# Patient Record
Sex: Female | Born: 1960 | Race: White | Hispanic: No | Smoking: Former smoker
Health system: Southern US, Community
[De-identification: ages and names within clinical notes are randomized; demographics above are authoritative.]

## PROBLEM LIST (undated history)

## (undated) DIAGNOSIS — R0600 Dyspnea, unspecified: Secondary | ICD-10-CM

## (undated) DIAGNOSIS — E785 Hyperlipidemia, unspecified: Secondary | ICD-10-CM

## (undated) DIAGNOSIS — F419 Anxiety disorder, unspecified: Secondary | ICD-10-CM

## (undated) DIAGNOSIS — I10 Essential (primary) hypertension: Secondary | ICD-10-CM

## (undated) DIAGNOSIS — M1990 Unspecified osteoarthritis, unspecified site: Secondary | ICD-10-CM

## (undated) DIAGNOSIS — T7840XA Allergy, unspecified, initial encounter: Secondary | ICD-10-CM

## (undated) DIAGNOSIS — E119 Type 2 diabetes mellitus without complications: Secondary | ICD-10-CM

## (undated) DIAGNOSIS — N83209 Unspecified ovarian cyst, unspecified side: Secondary | ICD-10-CM

## (undated) DIAGNOSIS — F32A Depression, unspecified: Secondary | ICD-10-CM

## (undated) DIAGNOSIS — K219 Gastro-esophageal reflux disease without esophagitis: Secondary | ICD-10-CM

## (undated) DIAGNOSIS — I2510 Atherosclerotic heart disease of native coronary artery without angina pectoris: Secondary | ICD-10-CM

## (undated) DIAGNOSIS — F329 Major depressive disorder, single episode, unspecified: Secondary | ICD-10-CM

## (undated) HISTORY — DX: Hyperlipidemia, unspecified: E78.5

## (undated) HISTORY — DX: Depression, unspecified: F32.A

## (undated) HISTORY — DX: Unspecified ovarian cyst, unspecified side: N83.209

## (undated) HISTORY — PX: COLONOSCOPY: SHX174

## (undated) HISTORY — DX: Allergy, unspecified, initial encounter: T78.40XA

## (undated) HISTORY — DX: Unspecified osteoarthritis, unspecified site: M19.90

## (undated) HISTORY — DX: Major depressive disorder, single episode, unspecified: F32.9

## (undated) HISTORY — DX: Essential (primary) hypertension: I10

## (undated) HISTORY — PX: WISDOM TOOTH EXTRACTION: SHX21

## (undated) HISTORY — DX: Anxiety disorder, unspecified: F41.9

## (undated) HISTORY — PX: JOINT REPLACEMENT: SHX530

---

## 2001-02-23 ENCOUNTER — Encounter: Payer: Self-pay | Admitting: Family Medicine

## 2001-02-23 ENCOUNTER — Encounter: Admission: RE | Admit: 2001-02-23 | Discharge: 2001-02-23 | Payer: Self-pay | Admitting: Family Medicine

## 2005-03-20 ENCOUNTER — Ambulatory Visit: Payer: Self-pay | Admitting: Internal Medicine

## 2005-08-20 ENCOUNTER — Encounter: Admission: RE | Admit: 2005-08-20 | Discharge: 2005-08-20 | Payer: Self-pay | Admitting: Family Medicine

## 2007-01-24 ENCOUNTER — Ambulatory Visit (HOSPITAL_COMMUNITY): Admission: RE | Admit: 2007-01-24 | Discharge: 2007-01-24 | Payer: Self-pay | Admitting: Obstetrics and Gynecology

## 2007-01-24 ENCOUNTER — Encounter (HOSPITAL_COMMUNITY): Payer: Self-pay | Admitting: Obstetrics and Gynecology

## 2008-03-22 ENCOUNTER — Encounter: Admission: RE | Admit: 2008-03-22 | Discharge: 2008-03-22 | Payer: Self-pay | Admitting: Family Medicine

## 2010-01-05 HISTORY — PX: ENDOMETRIAL ABLATION: SHX621

## 2010-01-05 HISTORY — PX: OVARIAN CYST REMOVAL: SHX89

## 2010-05-20 NOTE — Op Note (Signed)
NAMEMARLAYNA, [name]               ACCOUNT NO.:  [id number]   MEDICAL RECORD NO.:  [id number]          PATIENT TYPE:  AMB   LOCATION:  SDC                           FACILITY:  [facility]   PHYSICIAN:  Zelphia Cairo, MD    DATE OF BIRTH:  03-Jan-1961   DATE OF PROCEDURE:  01/24/2007  DATE OF DISCHARGE:                               OPERATIVE REPORT   PREOPERATIVE DIAGNOSES:  1. Irregular menses and menorrhagia.  2. Persistent right ovarian cyst.   POSTOPERATIVE DIAGNOSES:  1. Irregular menses and menorrhagia.  2. Persistent right ovarian cyst.  Pathology pending.   SURGERY:  Laparoscopic right salpingo-oophorectomy, hysteroscopy D&C  with NovaSure ablation, paracervical block.   SURGEON:  Zelphia Cairo, M.D.   ANESTHESIA:  General, local.   COMPLICATIONS:  None.   CONDITION:  Stable and extubated to recovery room.   PROCEDURE:  Ariana [name] was taken to the operating room where general  anesthesia was found to be adequate.  She was placed in the dorsal  lithotomy position using Allen stirrups.  She was prepped and draped in  sterile fashion, and a catheter was used turned to drain her bladder for  approximately 50 mL of clear urine.  Bivalve speculum was placed in the  vagina, and a single-tooth tenaculum was used to grasp the anterior lip  of the cervix.  A Hulka uterine manipulator was then placed.  A single-  toothed tenaculum and speculum were then removed, and our attention was  turned to the abdomen.   An infraumbilical skin incision was made with a scalpel, and this was  extended bluntly to the fascia using a Kelly clamp.  An optical trocar  was then used to enter the peritoneum under direct visualization.  Once  intraperitoneal placement was confirmed, CO2 was turned on, and the  patient was placed in Trendelenburg position.  The bowel was swept out  of the cul-de-sac, and a survey of the pelvis was obtained.  Appendix  and left ovary appeared normal.  The right ovary had a  large simple-  appearing cyst consistent with ultrasound.  A suprapubic incision was  then made with the scalpel ,and a 5-mm trocar was inserted under direct  visualization.  A blunt grasper was then inserted through this trocar,  and the right adnexa was tented upwards.  The right fallopian tube and  ovary were then visualized to be distant from the ureter.  The ureter  was noted to be free of our operative field.  The gyrus was then  inserted through the operative port.  The utero-ovarian ligament was  grasped, cauterized and cut using the gyrus.  This incision was then  extended along the level of the mesosalpinx, staying just adjacent to  the fallopian tube and ovary.  Once the fallopian tube and ovary were  free, they were placed in the anterior cul-de-sac.  The blunt grasper  was then used to grasp the right adnexa, and long laparoscopic scissors  were placed through the operative port.  Small incisions were made  through the ovary and ovarian cyst.  The blunt grasper was then  removed,  and a 10-mm port was then used to replace our 5-mm suprapubic port.  An  EndoCatch bag was placed in the pelvis.  The right adnexa was placed  into the EndoCatch bag, and this was removed through our suprapubic  incision.  This was passed off and sent to pathology.  The pelvis was  then irrigated using the Nezhat, and our pedicle was visualized to be  hemostatic.  All trocars and instruments were then removed from the  abdomen.  Fascial incisions were closed using a figure-of-eight Vicryl  stitch, and the skin was closed with 3-0 Vicryl in a running  subcutaneous stitch.  The abdomen was then covered, and our attention  was then turned to the vagina.   A Hulka manipulator was removed from the cervix.  A speculum was placed  in the vagina, and a single-tooth tenaculum placed on the anterior lip  of the cervix.  The uterus was sounded to 8 cm.  The diagnostic  hysteroscope was inserted into the  uterine cavity, and the uterine  cavity was visualized.  She was found to have small polypoid mass just  inside the cervical internal os.  The uterine cavity otherwise appeared  normal.  The hysteroscope was then removed, and a D&C was performed.  Specimen was placed on Telfa and passed off to be sent to pathology.  The NovaSure device was then inserted into the uterine cavity, and an  ablation was performed using standard manufacturer guidelines.  NovaSure  device was then removed from the uterus.  Cervical block was then  performed.  A single-tooth tenaculum was removed from the cervix.  The  cervix was found to be hemostatic.  The speculum was removed.  The  patient was extubated and taken to the recovery room in stable  condition.      Zelphia Cairo, MD  Electronically Signed     GA/MEDQ  D:  01/24/2007  T:  01/24/2007  Job:  [id number]

## 2010-09-25 LAB — CBC
HCT: 36.7
Hemoglobin: 12.4
MCHC: 33.6
MCV: 79.8
Platelets: 332
RBC: 4.61
RDW: 15.7 — ABNORMAL HIGH
WBC: 7.9

## 2010-09-25 LAB — BASIC METABOLIC PANEL
BUN: 10
CO2: 30
Chloride: 101
Glucose, Bld: 73
Potassium: 3.8

## 2010-09-25 LAB — PREGNANCY, URINE: Preg Test, Ur: NEGATIVE

## 2012-08-28 LAB — HM MAMMOGRAPHY: HM Mammogram: NORMAL (ref 0–4)

## 2012-08-28 LAB — HM PAP SMEAR

## 2015-08-28 ENCOUNTER — Telehealth: Payer: Self-pay | Admitting: Emergency Medicine

## 2015-08-28 ENCOUNTER — Encounter: Payer: Self-pay | Admitting: Emergency Medicine

## 2015-08-28 NOTE — Telephone Encounter (Signed)
Pre-Visit Call completed with patient and chart updated.   Pre-Visit Info documented in Specialty Comments under [employer].    

## 2015-08-29 ENCOUNTER — Ambulatory Visit (INDEPENDENT_AMBULATORY_CARE_PROVIDER_SITE_OTHER): Payer: BC Managed Care – PPO | Admitting: Family Medicine

## 2015-08-29 ENCOUNTER — Encounter: Payer: Self-pay | Admitting: Family Medicine

## 2015-08-29 VITALS — BP 130/82 | HR 83 | Temp 98.1°F | Ht 63.75 in | Wt 254.4 lb

## 2015-08-29 DIAGNOSIS — F418 Other specified anxiety disorders: Secondary | ICD-10-CM | POA: Diagnosis not present

## 2015-08-29 DIAGNOSIS — I10 Essential (primary) hypertension: Secondary | ICD-10-CM | POA: Diagnosis not present

## 2015-08-29 DIAGNOSIS — F32A Depression, unspecified: Secondary | ICD-10-CM | POA: Insufficient documentation

## 2015-08-29 DIAGNOSIS — Z23 Encounter for immunization: Secondary | ICD-10-CM

## 2015-08-29 DIAGNOSIS — F329 Major depressive disorder, single episode, unspecified: Secondary | ICD-10-CM | POA: Insufficient documentation

## 2015-08-29 DIAGNOSIS — E785 Hyperlipidemia, unspecified: Secondary | ICD-10-CM | POA: Diagnosis not present

## 2015-08-29 DIAGNOSIS — F419 Anxiety disorder, unspecified: Secondary | ICD-10-CM

## 2015-08-29 DIAGNOSIS — I519 Heart disease, unspecified: Secondary | ICD-10-CM | POA: Insufficient documentation

## 2015-08-29 DIAGNOSIS — E7800 Pure hypercholesterolemia, unspecified: Secondary | ICD-10-CM | POA: Insufficient documentation

## 2015-08-29 LAB — HEPATIC FUNCTION PANEL
ALK PHOS: 86 U/L (ref 39–117)
ALT: 25 U/L (ref 0–35)
AST: 20 U/L (ref 0–37)
Albumin: 4.4 g/dL (ref 3.5–5.2)
BILIRUBIN DIRECT: 0.1 mg/dL (ref 0.0–0.3)
BILIRUBIN TOTAL: 0.4 mg/dL (ref 0.2–1.2)
TOTAL PROTEIN: 7.4 g/dL (ref 6.0–8.3)

## 2015-08-29 LAB — CBC WITH DIFFERENTIAL/PLATELET
BASOS PCT: 0.3 % (ref 0.0–3.0)
Basophils Absolute: 0 10*3/uL (ref 0.0–0.1)
EOS PCT: 0 % (ref 0.0–5.0)
Eosinophils Absolute: 0 10*3/uL (ref 0.0–0.7)
HCT: 41 % (ref 36.0–46.0)
Hemoglobin: 13.9 g/dL (ref 12.0–15.0)
LYMPHS ABS: 2.2 10*3/uL (ref 0.7–4.0)
Lymphocytes Relative: 26.3 % (ref 12.0–46.0)
MCHC: 33.8 g/dL (ref 30.0–36.0)
MCV: 80.8 fl (ref 78.0–100.0)
MONOS PCT: 7.3 % (ref 3.0–12.0)
Monocytes Absolute: 0.6 10*3/uL (ref 0.1–1.0)
NEUTROS ABS: 5.6 10*3/uL (ref 1.4–7.7)
Neutrophils Relative %: 66.1 % (ref 43.0–77.0)
Platelets: 304 10*3/uL (ref 150.0–400.0)
RBC: 5.07 Mil/uL (ref 3.87–5.11)
RDW: 15.4 % (ref 11.5–15.5)
WBC: 8.5 10*3/uL (ref 4.0–10.5)

## 2015-08-29 LAB — BASIC METABOLIC PANEL
BUN: 15 mg/dL (ref 6–23)
CHLORIDE: 101 meq/L (ref 96–112)
CO2: 30 mEq/L (ref 19–32)
Calcium: 9.6 mg/dL (ref 8.4–10.5)
Creatinine, Ser: 0.75 mg/dL (ref 0.40–1.20)
GFR: 84.68 mL/min (ref >60.00)
Glucose, Bld: 63 mg/dL — ABNORMAL LOW (ref 70–99)
POTASSIUM: 3.6 meq/L (ref 3.5–5.1)
Sodium: 141 mEq/L (ref 135–145)

## 2015-08-29 LAB — LIPID PANEL
CHOL/HDL RATIO: 4
CHOLESTEROL: 176 mg/dL (ref 0–200)
HDL: 47.8 mg/dL (ref >39.00)
NonHDL: 128.61
Triglycerides: 223 mg/dL — ABNORMAL HIGH (ref 0.0–149.0)
VLDL: 44.6 mg/dL — ABNORMAL HIGH (ref 0.0–40.0)

## 2015-08-29 LAB — HEMOGLOBIN A1C: HEMOGLOBIN A1C: 5.3 % (ref 4.6–6.5)

## 2015-08-29 LAB — LDL CHOLESTEROL, DIRECT: Direct LDL: 99 mg/dL

## 2015-08-29 LAB — TSH: TSH: 1.32 u[IU]/mL (ref 0.35–4.50)

## 2015-08-29 MED ORDER — SIMVASTATIN 40 MG PO TABS: 40.0000 mg | ORAL_TABLET | Freq: Every day | ORAL | 1 refills | Status: DC

## 2015-08-29 MED ORDER — ALPRAZOLAM 0.5 MG PO TABS: 0.5000 mg | ORAL_TABLET | Freq: Two times a day (BID) | ORAL | 1 refills | Status: DC | PRN

## 2015-08-29 MED ORDER — BUPROPION HCL ER (XL) 300 MG PO TB24: 300.0000 mg | ORAL_TABLET | Freq: Every day | ORAL | 1 refills | Status: DC

## 2015-08-29 MED ORDER — CITALOPRAM HYDROBROMIDE 20 MG PO TABS: 20.0000 mg | ORAL_TABLET | Freq: Every day | ORAL | 3 refills | Status: DC

## 2015-08-29 MED ORDER — AMLODIPINE BESYLATE 5 MG PO TABS: 5.0000 mg | ORAL_TABLET | Freq: Every day | ORAL | 1 refills | Status: DC

## 2015-08-29 MED ORDER — HYDROCHLOROTHIAZIDE 25 MG PO TABS: 25.0000 mg | ORAL_TABLET | Freq: Every day | ORAL | 1 refills | Status: DC

## 2015-08-29 NOTE — Progress Notes (Signed)
   Subjective:    Patient ID: Ariana [name], female    DOB: 06/16/1960, 55 y.o.   MRN: [id number]  HPI New to establish.  Previous MD- [place], due for pap/mammo  HTN- chronic problem, on Amlodipine and HCTZ daily w/ adequate control of BP.  No CP, SOB, HAs, visual changes, edema.  Hyperlipidemia- chronic problem, on Simvastatin.  No abd pain, N/V.  Anxiety/depression- chronic problem, sxs started after her daughter died at age 98 in [place].  Currently on Wellbutrin and Alprazolam prn.  Struggling w/ mom's ongoing dementia.  Pt finds herself getting angry w/ mother.  Increased irritability.  Was on Cymbalta previously w/ no results.    Obesity- chronic problem, pt's BMI is now 41.  Pt has lost 40 lbs in the last year.  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  Pt is attempting Flexogenix prior to surgery.   Review of Systems For ROS see HPI     Objective:   Physical Exam  Constitutional: She is oriented to person, place, and time. She appears well-developed and well-nourished. No distress.  obese  HENT:  Head: Normocephalic and atraumatic.  Eyes: Conjunctivae and EOM are normal. Pupils are equal, round, and reactive to light.  Neck: Normal range of motion. Neck supple. No thyromegaly present.  Cardiovascular: Normal rate, regular rhythm, normal heart sounds and intact distal pulses.   No murmur heard. Pulmonary/Chest: Effort normal and breath sounds normal. No respiratory distress.  Abdominal: Soft. She exhibits no distension. There is no tenderness.  Musculoskeletal: She exhibits no edema.  Lymphadenopathy:    She has no cervical adenopathy.  Neurological: She is alert and oriented to person, place, and time.  Skin: Skin is warm and dry.  Psychiatric: She has a normal mood and affect. Her behavior is normal.  Vitals reviewed.         Assessment & Plan:

## 2015-08-29 NOTE — Patient Instructions (Signed)
Follow up in 4-6 weeks to recheck anxiety We'll notify you of your lab results and make any changes if needed Continue to work on healthy diet and regular exercise- you can do it! Call and schedule an appt w/ Dr Julien Girt Start the Celexa once daily in addition to the Wellbutrin Call with any questions or concerns Welcome!  We're glad to have you!!!

## 2015-08-29 NOTE — Progress Notes (Signed)
Pre visit review using our clinic review tool, if applicable. No additional management support is needed unless otherwise documented below in the visit note. 

## 2015-08-30 ENCOUNTER — Encounter: Payer: Self-pay | Admitting: General Practice

## 2015-09-01 NOTE — Assessment & Plan Note (Signed)
New to provider, ongoing for pt since death of her daughter in 04-25-1999.  sxs have worsened w/ mom's dementia (pt is primary caregiver).  Add Celexa to pt's current Wellbutrin and monitor for improvement.  Pt expressed understanding and is in agreement w/ plan.

## 2015-09-01 NOTE — Assessment & Plan Note (Signed)
New to provider, ongoing for pt.  BP is well controlled today.  Asymptomatic.  Check labs.  No anticipated med changes.  Will follow.

## 2015-09-01 NOTE — Assessment & Plan Note (Signed)
New to provider, ongoing for pt.  She has recently lost 40 lbs and is attempting to lose more.  Applauded her efforts.  Check labs to risk stratify.  Will continue to follow.

## 2015-09-01 NOTE — Assessment & Plan Note (Signed)
New to provider, ongoing for pt.  Tolerating statin w/o difficulty.  Stressed need for healthy diet and regular exercise.  Check labs.  Adjust meds prn  

## 2015-09-30 ENCOUNTER — Ambulatory Visit: Payer: BC Managed Care – PPO | Admitting: Family Medicine

## 2015-10-03 ENCOUNTER — Ambulatory Visit (INDEPENDENT_AMBULATORY_CARE_PROVIDER_SITE_OTHER): Payer: BC Managed Care – PPO | Admitting: Family Medicine

## 2015-10-03 ENCOUNTER — Encounter: Payer: Self-pay | Admitting: Family Medicine

## 2015-10-03 VITALS — BP 122/82 | HR 63 | Temp 98.1°F | Resp 16 | Ht 64.0 in | Wt 258.4 lb

## 2015-10-03 DIAGNOSIS — F329 Major depressive disorder, single episode, unspecified: Secondary | ICD-10-CM

## 2015-10-03 DIAGNOSIS — F418 Other specified anxiety disorders: Secondary | ICD-10-CM

## 2015-10-03 DIAGNOSIS — F419 Anxiety disorder, unspecified: Principal | ICD-10-CM

## 2015-10-03 MED ORDER — FLUOXETINE HCL 20 MG PO TABS: 20.0000 mg | ORAL_TABLET | Freq: Every day | ORAL | 3 refills | Status: DC

## 2015-10-03 NOTE — Progress Notes (Signed)
Pre visit review using our clinic review tool, if applicable. No additional management support is needed unless otherwise documented below in the visit note. 

## 2015-10-03 NOTE — Patient Instructions (Signed)
Follow up in 4-6 weeks to recheck mood and weight STOP the Celexa START the Prozac once daily Keep up the good work!  You look great!!! Call with any questions or concerns Happy Bel[employer] Birthday!!!

## 2015-10-03 NOTE — Assessment & Plan Note (Signed)
Improved since starting Celexa but pt is concerned about increased appetite and weight gain.  Based on this, will switch to Prozac and monitor for improvement.  Pt expressed understanding and is in agreement w/ plan.

## 2015-10-03 NOTE — Progress Notes (Signed)
   Subjective:    Patient ID: Ariana [name], female    DOB: Jan 15, 1960, 55 y.o.   MRN: [id number]  HPI Anxiety- ongoing issue.  Celexa was added to pt's Wellbutrin at last visit.  Pt reports she is feeling 'better'.  Pt reports increased tolerance, longer fuse when dealing w/ mother's dementia.  The only downside is increased appetite.     Review of Systems For ROS see HPI     Objective:   Physical Exam  Constitutional: She is oriented to person, place, and time. She appears well-developed and well-nourished. No distress.  HENT:  Head: Normocephalic and atraumatic.  Neurological: She is alert and oriented to person, place, and time.  Skin: Skin is warm and dry.  Psychiatric: She has a normal mood and affect. Her behavior is normal. Thought content normal.  Vitals reviewed.         Assessment & Plan:

## 2015-10-31 ENCOUNTER — Encounter: Payer: Self-pay | Admitting: Family Medicine

## 2015-10-31 ENCOUNTER — Ambulatory Visit (INDEPENDENT_AMBULATORY_CARE_PROVIDER_SITE_OTHER): Payer: BC Managed Care – PPO | Admitting: Family Medicine

## 2015-10-31 VITALS — BP 122/80 | HR 72 | Temp 98.3°F | Resp 16 | Ht 64.0 in | Wt 258.0 lb

## 2015-10-31 DIAGNOSIS — J302 Other seasonal allergic rhinitis: Secondary | ICD-10-CM | POA: Diagnosis not present

## 2015-10-31 DIAGNOSIS — F418 Other specified anxiety disorders: Secondary | ICD-10-CM

## 2015-10-31 DIAGNOSIS — F329 Major depressive disorder, single episode, unspecified: Secondary | ICD-10-CM

## 2015-10-31 DIAGNOSIS — J309 Allergic rhinitis, unspecified: Secondary | ICD-10-CM | POA: Insufficient documentation

## 2015-10-31 DIAGNOSIS — F419 Anxiety disorder, unspecified: Principal | ICD-10-CM

## 2015-10-31 MED ORDER — FLUOXETINE HCL 40 MG PO CAPS: 40.0000 mg | ORAL_CAPSULE | Freq: Every day | ORAL | 3 refills | Status: DC

## 2015-10-31 NOTE — Assessment & Plan Note (Signed)
Pt has not had any additional weight gain since switching to Prozac.  Will increase to 40mg  and monitor for continued improvement

## 2015-10-31 NOTE — Progress Notes (Signed)
   Subjective:    Patient ID: Ariana [name], female    DOB: 07-24-1960, 55 y.o.   MRN: [id number]  HPI Anxiety/depression- ongoing issue for pt.  Pt has not had any weight gain since stopping the Celexa and switching to the Prozac.  Pt feels mood is 'about the same' as it was on the Celexa.  'i'm handling situations better'.  Pt is open to idea of increasing dose for additional improvement  URI- sxs started Tuesday w/ sore throat.  Yesterday developed laryngitis and didn't feel well.  Today is feeling better but voice has yet to recover.  No fevers.  No sinus pain/pressure.     Review of Systems For ROS see HPI     Objective:   Physical Exam  Constitutional: She is oriented to person, place, and time. She appears well-developed and well-nourished. No distress.  HENT:  Head: Normocephalic and atraumatic.  Right Ear: Tympanic membrane normal.  Left Ear: Tympanic membrane normal.  Nose: Mucosal edema and rhinorrhea present. Right sinus exhibits no maxillary sinus tenderness and no frontal sinus tenderness. Left sinus exhibits no maxillary sinus tenderness and no frontal sinus tenderness.  Mouth/Throat: Mucous membranes are normal. Posterior oropharyngeal erythema (w/ PND) present.  Eyes: Conjunctivae and EOM are normal. Pupils are equal, round, and reactive to light.  Neck: Normal range of motion. Neck supple.  Cardiovascular: Normal rate, regular rhythm and normal heart sounds.   Pulmonary/Chest: Effort normal and breath sounds normal. No respiratory distress. She has no wheezes. She has no rales.  Lymphadenopathy:    She has no cervical adenopathy.  Neurological: She is alert and oriented to person, place, and time.  Psychiatric: She has a normal mood and affect. Her behavior is normal. Thought content normal.  Vitals reviewed.         Assessment & Plan:

## 2015-10-31 NOTE — Assessment & Plan Note (Signed)
New.  Pt's sxs and PE consistent w/ seasonal allergies.  Start OTC antihistamine daily.  Reviewed supportive care and red flags that should prompt return.  Pt expressed understanding and is in agreement w/ plan.

## 2015-10-31 NOTE — Progress Notes (Signed)
Pre visit review using our clinic review tool, if applicable. No additional management support is needed unless otherwise documented below in the visit note. 

## 2015-10-31 NOTE — Patient Instructions (Addendum)
Follow up by phone or MyChart in 4-6 weeks and let me know how you're doing on the new dose Schedule your complete physical for late Feb/Early March Increase the Prozac to 40mg  daily- 2 of what you have at home and 1 of the new prescription Add daily Claritin or Zyrtec for the seasonal allergy component Drink plenty of fluids Call with any questions or concerns Happy Fall!!!

## 2016-03-05 ENCOUNTER — Ambulatory Visit (INDEPENDENT_AMBULATORY_CARE_PROVIDER_SITE_OTHER): Payer: BC Managed Care – PPO | Admitting: Family Medicine

## 2016-03-05 ENCOUNTER — Encounter: Payer: Self-pay | Admitting: Family Medicine

## 2016-03-05 VITALS — BP 122/80 | HR 62 | Temp 98.2°F | Resp 16 | Ht 64.0 in | Wt 263.2 lb

## 2016-03-05 DIAGNOSIS — Z01419 Encounter for gynecological examination (general) (routine) without abnormal findings: Secondary | ICD-10-CM

## 2016-03-05 DIAGNOSIS — Z1211 Encounter for screening for malignant neoplasm of colon: Secondary | ICD-10-CM | POA: Diagnosis not present

## 2016-03-05 DIAGNOSIS — Z1159 Encounter for screening for other viral diseases: Secondary | ICD-10-CM | POA: Diagnosis not present

## 2016-03-05 DIAGNOSIS — Z0000 Encounter for general adult medical examination without abnormal findings: Secondary | ICD-10-CM | POA: Diagnosis not present

## 2016-03-05 LAB — LIPID PANEL
Cholesterol: 168 mg/dL (ref <200)
HDL: 48 mg/dL — AB (ref >50)
LDL CALC: 83 mg/dL (ref <100)
Total CHOL/HDL Ratio: 3.5 Ratio (ref <5.0)
Triglycerides: 184 mg/dL — ABNORMAL HIGH (ref <150)
VLDL: 37 mg/dL — ABNORMAL HIGH (ref <30)

## 2016-03-05 LAB — BASIC METABOLIC PANEL
BUN: 17 mg/dL (ref 7–25)
CALCIUM: 9.4 mg/dL (ref 8.6–10.4)
CHLORIDE: 104 mmol/L (ref 98–110)
CO2: 25 mmol/L (ref 20–31)
Creat: 0.96 mg/dL (ref 0.50–1.05)
GLUCOSE: 65 mg/dL (ref 65–99)
POTASSIUM: 3.7 mmol/L (ref 3.5–5.3)
SODIUM: 140 mmol/L (ref 135–146)

## 2016-03-05 LAB — CBC WITH DIFFERENTIAL/PLATELET
BASOS ABS: 0 {cells}/uL (ref 0–200)
BASOS PCT: 0 %
EOS ABS: 0 {cells}/uL — AB (ref 15–500)
Eosinophils Relative: 0 %
HEMATOCRIT: 40.5 % (ref 35.0–45.0)
Hemoglobin: 13.3 g/dL (ref 11.7–15.5)
LYMPHS PCT: 24 %
Lymphs Abs: 1896 cells/uL (ref 850–3900)
MCH: 26.6 pg — ABNORMAL LOW (ref 27.0–33.0)
MCHC: 32.8 g/dL (ref 32.0–36.0)
MCV: 81 fL (ref 80.0–100.0)
MONOS PCT: 8 %
MPV: 10.1 fL (ref 7.5–12.5)
Monocytes Absolute: 632 cells/uL (ref 200–950)
Neutro Abs: 5372 cells/uL (ref 1500–7800)
Neutrophils Relative %: 68 %
PLATELETS: 269 10*3/uL (ref 140–400)
RBC: 5 MIL/uL (ref 3.80–5.10)
RDW: 15 % (ref 11.0–15.0)
WBC: 7.9 10*3/uL (ref 3.8–10.8)

## 2016-03-05 LAB — HEPATIC FUNCTION PANEL
ALK PHOS: 75 U/L (ref 33–130)
ALT: 25 U/L (ref 6–29)
AST: 30 U/L (ref 10–35)
Albumin: 4 g/dL (ref 3.6–5.1)
BILIRUBIN INDIRECT: 0.3 mg/dL (ref 0.2–1.2)
Bilirubin, Direct: 0.1 mg/dL (ref <=0.2)
TOTAL PROTEIN: 7 g/dL (ref 6.1–8.1)
Total Bilirubin: 0.4 mg/dL (ref 0.2–1.2)

## 2016-03-05 LAB — TSH: TSH: 2.63 mIU/L

## 2016-03-05 NOTE — Patient Instructions (Signed)
Follow up in 6 months to recheck BP and cholesterol We'll notify you of your lab results and make any changes if needed We'll call you with your GI and GYN appts for the colonoscopy and the pap/mammo Continue to work on healthy diet and regular exercise- you can do it!!! Call with any questions or concerns Happy Spring!!!

## 2016-03-05 NOTE — Progress Notes (Signed)
   Subjective:    Patient ID: Ariana [name], female    DOB: 1960-06-06, 56 y.o.   MRN: [id number]  HPI CPE- due for pap and mammo (sees Dr Julien Girt).  Overdue for colonoscopy   Review of Systems Patient reports no vision/ hearing changes, adenopathy,fever, weight change,  persistant/recurrent hoarseness , swallowing issues, chest pain, palpitations, edema, persistant/recurrent cough, hemoptysis, dyspnea (rest/exertional/paroxysmal nocturnal), gastrointestinal bleeding (melena, rectal bleeding), abdominal pain, significant heartburn, bowel changes, GU symptoms (dysuria, hematuria, incontinence), Gyn symptoms (abnormal  bleeding, pain),  syncope, focal weakness, memory loss, numbness & tingling, skin/hair/nail changes, abnormal bruising or bleeding, anxiety, or depression.     Objective:   Physical Exam General Appearance:    Alert, cooperative, no distress, appears stated age, morbidly obese  Head:    Normocephalic, without obvious abnormality, atraumatic  Eyes:    PERRL, conjunctiva/corneas clear, EOM's intact, fundi    benign, both eyes  Ears:    Normal TM's and external ear canals, both ears  Nose:   Nares normal, septum midline, mucosa normal, no drainage    or sinus tenderness  Throat:   Lips, mucosa, and tongue normal; teeth and gums normal  Neck:   Supple, symmetrical, trachea midline, no adenopathy;    Thyroid: no enlargement/tenderness/nodules  Back:     Symmetric, no curvature, ROM normal, no CVA tenderness  Lungs:     Clear to auscultation bilaterally, respirations unlabored  Chest Wall:    No tenderness or deformity   Heart:    Regular rate and rhythm, S1 and S2 normal, no murmur, rub   or gallop  Breast Exam:    Deferred to GYN  Abdomen:     Soft, non-tender, bowel sounds active all four quadrants,    no masses, no organomegaly  Genitalia:    Deferred to GYN  Rectal:    Extremities:   Extremities normal, atraumatic, no cyanosis or edema  Pulses:   2+ and symmetric all  extremities  Skin:   Skin color, texture, turgor normal, no rashes or lesions  Lymph nodes:   Cervical, supraclavicular, and axillary nodes normal  Neurologic:   CNII-XII intact, normal strength, sensation and reflexes    throughout          Assessment & Plan:

## 2016-03-05 NOTE — Progress Notes (Signed)
Pre visit review using our clinic review tool, if applicable. No additional management support is needed unless otherwise documented below in the visit note. 

## 2016-03-05 NOTE — Assessment & Plan Note (Signed)
Pt's PE WNL w/ exception of morbid obesity.  Stressed need for healthy diet and regular exercise.  Overdue for colonoscopy- GI referral placed.  Due for pap and mammo- GYN referral placed as pt sees Dr Julien Girt.  Check labs.  Anticipatory guidance provided.

## 2016-03-06 ENCOUNTER — Other Ambulatory Visit: Payer: Self-pay | Admitting: General Practice

## 2016-03-06 LAB — VITAMIN D 25 HYDROXY (VIT D DEFICIENCY, FRACTURES): Vit D, 25-Hydroxy: 12 ng/mL — ABNORMAL LOW (ref 30–100)

## 2016-03-06 LAB — HEPATITIS C ANTIBODY: HCV Ab: NEGATIVE

## 2016-03-06 MED ORDER — VITAMIN D (ERGOCALCIFEROL) 1.25 MG (50000 UNIT) PO CAPS: 50000.0000 [IU] | ORAL_CAPSULE | ORAL | 0 refills | Status: DC

## 2016-03-09 ENCOUNTER — Encounter: Payer: Self-pay | Admitting: Gastroenterology

## 2016-03-10 ENCOUNTER — Other Ambulatory Visit: Payer: Self-pay | Admitting: Family Medicine

## 2016-03-10 NOTE — Telephone Encounter (Signed)
Last OV 03/05/16 Alprazolam last filled 08/29/15 #60 with 1

## 2016-03-10 NOTE — Telephone Encounter (Signed)
Medication filled to pharmacy as requested.   

## 2016-03-16 ENCOUNTER — Encounter: Payer: BC Managed Care – PPO | Admitting: Family Medicine

## 2016-04-02 ENCOUNTER — Other Ambulatory Visit: Payer: Self-pay | Admitting: Obstetrics and Gynecology

## 2016-04-02 DIAGNOSIS — R928 Other abnormal and inconclusive findings on diagnostic imaging of breast: Secondary | ICD-10-CM

## 2016-04-08 ENCOUNTER — Ambulatory Visit
Admission: RE | Admit: 2016-04-08 | Discharge: 2016-04-08 | Disposition: A | Discharge status: Home / Self Care | Payer: BC Managed Care – PPO | Source: Ambulatory Visit | Attending: Obstetrics and Gynecology | Admitting: Obstetrics and Gynecology

## 2016-04-08 DIAGNOSIS — R928 Other abnormal and inconclusive findings on diagnostic imaging of breast: Secondary | ICD-10-CM

## 2016-05-04 ENCOUNTER — Encounter: Payer: BC Managed Care – PPO | Admitting: Gastroenterology

## 2016-05-26 ENCOUNTER — Encounter: Payer: Self-pay | Admitting: Gastroenterology

## 2016-05-26 ENCOUNTER — Ambulatory Visit (AMBULATORY_SURGERY_CENTER): Payer: Self-pay

## 2016-05-26 VITALS — Ht 63.0 in | Wt 264.4 lb

## 2016-05-26 DIAGNOSIS — Z1211 Encounter for screening for malignant neoplasm of colon: Secondary | ICD-10-CM

## 2016-05-26 MED ORDER — NA SULFATE-K SULFATE-MG SULF 17.5-3.13-1.6 GM/177ML PO SOLN: ORAL | 0 refills | Status: DC

## 2016-05-26 NOTE — Progress Notes (Signed)
Per pt, no allergies to soy or egg products.Pt not taking any weight loss meds or using  O2 at home.  Pr refused Emmi video.

## 2016-06-09 ENCOUNTER — Ambulatory Visit (AMBULATORY_SURGERY_CENTER): Payer: BC Managed Care – PPO | Admitting: Gastroenterology

## 2016-06-09 ENCOUNTER — Encounter: Payer: Self-pay | Admitting: Gastroenterology

## 2016-06-09 VITALS — BP 122/65 | HR 67 | Temp 97.7°F | Resp 19 | Ht 63.0 in | Wt 264.0 lb

## 2016-06-09 DIAGNOSIS — Z1212 Encounter for screening for malignant neoplasm of rectum: Secondary | ICD-10-CM

## 2016-06-09 DIAGNOSIS — K649 Unspecified hemorrhoids: Secondary | ICD-10-CM | POA: Diagnosis not present

## 2016-06-09 DIAGNOSIS — Z1211 Encounter for screening for malignant neoplasm of colon: Secondary | ICD-10-CM | POA: Diagnosis present

## 2016-06-09 DIAGNOSIS — D124 Benign neoplasm of descending colon: Secondary | ICD-10-CM | POA: Diagnosis not present

## 2016-06-09 MED ORDER — SODIUM CHLORIDE 0.9 % IV SOLN: 500.0000 mL | INTRAVENOUS | Status: DC

## 2016-06-09 NOTE — Progress Notes (Signed)
A and O x3. Report to RN. Tolerated MAC anesthesia well.

## 2016-06-09 NOTE — Patient Instructions (Signed)
HANDOUTS GIVEN FOR POLYPS AND HEMORRHOIDS  YOU HAD AN ENDOSCOPIC PROCEDURE TODAY AT THE [facility]:   Refer to the procedure report that was given to you for any specific questions about what was found during the examination.  If the procedure report does not answer your questions, please call your gastroenterologist to clarify.  If you requested that your care partner not be given the details of your procedure findings, then the procedure report has been included in a sealed envelope for you to review at your convenience later.  YOU SHOULD EXPECT: Some feelings of bloating in the abdomen. Passage of more gas than usual.  Walking can help get rid of the air that was put into your GI tract during the procedure and reduce the bloating. If you had a lower endoscopy (such as a colonoscopy or flexible sigmoidoscopy) you may notice spotting of blood in your stool or on the toilet paper. If you underwent a bowel prep for your procedure, you may not have a normal bowel movement for a few days.  Please Note:  You might notice some irritation and congestion in your nose or some drainage.  This is from the oxygen used during your procedure.  There is no need for concern and it should clear up in a day or so.  SYMPTOMS TO REPORT IMMEDIATELY:   Following lower endoscopy (colonoscopy or flexible sigmoidoscopy):  Excessive amounts of blood in the stool  Significant tenderness or worsening of abdominal pains  Swelling of the abdomen that is new, acute  Fever of 100F or higher   For urgent or emergent issues, a gastroenterologist can be reached at any hour by calling [phone].   DIET:  We do recommend a small meal at first, but then you may proceed to your regular diet.  Drink plenty of fluids but you should avoid alcoholic beverages for 24 hours.  ACTIVITY:  You should plan to take it easy for the rest of today and you should NOT DRIVE or use heavy machinery until tomorrow (because of the  sedation medicines used during the test).    FOLLOW UP: Our staff will call the number listed on your records the next business day following your procedure to check on you and address any questions or concerns that you may have regarding the information given to you following your procedure. If we do not reach you, we will leave a message.  However, if you are feeling well and you are not experiencing any problems, there is no need to return our call.  We will assume that you have returned to your regular daily activities without incident.  If any biopsies were taken you will be contacted by phone or by letter within the next 1-3 weeks.  Please call us at [phone] if you have not heard about the biopsies in 3 weeks.    SIGNATURES/CONFIDENTIALITY: You and/or your care partner have signed paperwork which will be entered into your electronic medical record.  These signatures attest to the fact that that the information above on your After Visit Summary has been reviewed and is understood.  Full responsibility of the confidentiality of this discharge information lies with you and/or your care-partner. 

## 2016-06-09 NOTE — Progress Notes (Signed)
Called to room to assist during endoscopic procedure.  Patient ID and intended procedure confirmed with present staff. Received instructions for my participation in the procedure from the performing physician.  

## 2016-06-09 NOTE — Op Note (Signed)
[facility] Patient Name: Ariana [name] Procedure Date: 06/09/2016 8:43 AM MRN: [id number] Endoscopist: Milus Banister , MD Age: 56 Referring MD:  Date of Birth: [birth year] Gender: Female Account #: [id number] Procedure:                Colonoscopy Indications:              Screening for colorectal malignant neoplasm Medicines:                Monitored Anesthesia Care Procedure:                Pre-Anesthesia Assessment:                           - Prior to the procedure, a History and Physical                            was performed, and patient medications and                            allergies were reviewed. The patient's tolerance of                            previous anesthesia was also reviewed. The risks                            and benefits of the procedure and the sedation                            options and risks were discussed with the patient.                            All questions were answered, and informed consent                            was obtained. Prior Anticoagulants: The patient has                            taken no previous anticoagulant or antiplatelet                            agents. ASA Grade Assessment: III - A patient with                            severe systemic disease. After reviewing the risks                            and benefits, the patient was deemed in                            satisfactory condition to undergo the procedure.                           After obtaining informed consent, the colonoscope  was passed under direct vision. Throughout the                            procedure, the patient's blood pressure, pulse, and                            oxygen saturations were monitored continuously. The                            Colonoscope was introduced through the anus and                            advanced to the the cecum, identified by                            appendiceal orifice and  ileocecal valve. The                            colonoscopy was performed without difficulty. The                            patient tolerated the procedure well. The quality                            of the bowel preparation was excellent. The                            ileocecal valve, appendiceal orifice, and rectum                            were photographed. Scope In: 8:50:39 AM Scope Out: 9:03:29 AM Scope Withdrawal Time: 0 hours 10 minutes 43 seconds  Total Procedure Duration: 0 hours 12 minutes 50 seconds  Findings:                 A 5 mm polyp was found in the descending colon. The                            polyp was sessile. The polyp was removed with a                            cold snare. Resection and retrieval were complete.                           Internal hemorrhoids were found. The hemorrhoids                            were small.                           The exam was otherwise without abnormality on                            direct and retroflexion views. Complications:  No immediate complications. Estimated blood loss:                            None. Estimated Blood Loss:     Estimated blood loss: none. Impression:               - One 5 mm polyp in the descending colon, removed                            with a cold snare. Resected and retrieved.                           - Internal hemorrhoids.                           - The examination was otherwise normal on direct                            and retroflexion views. Recommendation:           - Patient has a contact number available for                            emergencies. The signs and symptoms of potential                            delayed complications were discussed with the                            patient. Return to normal activities tomorrow.                            Written discharge instructions were provided to the                            patient.                           -  Resume previous diet.                           - Continue present medications.                           You will receive a letter within 2-3 weeks with the                            pathology results and my final recommendations.                           If the polyp(s) is proven to be 'pre-cancerous' on                            pathology, you will need repeat colonoscopy in 5  years. If the polyp(s) is NOT 'precancerous' on                            pathology then you should repeat colon cancer                            screening in 10 years with colonoscopy without need                            for colon cancer screening by any method prior to                            then (including stool testing). Milus Banister, MD 06/09/2016 9:08:12 AM This report has been signed electronically.

## 2016-06-10 ENCOUNTER — Telehealth: Payer: Self-pay | Admitting: *Deleted

## 2016-06-10 NOTE — Telephone Encounter (Signed)
  Follow up Call-  Call back number 06/09/2016  Post procedure Call Back phone  # [phone]  Permission to leave phone message Yes  Some recent data might be hidden     Patient questions:  Do you have a fever, pain , or abdominal swelling? No. Pain Score  0 *  Have you tolerated food without any problems? Yes.    Have you been able to return to your normal activities? Yes.    Do you have any questions about your discharge instructions: Diet   No. Medications  No. Follow up visit  No.  Do you have questions or concerns about your Care? No.  Actions: * If pain score is 4 or above: No action needed, pain <4.

## 2016-06-16 ENCOUNTER — Encounter: Payer: Self-pay | Admitting: Gastroenterology

## 2016-07-22 LAB — HM MAMMOGRAPHY: HM MAMMO: NORMAL (ref 0–4)

## 2016-07-22 LAB — HM PAP SMEAR

## 2016-07-28 ENCOUNTER — Encounter: Payer: Self-pay | Admitting: Physician Assistant

## 2016-07-28 ENCOUNTER — Ambulatory Visit (INDEPENDENT_AMBULATORY_CARE_PROVIDER_SITE_OTHER): Payer: BC Managed Care – PPO | Admitting: Physician Assistant

## 2016-07-28 VITALS — BP 130/72 | HR 77 | Temp 98.9°F | Resp 14 | Ht 63.0 in | Wt 271.0 lb

## 2016-07-28 DIAGNOSIS — L509 Urticaria, unspecified: Secondary | ICD-10-CM | POA: Diagnosis not present

## 2016-07-28 DIAGNOSIS — R609 Edema, unspecified: Secondary | ICD-10-CM | POA: Diagnosis not present

## 2016-07-28 LAB — T4, FREE: FREE T4: 0.94 ng/dL (ref 0.60–1.60)

## 2016-07-28 LAB — TSH: TSH: 2.98 u[IU]/mL (ref 0.35–4.50)

## 2016-07-28 MED ORDER — FUROSEMIDE 20 MG PO TABS: 20.0000 mg | ORAL_TABLET | Freq: Every day | ORAL | 0 refills | Status: DC

## 2016-07-28 NOTE — Progress Notes (Signed)
Patient presents to clinic today c/o hives/welts of upper chest, upper back and neck that have been intermittent over the past couple of weeks. Notes can happen at any time of the day, regardless of where she is or what she is doing. Hives are very prutiric and resolve within 30 minutes to an hour. Denies change to foods, soaps/lotions/detergents, environment. Denies pets. Denies recent travel or sick contacts. Has also noted some swelling in her legs mainly towards the ned of her day. Has had a couple of days of weeping of skin noted. Denies pain or fever in the area. Denies having fever, chills, malaise or fatigue. Notes eating fast food 2-3 x day as she has been very busy helping to take care of her mother which leaves little time for herself. Denies chest pain, SOB, PND or orthopnea. Is taking chronic medications as directed.  Past Medical History:  Diagnosis Date  . Anxiety   . Depression   . Hyperlipidemia   . Hypertension   . OA (osteoarthritis)    in Bil knees/ gets steroid injections for pain  . Ovarian cyst    right ovary/had ablation    Current Outpatient Prescriptions on File Prior to Visit  Medication Sig Dispense Refill  . ALPRAZolam (XANAX) 0.5 MG tablet TAKE 1 TABLET TWICE DAILY AS NEEDED FOR ANXIETY 60 tablet 1  . amLODipine (NORVASC) 5 MG tablet TAKE 1 TABLET (5 MG TOTAL) BY MOUTH DAILY. 90 tablet 1  . buPROPion (WELLBUTRIN XL) 300 MG 24 hr tablet TAKE 1 TABLET (300 MG TOTAL) BY MOUTH DAILY. 90 tablet 1  . FLUoxetine (PROZAC) 40 MG capsule TAKE 1 CAPSULE (40 MG TOTAL) BY MOUTH DAILY. 30 capsule 3  . hydrochlorothiazide (HYDRODIURIL) 25 MG tablet TAKE 1 TABLET (25 MG TOTAL) BY MOUTH DAILY. 90 tablet 1  . ibuprofen (ADVIL,MOTRIN) 200 MG tablet Take 200 mg by mouth as needed.    . Naproxen Sodium (ALEVE PO) Take by mouth as needed.    . simvastatin (ZOCOR) 40 MG tablet TAKE 1 TABLET (40 MG TOTAL) BY MOUTH DAILY. 90 tablet 1   No current facility-administered medications on  file prior to visit.     Allergies  Allergen Reactions  . Penicillins Hives    Family History  Problem Relation Age of Onset  . Dementia Mother   . Heart attack Father   . Heart disease Father   . Arthritis Father   . Hyperlipidemia Father   . Hypertension Father   . Diabetes Brother   . Arthritis Brother   . Heart disease Brother   . Liver disease Brother     Social History   Social History  . Marital status: Legally Separated    Spouse name: N/A  . Number of children: N/A  . Years of education: N/A   Social History Main Topics  . Smoking status: Never Smoker  . Smokeless tobacco: Never Used  . Alcohol use No  . Drug use: No  . Sexual activity: No   Other Topics Concern  . None   Social History Narrative  . None   Review of Systems - See HPI.  All other ROS are negative.  BP 130/72   Pulse 77   Temp 98.9 F (37.2 C) (Oral)   Resp 14   Ht 5\' 3"  (1.6 m)   Wt 271 lb (122.9 kg)   SpO2 97%   BMI 48.01 kg/m   Physical Exam  Constitutional: She is oriented to person, place, and time  and well-developed, well-nourished, and in no distress.  HENT:  Head: Normocephalic and atraumatic.  Eyes: Conjunctivae are normal.  Neck: Neck supple.  Cardiovascular: Normal rate, regular rhythm, normal heart sounds and intact distal pulses.   Pulses:      Dorsalis pedis pulses are 2+ on the right side, and 2+ on the left side.       Posterior tibial pulses are 2+ on the right side, and 2+ on the left side.  1+ pitting edema of extremities bilaterally from shins down to feet.   Pulmonary/Chest: Effort normal and breath sounds normal. No respiratory distress. She has no wheezes. She has no rales. She exhibits no tenderness.  Neurological: She is alert and oriented to person, place, and time.  Skin: Skin is warm and dry. No rash noted.  Vitals reviewed.  Assessment/Plan: 1. Peripheral edema Will check TSH and Free T4 to verify stable thyroid function. Most likely related  to very poor diet (fast food 2-3 x day). Start low salt diet. Elevation of legs. Continue chronic medication regimen. Add on Lasix 20 mg QD x 7 days. Increase potassium intake. Follow-up 1 week.  - TSH - T4, free - furosemide (LASIX) 20 MG tablet; Take 1 tablet (20 mg total) by mouth daily.  Dispense: 7 tablet; Refill: 0  2. Urticaria None presently. Concern for allergic versus idiopathic urticaria. Start claritin AM and Benadryl PM. Patient to keep log of foods, lotions and symptoms. Follow-up 1 week for reassessment.    Leeanne Rio, PA-C

## 2016-07-28 NOTE — Progress Notes (Signed)
Pre visit review using our clinic review tool, if applicable. No additional management support is needed unless otherwise documented below in the visit note. 

## 2016-07-28 NOTE — Patient Instructions (Addendum)
Please go to the lab for blood work. I will call you with your results.  Stay hydrated but limit salt intake!  Stop the fast food as it is very bad for you! Follow the diet below.   Please start a Claritin each morning and Benadryl at night over the next week. See if this helps with the hives. Keep a food/activity/symptoms journal to help [place] see if we can pick out any pattern to symptoms.   Continue chronicmedications as directed but start the Lasix once daily over the next week. Increase potassium intake.   Follow-up 1 week for reassessment. If there is any worsening of symptoms, please return sooner or go to the ER.   I believe we are dealing with two different issues here.

## 2016-08-03 ENCOUNTER — Encounter: Payer: Self-pay | Admitting: Family Medicine

## 2016-08-03 ENCOUNTER — Ambulatory Visit (INDEPENDENT_AMBULATORY_CARE_PROVIDER_SITE_OTHER): Payer: BC Managed Care – PPO | Admitting: Family Medicine

## 2016-08-03 VITALS — BP 122/82 | HR 79 | Temp 98.1°F | Resp 16 | Ht 63.0 in | Wt 267.4 lb

## 2016-08-03 DIAGNOSIS — L509 Urticaria, unspecified: Secondary | ICD-10-CM | POA: Diagnosis not present

## 2016-08-03 DIAGNOSIS — R609 Edema, unspecified: Secondary | ICD-10-CM

## 2016-08-03 DIAGNOSIS — R600 Localized edema: Secondary | ICD-10-CM | POA: Diagnosis not present

## 2016-08-03 LAB — BASIC METABOLIC PANEL
BUN: 16 mg/dL (ref 6–23)
CHLORIDE: 101 meq/L (ref 96–112)
CO2: 31 meq/L (ref 19–32)
Calcium: 9.7 mg/dL (ref 8.4–10.5)
Creatinine, Ser: 0.82 mg/dL (ref 0.40–1.20)
GFR: 76.14 mL/min (ref >60.00)
GLUCOSE: 79 mg/dL (ref 70–99)
Potassium: 3.3 mEq/L — ABNORMAL LOW (ref 3.5–5.1)
SODIUM: 139 meq/L (ref 135–145)

## 2016-08-03 MED ORDER — FUROSEMIDE 20 MG PO TABS: 20.0000 mg | ORAL_TABLET | Freq: Every day | ORAL | 3 refills | Status: DC

## 2016-08-03 NOTE — Assessment & Plan Note (Signed)
Resolved.  Pt has not had any hives since starting Claritin.  Continue daily antihistamine.

## 2016-08-03 NOTE — Assessment & Plan Note (Signed)
Improved since starting Lasix last week.  No longer having bullae or weeping.  Check BMP due to start of Lasix and refill provided.  Reviewed supportive care and red flags that should prompt return.  Pt expressed understanding and is in agreement w/ plan.

## 2016-08-03 NOTE — Patient Instructions (Signed)
Follow up as needed/scheduled We'll notify you of your lab results and make any changes if needed Continue your Lasix (water pill) once daily as long as legs are swollen Continue to elevate your legs, drink plenty of fluids, and limit your salt/sodium intake Continue the Claritin once daily to prevent hives Call with any questions or concerns Enjoy the rest of your summer!!!

## 2016-08-03 NOTE — Progress Notes (Signed)
Pre visit review using our clinic review tool, if applicable. No additional management support is needed unless otherwise documented below in the visit note. 

## 2016-08-03 NOTE — Progress Notes (Signed)
   Subjective:    Patient ID: Ariana [name], female    DOB: 11-27-1960, 56 y.o.   MRN: [id number]  HPI Edema- pt was started on Lasix 20mg  at last visit.  Pt is down 4 lbs sine 7/24.  Pt reports legs are '100% better' than last week.    Urticaria- hives 'are better'.  Pt reports she has not had any recently.  Continues to take claritin.     Review of Systems For ROS see HPI     Objective:   Physical Exam  Constitutional: She is oriented to person, place, and time. She appears well-developed and well-nourished. No distress.  obese  HENT:  Head: Normocephalic and atraumatic.  Eyes: Pupils are equal, round, and reactive to light. Conjunctivae and EOM are normal.  Neck: Normal range of motion. Neck supple. No thyromegaly present.  Cardiovascular: Normal rate, regular rhythm, normal heart sounds and intact distal pulses.   No murmur heard. Pulmonary/Chest: Effort normal and breath sounds normal. No respiratory distress.  Abdominal: Soft. She exhibits no distension. There is no tenderness.  Musculoskeletal: She exhibits edema (trace to 1+ edema bilaterally). She exhibits no tenderness.  Lymphadenopathy:    She has no cervical adenopathy.  Neurological: She is alert and oriented to person, place, and time.  Skin: Skin is warm and dry. No rash (no urticaria present today) noted.  Scattered excoriations on R leg  Psychiatric: She has a normal mood and affect. Her behavior is normal.  Vitals reviewed.         Assessment & Plan:

## 2016-08-04 ENCOUNTER — Other Ambulatory Visit: Payer: Self-pay | Admitting: General Practice

## 2016-08-04 MED ORDER — POTASSIUM CHLORIDE CRYS ER 20 MEQ PO TBCR: 20.0000 meq | EXTENDED_RELEASE_TABLET | Freq: Every day | ORAL | 3 refills | Status: DC

## 2016-10-05 ENCOUNTER — Ambulatory Visit (INDEPENDENT_AMBULATORY_CARE_PROVIDER_SITE_OTHER): Payer: BC Managed Care – PPO | Admitting: Family Medicine

## 2016-10-05 ENCOUNTER — Encounter: Payer: Self-pay | Admitting: General Practice

## 2016-10-05 ENCOUNTER — Encounter: Payer: Self-pay | Admitting: Family Medicine

## 2016-10-05 VITALS — BP 124/82 | HR 80 | Temp 99.2°F | Resp 16 | Ht 63.0 in | Wt 272.5 lb

## 2016-10-05 DIAGNOSIS — M7918 Myalgia, other site: Secondary | ICD-10-CM

## 2016-10-05 MED ORDER — PREDNISONE 10 MG PO TABS: ORAL_TABLET | ORAL | 0 refills | Status: DC

## 2016-10-05 MED ORDER — CYCLOBENZAPRINE HCL 10 MG PO TABS: 10.0000 mg | ORAL_TABLET | Freq: Three times a day (TID) | ORAL | 0 refills | Status: DC | PRN

## 2016-10-05 NOTE — Progress Notes (Signed)
Pre visit review using our clinic review tool, if applicable. No additional management support is needed unless otherwise documented below in the visit note. 

## 2016-10-05 NOTE — Progress Notes (Signed)
   Subjective:    Patient ID: Ariana [name], female    DOB: 1960/04/15, 56 y.o.   MRN: [id number]  HPI Back/hip pain- L sided.  sxs started in July when pt became sole caregiver for elderly mother and was doing all of her transfers.  Pt reports she had pain all through August and September.  Pain worsens w/ standing.  Pain is located over PSIS and glute and will radiate around laterally to front.  Pt has tried Advil, Tylenol, Aleve w/o relief.  Mild, temporary relief w/ ice or heat.     Review of Systems For ROS see HPI     Objective:   Physical Exam  Constitutional: She is oriented to person, place, and time. She appears well-developed and well-nourished. No distress.  Morbidly obese  Musculoskeletal: She exhibits tenderness (TTP over L glute w/ obvious spasm).  Mild SLR on L, (-) on R  Neurological: She is alert and oriented to person, place, and time. No cranial nerve deficit. Coordination normal.  Skin: Skin is warm and dry.  Psychiatric: She has a normal mood and affect. Her behavior is normal. Thought content normal.  Vitals reviewed.         Assessment & Plan:  Gluteal pain- new.  Pt has not had relief w/ OTC NSAIDs or tylenol.  Pt has obvious gluteal spasm and likely inflammation from heavy lifting most of this summer.  Start prednisone taper.  Flexeril for spasm.  Refer to PT for evaluation/treatment.  Reviewed supportive care and red flags that should prompt return.  Pt expressed understanding and is in agreement w/ plan.

## 2016-10-05 NOTE — Patient Instructions (Signed)
Follow up as needed or as scheduled Start the Prednisone as directed- 3 pills at the same time x3 days, then 2 pills at the same time x3 days, and then 1 pill x3 days.  Take w/ food Use the Flexeril nightly for spasm Continue to heat! We'll call you with your physical therapy appointment Call with any questions or concerns Hang in there!!!

## 2016-10-11 ENCOUNTER — Other Ambulatory Visit: Payer: Self-pay | Admitting: Family Medicine

## 2016-10-12 NOTE — Telephone Encounter (Signed)
Last OV 10/05/16 (gluteal pain) Alprazolam last filled 03/10/16 #60 with 1

## 2016-10-12 NOTE — Telephone Encounter (Signed)
Medication filled to pharmacy as requested.   

## 2016-10-13 ENCOUNTER — Ambulatory Visit (INDEPENDENT_AMBULATORY_CARE_PROVIDER_SITE_OTHER): Payer: BC Managed Care – PPO | Admitting: Physical Therapy

## 2016-10-13 DIAGNOSIS — R2689 Other abnormalities of gait and mobility: Secondary | ICD-10-CM | POA: Diagnosis not present

## 2016-10-13 DIAGNOSIS — M25552 Pain in left hip: Secondary | ICD-10-CM | POA: Diagnosis not present

## 2016-10-13 NOTE — Therapy (Signed)
[facility] [address] [place], [place], [address] Phone: [phone]   Fax:  [phone]  Physical Therapy Evaluation  Patient Details  Name: Ariana [name] MRN: [id number] Date of Birth: 03/05/1960 Referring Provider: Dr. Dimple Nanas  Encounter Date: 10/13/2016      PT End of Session - 10/13/16 1515    Visit Number 1   Number of Visits 12   Date for PT Re-Evaluation 11/24/16   Authorization Type BCBS   PT Start Time 1430   PT Stop Time 1503   PT Time Calculation (min) 33 min   Activity Tolerance Patient tolerated treatment well   Behavior During Therapy [facility] for tasks assessed/performed      Past Medical History:  Diagnosis Date  . Anxiety   . Depression   . Hyperlipidemia   . Hypertension   . OA (osteoarthritis)    in Bil knees/ gets steroid injections for pain  . Ovarian cyst    right ovary/had ablation    Past Surgical History:  Procedure Laterality Date  . ENDOMETRIAL ABLATION  2012  . OVARIAN CYST REMOVAL Right 2012    There were no vitals filed for this visit.       Subjective Assessment - 10/13/16 1433    Subjective Pt is a 56 y/o female who presents to [facility] for increased pain in Lt low back and glute.  Pt attributes pain to having to provide total care to mother including lifting and transferring pt.  Pt reports now that Hospice is involved, she is only helping to roll pt side to side.     Pertinent History OA in bil knees (followed by Dr. Alvan Dame)   Limitations Standing;Walking;Lifting   How long can you stand comfortably? 10 min   How long can you walk comfortably? 10-15 min (but still uncomfortable with all amb)   Patient Stated Goals improve pain   Currently in Pain? Yes   Pain Score 3   up to 10/10   Pain Location Buttocks   Pain Orientation Left   Pain Descriptors / Indicators Dull   Pain Type Acute pain   Pain Onset More than a month ago   Pain Frequency Constant   Aggravating Factors   standing, walking, transitional movements   Pain Relieving Factors nothing            [facility] PT Assessment - 10/13/16 1438      Assessment   Medical Diagnosis Gluteal Pain; Lt side   Referring Provider Dr. Dimple Nanas   Onset Date/Surgical Date --  Aug 2018   Next MD Visit PRN   Prior Therapy none     Precautions   Precautions None     Restrictions   Weight Bearing Restrictions No     Balance Screen   Has the patient fallen in the past 6 months No   Has the patient had a decrease in activity level because of a fear of falling?  No   Is the patient reluctant to leave their home because of a fear of falling?  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, otherwise seated mostly   Leisure nothing     Cognition   Overall Cognitive Status Within Functional Limits for tasks assessed     ROM / Strength   AROM / PROM / Strength Strength;AROM     AROM   Overall AROM Comments lumbar ROM WNL (see pain below)   AROM Assessment Site Lumbar   Lumbar Flexion end range pain   Lumbar Extension end range pain   Lumbar - Right Side Bend "pulling" sensation   Lumbar - Left Side Bend increased pain   Lumbar - Right Rotation "pulling" sensation   Lumbar - Left Rotation increased pain     Strength   Strength Assessment Site Hip;Knee;Ankle   Right/Left Hip Right;Left   Right Hip Flexion 4/5   Right Hip ABduction 5/5   Left Hip Flexion 3/5  with pain   Left Hip Extension 4/5   Left Hip ABduction 4/5   Right/Left Knee Right;Left   Right Knee Extension 5/5   Left Knee Extension 5/5   Right/Left Ankle Right;Left    Right Ankle Dorsiflexion 5/5   Left Ankle Dorsiflexion 5/5     Flexibility   Soft Tissue Assessment /Muscle Length yes   Hamstrings tightness on Lt   Piriformis tightness on Lt   Quadratus Lumborum tightness on Lt     Palpation   Palpation comment significant tenderness over Lt glut med/max and near piriformis     Ambulation/Gait   Gait Pattern Step-to pattern;Antalgic            Objective measurements completed on examination: See above findings.          [facility] Adult PT Treatment/Exercise - 10/13/16 1438      Self-Care   Self-Care Other Self-Care Comments   Other Self-Care Comments  instructed in self STM and myofascial release with ball and instructed on where to purchase     Exercises   Exercises Lumbar     Lumbar Exercises: Stretches   Single Knee to Chest Stretch 2 reps;30 seconds   Lower Trunk Rotation 2 reps;30 seconds   Piriformis Stretch 2 reps;30 seconds                PT Education - 10/13/16 1514    Education provided Yes   Education Details DN, HEP, use of ball for self STM   Person(s) Educated Patient   Methods Explanation;Demonstration;Handout   Comprehension Verbalized understanding;Returned demonstration;Need further instruction             PT Long Term Goals - 10/13/16 1522      PT LONG TERM GOAL #1   Title independent with HEP   Status New   Target Date 11/24/16     PT LONG TERM GOAL #2   Title perform lumbar ROM without increase in pain for improved function and mobility   Status New   Target Date 11/24/16     PT LONG TERM GOAL #3   Title report ability to stand and walk > 25 min without increase in pain for improved function and mobility   Status New   Target Date 11/24/16                Plan - 10/13/16 1517    Clinical Impression Statement Pt is a 57 y/o female who presents to [facility] for Lt sided buttock pain x 2 months, likely due to providing total care to mother.  Symptoms consistent with muscle  strain and pt demonstrates tenderness and active  trigger points in glut max and med.  Will benefit from PT to address deficits and maximize function.   History and Personal Factors relevant to plan of care: obesity, OA   Clinical Presentation Stable   Clinical Decision Making Low   Rehab Potential Good   PT Frequency 2x / week   PT Duration 6 weeks   PT Treatment/Interventions ADLs/Self Care Home Management;Cryotherapy;Electrical Stimulation;Moist Heat;Ultrasound;Therapeutic exercise;Therapeutic activities;Functional mobility training;Gait training;Patient/family education;Dry needling;Taping;Manual techniques   PT Next Visit Plan review HEP, manual and DN to Lt glut, modalities PRN   Consulted and Agree with Plan of Care Patient      Patient will benefit from skilled therapeutic intervention in order to improve the following deficits and impairments:  Abnormal gait, Increased fascial restricitons, Increased muscle spasms, Difficulty walking, Decreased mobility, Decreased strength, Impaired flexibility, Pain  Visit Diagnosis: Pain in left hip - Plan: PT plan of care cert/re-cert  Other abnormalities of gait and mobility - Plan: PT plan of care cert/re-cert     Problem List Patient Active Problem List   Diagnosis Date Noted  . Peripheral edema 07/28/2016  . Urticaria 07/28/2016  . Physical exam 03/05/2016  . Allergic rhinitis 10/31/2015  . Severe obesity (BMI >= 40) ([place]) 08/29/2015  . Anxiety and depression 08/29/2015  . HTN (hypertension) 08/29/2015  . Hyperlipidemia 08/29/2015     Laureen Abrahams, PT, DPT 10/13/16 3:26 PM    [facility] [place], [place], [address] Phone: [phone]   Fax:  [phone]  Name: DUSTY WAGONER MRN: [id number] Date of Birth: 09-04-1960

## 2016-10-13 NOTE — Patient Instructions (Signed)
Trigger Point Dry Needling  . What is Trigger Point Dry Needling (DN)? o DN is a physical therapy technique used to treat muscle pain and dysfunction. Specifically, DN helps deactivate muscle trigger points (muscle knots).  o A thin filiform needle is used to penetrate the skin and stimulate the underlying trigger point. The goal is for a local twitch response (LTR) to occur and for the trigger point to relax. No medication of any kind is injected during the procedure.   . What Does Trigger Point Dry Needling Feel Like?  o The procedure feels different for each individual patient. Some patients report that they do not actually feel the needle enter the skin and overall the process is not painful. Very mild bleeding may occur. However, many patients feel a deep cramping in the muscle in which the needle was inserted. This is the local twitch response.   [place] Kitchen How Will I feel after the treatment? o Soreness is normal, and the onset of soreness may not occur for a few hours. Typically this soreness does not last longer than two days.  o Bruising is uncommon, however; ice can be used to decrease any possible bruising.  o In rare cases feeling tired or nauseous after the treatment is normal. In addition, your symptoms may get worse before they get better, this period will typically not last longer than 24 hours.   . What Can I do After My Treatment? o Increase your hydration by drinking more water for the next 24 hours. o You may place ice or heat on the areas treated that have become sore, however, do not use heat on inflamed or bruised areas. Heat often brings more relief post needling. o You can continue your regular activities, but vigorous activity is not recommended initially after the treatment for 24 hours. o DN is best combined with other physical therapy such as strengthening, stretching, and other therapies.      Piriformis (Supine)   Cross legs, left on top. Gently pull left knee towards  right shoulder until stretch is felt in buttock/hip of top leg. Use a towel if needed.  Hold _30___ seconds. Repeat ___3_ times per set.  Do __2__ sessions per day.    Lower Trunk Rotation Stretch   Keeping back flat and feet together, rotate knees to left side. Hold ___30_ seconds. Repeat __3_ times per set. . Do __2__ sessions per day.    Knee to Chest (Flexion)    Pull knee toward chest. Feel stretch in lower back or buttock area. Breathing deeply, Hold _20-30___ seconds. Repeat with other knee. Repeat __2-3__ times. Do _2-3___ sessions per day.    TARGET BALL:  Turn towards the main section of the store and head towards the cards.  Turn at the cards and head towards the back of the store.  Just past the home goods before you get to the toys you will see an end cap with "party favor" type toys.  The ball is located in one of these bins.  Look for the softball sized ball that lights up.  It should be around $5.

## 2016-10-19 ENCOUNTER — Ambulatory Visit (INDEPENDENT_AMBULATORY_CARE_PROVIDER_SITE_OTHER): Payer: BC Managed Care – PPO | Admitting: Physical Therapy

## 2016-10-19 DIAGNOSIS — M25552 Pain in left hip: Secondary | ICD-10-CM | POA: Diagnosis not present

## 2016-10-19 DIAGNOSIS — R2689 Other abnormalities of gait and mobility: Secondary | ICD-10-CM | POA: Diagnosis not present

## 2016-10-19 NOTE — Therapy (Signed)
[facility] [address] [place], [place], [address] Phone: [phone]   Fax:  [phone]  Physical Therapy Treatment  Patient Details  Name: Ariana [name] MRN: [id number] Date of Birth: 09/13/1960 Referring Provider: Dr. Dimple Nanas  Encounter Date: 10/19/2016      PT End of Session - 10/19/16 1047    Visit Number 2   Number of Visits 12   Date for PT Re-Evaluation 11/24/16   Authorization Type BCBS   PT Start Time 1016   PT Stop Time 1100   PT Time Calculation (min) 44 min   Activity Tolerance Patient tolerated treatment well   Behavior During Therapy [facility] for tasks assessed/performed      Past Medical History:  Diagnosis Date  . Anxiety   . Depression   . Hyperlipidemia   . Hypertension   . OA (osteoarthritis)    in Bil knees/ gets steroid injections for pain  . Ovarian cyst    right ovary/had ablation    Past Surgical History:  Procedure Laterality Date  . ENDOMETRIAL ABLATION  2012  . OVARIAN CYST REMOVAL Right 2012    There were no vitals filed for this visit.      Subjective Assessment - 10/19/16 1036    Subjective feeling better, still having some soreness but much better.   Patient Stated Goals improve pain   Currently in Pain? Yes   Pain Score 3    Pain Location Buttocks   Pain Orientation Left   Pain Descriptors / Indicators Dull   Pain Type Acute pain   Pain Onset More than a month ago   Pain Frequency Constant   Aggravating Factors  standing, walking, transitional movements   Pain Relieving Factors stretches and ball                         [facility] Adult PT Treatment/Exercise - 10/19/16 1037      Lumbar Exercises: Stretches   Single Knee to Chest Stretch 2 reps;30 seconds   Single Knee to Chest Stretch Limitations Lt side   Lower Trunk Rotation 2 reps;30 seconds   Lower Trunk Rotation Limitations to Rt for Lt side stretch   Piriformis Stretch 2 reps;30 seconds   Piriformis Stretch Limitations Lt side     Modalities   Modalities Moist Heat;Electrical Stimulation     Moist Heat Therapy   Number Minutes Moist Heat 15 Minutes   Moist Heat Location Hip  Lt     Electrical Stimulation   Electrical Stimulation Location Lt hip   Electrical Stimulation Action IFC   Electrical Stimulation Parameters to tolerance in sidelying x 15 min   Electrical Stimulation Goals Pain     Manual Therapy   Manual Therapy Soft tissue mobilization   Manual therapy comments pt sidelying   Soft tissue mobilization Lt glutes          Trigger Point Dry Needling - 10/19/16 1046    Consent Given? Yes   Education Handout Provided Yes   Muscles Treated Lower Body Gluteus maximus   Gluteus Maximus Response Twitch response elicited;Palpable increased muscle length              PT Education - 10/19/16 1047    Education provided Yes   Education Details verbally reviewed DN   Person(s) Educated Patient   Methods Explanation   Comprehension Verbalized understanding             PT Long Term  Goals - 10/13/16 1522      PT LONG TERM GOAL #1   Title independent with HEP   Status New   Target Date 11/24/16     PT LONG TERM GOAL #2   Title perform lumbar ROM without increase in pain for improved function and mobility   Status New   Target Date 11/24/16     PT LONG TERM GOAL #3   Title report ability to stand and walk > 25 min without increase in pain for improved function and mobility   Status New   Target Date 11/24/16               Plan - 10/19/16 1047    Clinical Impression Statement Pt reports improvement in symptoms but continues to have some pain expecially with prolonged walking and activity.  DN performed today with manual therapy to help decrease active trigger points and pt reports decreased soreness following.  Pt with ice burn due to contact with ice pack directly on skin on Lt buttock, so avoided this area of needling and manual  today.  Advised to use barrier between self and ice pack going forward.  Pt verbalized understanding.   PT Treatment/Interventions ADLs/Self Care Home Management;Cryotherapy;Electrical Stimulation;Moist Heat;Ultrasound;Therapeutic exercise;Therapeutic activities;Functional mobility training;Gait training;Patient/family education;Dry needling;Taping;Manual techniques   PT Next Visit Plan continue manual and stretches, assess response to needling, modalities PRN   Consulted and Agree with Plan of Care Patient      Patient will benefit from skilled therapeutic intervention in order to improve the following deficits and impairments:  Abnormal gait, Increased fascial restricitons, Increased muscle spasms, Difficulty walking, Decreased mobility, Decreased strength, Impaired flexibility, Pain  Visit Diagnosis: Pain in left hip  Other abnormalities of gait and mobility     Problem List Patient Active Problem List   Diagnosis Date Noted  . Peripheral edema 07/28/2016  . Urticaria 07/28/2016  . Physical exam 03/05/2016  . Allergic rhinitis 10/31/2015  . Severe obesity (BMI >= 40) ([place]) 08/29/2015  . Anxiety and depression 08/29/2015  . HTN (hypertension) 08/29/2015  . Hyperlipidemia 08/29/2015      Laureen Abrahams, PT, DPT 10/19/16 10:53 AM    [place], [place], [address] Phone: [phone]   Fax:  [phone]  Name: Ariana [name] MRN: [id number] Date of Birth: 10/14/1960

## 2016-10-22 ENCOUNTER — Ambulatory Visit (INDEPENDENT_AMBULATORY_CARE_PROVIDER_SITE_OTHER): Payer: BC Managed Care – PPO | Admitting: Physical Therapy

## 2016-10-22 DIAGNOSIS — M25552 Pain in left hip: Secondary | ICD-10-CM

## 2016-10-22 DIAGNOSIS — R2689 Other abnormalities of gait and mobility: Secondary | ICD-10-CM | POA: Diagnosis not present

## 2016-10-22 NOTE — Therapy (Signed)
[facility] [address] [place], [place], [address] Phone: [phone]   Fax:  [phone]  Physical Therapy Treatment  Patient Details  Name: Ariana [name] MRN: [id number] Date of Birth: [birth year] Referring Provider: Dr. Dimple Nanas  Encounter Date: 10/22/2016      PT End of Session - 10/22/16 1000    Visit Number 3   Number of Visits 12   Date for PT Re-Evaluation 11/24/16   Authorization Type BCBS   PT Start Time 0930   PT Stop Time 1013   PT Time Calculation (min) 43 min   Activity Tolerance Patient tolerated treatment well   Behavior During Therapy [facility] for tasks assessed/performed      Past Medical History:  Diagnosis Date  . Anxiety   . Depression   . Hyperlipidemia   . Hypertension   . OA (osteoarthritis)    in Bil knees/ gets steroid injections for pain  . Ovarian cyst    right ovary/had ablation    Past Surgical History:  Procedure Laterality Date  . ENDOMETRIAL ABLATION  2012  . OVARIAN CYST REMOVAL Right 2012    There were no vitals filed for this visit.      Subjective Assessment - 10/22/16 0930    Subjective feels 50% better; has moments where she doesn't feel any pain and then has times where she can barely walk. stretching seems to be helping.  thinks DN did help some.   Patient Stated Goals improve pain   Currently in Pain? Yes   Pain Score 6    Pain Orientation Left   Pain Descriptors / Indicators Squeezing  and twisting   Pain Type Acute pain   Pain Onset More than a month ago   Pain Frequency Constant   Aggravating Factors  standing, walking, transitional movements   Pain Relieving Factors stretches, ball, DN                         [facility] Adult PT Treatment/Exercise - 10/22/16 0934      Lumbar Exercises: Stretches   Single Knee to Chest Stretch 2 reps;30 seconds   Single Knee to Chest Stretch Limitations Lt side   Lower Trunk Rotation 2 reps;30 seconds   Lower  Trunk Rotation Limitations to Rt for Lt side stretch   Quadruped Mid Back Stretch 2 reps;30 seconds  to Rt for Lt side stretch   Quadruped Mid Back Stretch Limitations seated with physioball   Piriformis Stretch 2 reps;30 seconds   Piriformis Stretch Limitations Lt side     Moist Heat Therapy   Number Minutes Moist Heat 15 Minutes   Moist Heat Location Hip  Lt     Electrical Stimulation   Electrical Stimulation Location Lt hip   Electrical Stimulation Action IFC   Electrical Stimulation Parameters to tolerance x 15 min   Electrical Stimulation Goals Pain     Manual Therapy   Manual Therapy Soft tissue mobilization   Manual therapy comments pt prone   Soft tissue mobilization Lt glutes and QL                     PT Long Term Goals - 10/13/16 1522      PT LONG TERM GOAL #1   Title independent with HEP   Status New   Target Date 11/24/16     PT LONG TERM GOAL #2   Title perform lumbar ROM without increase in pain  for improved function and mobility   Status New   Target Date 11/24/16     PT LONG TERM GOAL #3   Title report ability to stand and walk > 25 min without increase in pain for improved function and mobility   Status New   Target Date 11/24/16               Plan - 10/22/16 1000    Clinical Impression Statement Pt reports 50% improvement in symptoms after 2 sessions, and feels stretching and DN have been helpful as well as manual and modalities.  Will plan to continue DN next session PRN.     PT Treatment/Interventions ADLs/Self Care Home Management;Cryotherapy;Electrical Stimulation;Moist Heat;Ultrasound;Therapeutic exercise;Therapeutic activities;Functional mobility training;Gait training;Patient/family education;Dry needling;Taping;Manual techniques   PT Next Visit Plan continue manual and stretches, DN to piriformis, modalities PRN   Consulted and Agree with Plan of Care Patient      Patient will benefit from skilled therapeutic  intervention in order to improve the following deficits and impairments:  Abnormal gait, Increased fascial restricitons, Increased muscle spasms, Difficulty walking, Decreased mobility, Decreased strength, Impaired flexibility, Pain  Visit Diagnosis: Pain in left hip  Other abnormalities of gait and mobility     Problem List Patient Active Problem List   Diagnosis Date Noted  . Peripheral edema 07/28/2016  . Urticaria 07/28/2016  . Physical exam 03/05/2016  . Allergic rhinitis 10/31/2015  . Severe obesity (BMI >= 40) ([place]) 08/29/2015  . Anxiety and depression 08/29/2015  . HTN (hypertension) 08/29/2015  . Hyperlipidemia 08/29/2015      Laureen Abrahams, PT, DPT 10/22/16 10:02 AM    [facility] [place], [place], [address] Phone: [phone]   Fax:  [phone]  Name: Ariana [name] MRN: [id number] Date of Birth: [birth year]

## 2016-10-26 ENCOUNTER — Ambulatory Visit (INDEPENDENT_AMBULATORY_CARE_PROVIDER_SITE_OTHER): Payer: BC Managed Care – PPO | Admitting: Physical Therapy

## 2016-10-26 DIAGNOSIS — M25552 Pain in left hip: Secondary | ICD-10-CM | POA: Diagnosis not present

## 2016-10-26 DIAGNOSIS — R2689 Other abnormalities of gait and mobility: Secondary | ICD-10-CM | POA: Diagnosis not present

## 2016-10-26 NOTE — Therapy (Signed)
[facility] [address] [place], [place], [address] Phone: [phone]   Fax:  [phone]  Physical Therapy Treatment  Patient Details  Name: Ariana [name] MRN: [id number] Date of Birth: [birth year] Referring Provider: Dr. Dimple Nanas  Encounter Date: 10/26/2016      PT End of Session - 10/26/16 0835    Visit Number 4   Number of Visits 12   Date for PT Re-Evaluation 11/24/16   Authorization Type BCBS   PT Start Time 0800   PT Stop Time 0848   PT Time Calculation (min) 48 min   Activity Tolerance Patient tolerated treatment well   Behavior During Therapy [facility] for tasks assessed/performed      Past Medical History:  Diagnosis Date  . Anxiety   . Depression   . Hyperlipidemia   . Hypertension   . OA (osteoarthritis)    in Bil knees/ gets steroid injections for pain  . Ovarian cyst    right ovary/had ablation    Past Surgical History:  Procedure Laterality Date  . ENDOMETRIAL ABLATION  2012  . OVARIAN CYST REMOVAL Right 2012    There were no vitals filed for this visit.      Subjective Assessment - 10/26/16 0801    Subjective having a harder time this week compared to last week.  went shopping and had a hard time finding a place to sit down.     Patient Stated Goals improve pain   Currently in Pain? Yes   Pain Score 7    Pain Location Buttocks   Pain Orientation Left   Pain Descriptors / Indicators Squeezing;Spasm   Pain Type Acute pain   Pain Onset More than a month ago   Pain Frequency Constant   Aggravating Factors  standing, walking, transitional movements   Pain Relieving Factors stretches, ball, DN                         [facility] Adult PT Treatment/Exercise - 10/26/16 0834      Moist Heat Therapy   Number Minutes Moist Heat 15 Minutes   Moist Heat Location Hip  Lt     Electrical Stimulation   Electrical Stimulation Location Lt hip   Electrical Stimulation Action IFC   Electrical Stimulation Parameters to tolerance x 15 min   Electrical Stimulation Goals Pain     Manual Therapy   Manual Therapy Soft tissue mobilization   Soft tissue mobilization Lt glutes          Trigger Point Dry Needling - 10/26/16 0834    Consent Given? Yes   Muscles Treated Lower Body Gluteus maximus;Piriformis   Gluteus Maximus Response Twitch response elicited;Palpable increased muscle length   Piriformis Response Twitch response elicited;Palpable increased muscle length                   PT Long Term Goals - 10/13/16 1522      PT LONG TERM GOAL #1   Title independent with HEP   Status New   Target Date 11/24/16     PT LONG TERM GOAL #2   Title perform lumbar ROM without increase in pain for improved function and mobility   Status New   Target Date 11/24/16     PT LONG TERM GOAL #3   Title report ability to stand and walk > 25 min without increase in pain for improved function and mobility   Status New   Target  Date 11/24/16               Plan - 10/26/16 0835    Clinical Impression Statement Pt reports slightly elevated pain this week compared to last week, but also reports increased activity at home.  DN performed again today to glute max and piriformis with decreased pain with transitional movements following.  Slowly progressing towards goals.   PT Treatment/Interventions ADLs/Self Care Home Management;Cryotherapy;Electrical Stimulation;Moist Heat;Ultrasound;Therapeutic exercise;Therapeutic activities;Functional mobility training;Gait training;Patient/family education;Dry needling;Taping;Manual techniques   PT Next Visit Plan continue manual and stretches, assess response to DN to piriformis, modalities PRN   Consulted and Agree with Plan of Care Patient      Patient will benefit from skilled therapeutic intervention in order to improve the following deficits and impairments:  Abnormal gait, Increased fascial restricitons, Increased muscle  spasms, Difficulty walking, Decreased mobility, Decreased strength, Impaired flexibility, Pain  Visit Diagnosis: Pain in left hip  Other abnormalities of gait and mobility     Problem List Patient Active Problem List   Diagnosis Date Noted  . Peripheral edema 07/28/2016  . Urticaria 07/28/2016  . Physical exam 03/05/2016  . Allergic rhinitis 10/31/2015  . Severe obesity (BMI >= 40) ([place]) 08/29/2015  . Anxiety and depression 08/29/2015  . HTN (hypertension) 08/29/2015  . Hyperlipidemia 08/29/2015      Laureen Abrahams, PT, DPT 10/26/16 8:37 AM     [facility] [place], [place], [address] Phone: [phone]   Fax:  [phone]  Name: Ariana [name] MRN: [id number] Date of Birth: 11-Jan-1960

## 2016-10-29 ENCOUNTER — Ambulatory Visit (INDEPENDENT_AMBULATORY_CARE_PROVIDER_SITE_OTHER): Payer: BC Managed Care – PPO | Admitting: Physical Therapy

## 2016-10-29 DIAGNOSIS — R2689 Other abnormalities of gait and mobility: Secondary | ICD-10-CM

## 2016-10-29 DIAGNOSIS — M25552 Pain in left hip: Secondary | ICD-10-CM

## 2016-10-29 NOTE — Therapy (Signed)
[facility] [address] [place], [place], [address] Phone: [phone]   Fax:  [phone]  Physical Therapy Treatment  Patient Details  Name: Ariana [name] MRN: [id number] Date of Birth: October 17, 1960 Referring Provider: Dr. Dimple Nanas  Encounter Date: 10/29/2016      PT End of Session - 10/29/16 1002    Visit Number 5   Number of Visits 12   Date for PT Re-Evaluation 11/24/16   Authorization Type BCBS   PT Start Time 0930   PT Stop Time 1012   PT Time Calculation (min) 42 min   Activity Tolerance Patient tolerated treatment well   Behavior During Therapy [facility] for tasks assessed/performed      Past Medical History:  Diagnosis Date  . Anxiety   . Depression   . Hyperlipidemia   . Hypertension   . OA (osteoarthritis)    in Bil knees/ gets steroid injections for pain  . Ovarian cyst    right ovary/had ablation    Past Surgical History:  Procedure Laterality Date  . ENDOMETRIAL ABLATION  2012  . OVARIAN CYST REMOVAL Right 2012    There were no vitals filed for this visit.      Subjective Assessment - 10/29/16 0932    Subjective felt prifiromis DN was realy helpful; still having pain with rolling to Rt on Lt hip.  pain is better but still present. walking feels "70%" better   How long can you walk comfortably? yard: 20 min; cement: 10-15 min   Patient Stated Goals improve pain   Pain Score 0-No pain                         [facility] Adult PT Treatment/Exercise - 10/29/16 0934      Lumbar Exercises: Stretches   Lower Trunk Rotation 3 reps;30 seconds   Lower Trunk Rotation Limitations to Rt for Lt side stretch     Lumbar Exercises: Supine   Bridge Compliant;10 reps;5 seconds   Bridge Limitations orange physioball   Large Ball Abdominal Isometric 10 reps;5 seconds   Large Ball Oblique Isometric 10 reps;5 seconds   Large Ball Oblique Isometric Limitations bil     Modalities   Modalities Moist  Heat;Electrical Stimulation     Moist Heat Therapy   Number Minutes Moist Heat 15 Minutes   Moist Heat Location Hip  Lt     Electrical Stimulation   Electrical Stimulation Location Lt hip   Electrical Stimulation Action IFC   Electrical Stimulation Parameters to tolerance x 15 min   Electrical Stimulation Goals Pain     Manual Therapy   Manual Therapy Joint mobilization;Passive ROM   Manual therapy comments pt supine   Joint Mobilization Lt hip long axis distraction with belt; and A/P grade 2-3 mobs   Passive ROM Lt hip all motions with HS and piriformis stretching                     PT Long Term Goals - 10/13/16 1522      PT LONG TERM GOAL #1   Title independent with HEP   Status New   Target Date 11/24/16     PT LONG TERM GOAL #2   Title perform lumbar ROM without increase in pain for improved function and mobility   Status New   Target Date 11/24/16     PT LONG TERM GOAL #3   Title report ability to stand and walk >  25 min without increase in pain for improved function and mobility   Status New   Target Date 11/24/16               Plan - 10/29/16 1003    Clinical Impression Statement Pt tolerated session well today and reports 70% improvement in symptoms overall.  Still having pain with rotational movements in supine but pt reports this is improving.  Steadily progressing towards goals.   PT Treatment/Interventions ADLs/Self Care Home Management;Cryotherapy;Electrical Stimulation;Moist Heat;Ultrasound;Therapeutic exercise;Therapeutic activities;Functional mobility training;Gait training;Patient/family education;Dry needling;Taping;Manual techniques   PT Next Visit Plan continue manual and stretches, assess response to DN to piriformis, modalities PRN   Consulted and Agree with Plan of Care Patient      Patient will benefit from skilled therapeutic intervention in order to improve the following deficits and impairments:  Abnormal gait, Increased  fascial restricitons, Increased muscle spasms, Difficulty walking, Decreased mobility, Decreased strength, Impaired flexibility, Pain  Visit Diagnosis: Pain in left hip  Other abnormalities of gait and mobility     Problem List Patient Active Problem List   Diagnosis Date Noted  . Peripheral edema 07/28/2016  . Urticaria 07/28/2016  . Physical exam 03/05/2016  . Allergic rhinitis 10/31/2015  . Severe obesity (BMI >= 40) ([place]) 08/29/2015  . Anxiety and depression 08/29/2015  . HTN (hypertension) 08/29/2015  . Hyperlipidemia 08/29/2015      Laureen Abrahams, PT, DPT 10/29/16 10:05 AM    [facility] [place], [place], [address] Phone: [phone]   Fax:  [phone]  Name: Ariana [name] MRN: [id number] Date of Birth: [birth year]

## 2016-11-02 ENCOUNTER — Ambulatory Visit (INDEPENDENT_AMBULATORY_CARE_PROVIDER_SITE_OTHER): Payer: BC Managed Care – PPO | Admitting: Physical Therapy

## 2016-11-02 DIAGNOSIS — M25552 Pain in left hip: Secondary | ICD-10-CM | POA: Diagnosis not present

## 2016-11-02 DIAGNOSIS — R2689 Other abnormalities of gait and mobility: Secondary | ICD-10-CM

## 2016-11-02 NOTE — Therapy (Addendum)
[facility] [address] [place], [place], [address] Phone: [phone]   Fax:  [phone]  Physical Therapy Treatment/Discharge  Patient Details  Name: Ariana [name] MRN: [id number] Date of Birth: December 13, 1960 Referring Provider: Dr. Dimple Nanas  Encounter Date: 11/02/2016      PT End of Session - 11/02/16 1002    Visit Number 6   Number of Visits 12   Date for PT Re-Evaluation 11/24/16   Authorization Type BCBS   PT Start Time [phone]   PT Stop Time 1010   PT Time Calculation (min) 36 min   Activity Tolerance Patient tolerated treatment well   Behavior During Therapy [facility] for tasks assessed/performed      Past Medical History:  Diagnosis Date  . Anxiety   . Depression   . Hyperlipidemia   . Hypertension   . OA (osteoarthritis)    in Bil knees/ gets steroid injections for pain  . Ovarian cyst    right ovary/had ablation    Past Surgical History:  Procedure Laterality Date  . ENDOMETRIAL ABLATION  2012  . OVARIAN CYST REMOVAL Right 2012    There were no vitals filed for this visit.      Subjective Assessment - 11/02/16 0934    Subjective feels 75-80% better today.  still waking some with rolling but it is better than before   Patient Stated Goals improve pain   Currently in Pain? Yes   Pain Score 2    Pain Location Buttocks   Pain Orientation Left   Pain Descriptors / Indicators Spasm;Squeezing   Pain Type Acute pain   Pain Onset More than a month ago   Pain Frequency Constant   Aggravating Factors  standing, walking, transitional movements   Pain Relieving Factors stretches, ball, DN                         [facility] Adult PT Treatment/Exercise - 11/02/16 0956      Modalities   Modalities Moist Heat;Electrical Stimulation     Moist Heat Therapy   Number Minutes Moist Heat 15 Minutes   Moist Heat Location Hip  Lt     Electrical Stimulation   Electrical Stimulation Location Lt hip   Electrical Stimulation Action IFC   Electrical Stimulation Parameters to tolerance x 15 min   Electrical Stimulation Goals Pain     Manual Therapy   Manual Therapy Soft tissue mobilization   Soft tissue mobilization Lt glutes          Trigger Point Dry Needling - 11/02/16 1001    Consent Given? Yes   Muscles Treated Lower Body Gluteus maximus;Gluteus minimus   Gluteus Maximus Response Twitch response elicited;Palpable increased muscle length   Gluteus Minimus Response Twitch response elicited;Palpable increased muscle length                   PT Long Term Goals - 10/13/16 1522      PT LONG TERM GOAL #1   Title independent with HEP   Status New   Target Date 11/24/16     PT LONG TERM GOAL #2   Title perform lumbar ROM without increase in pain for improved function and mobility   Status New   Target Date 11/24/16     PT LONG TERM GOAL #3   Title report ability to stand and walk > 25 min without increase in pain for improved function and mobility   Status New  Target Date 11/24/16               Plan - 11/02/16 1002    Clinical Impression Statement Pt tolerated session well today and requested DN and manual as well as heat/estim following.  She reports compliance with HEP and 75-80% improvement in symptoms.  Considering how well pt is doing, plan to decr freq to 1x/wk and may only see 1-2 more sessions.  Progressing well towards goals.   PT Treatment/Interventions ADLs/Self Care Home Management;Cryotherapy;Electrical Stimulation;Moist Heat;Ultrasound;Therapeutic exercise;Therapeutic activities;Functional mobility training;Gait training;Patient/family education;Dry needling;Taping;Manual techniques   PT Next Visit Plan continue manual and stretches, DN PRN, modalities PRN   Consulted and Agree with Plan of Care Patient      Patient will benefit from skilled therapeutic intervention in order to improve the following deficits and impairments:  Abnormal gait,  Increased fascial restricitons, Increased muscle spasms, Difficulty walking, Decreased mobility, Decreased strength, Impaired flexibility, Pain  Visit Diagnosis: Pain in left hip  Other abnormalities of gait and mobility     Problem List Patient Active Problem List   Diagnosis Date Noted  . Peripheral edema 07/28/2016  . Urticaria 07/28/2016  . Physical exam 03/05/2016  . Allergic rhinitis 10/31/2015  . Severe obesity (BMI >= 40) ([place]) 08/29/2015  . Anxiety and depression 08/29/2015  . HTN (hypertension) 08/29/2015  . Hyperlipidemia 08/29/2015      Laureen Abrahams, PT, DPT 11/02/16 10:11 AM    [facility], [place], [address] Phone: [phone]   Fax:  [phone]  Name: KOURTNI STINEMAN MRN: [id number] Date of Birth: [birth year]    PHYSICAL THERAPY DISCHARGE SUMMARY  Visits from Start of Care: 6  Current functional level related to goals / functional outcomes: See above   Remaining deficits: N/a; pt instructed to cancel remaining appts if doing well which she did; did not call to reschedule   Education / Equipment: HEP  Plan: Patient agrees to discharge.  Patient goals were not met. Patient is being discharged due to being pleased with the current functional level.  ?????    Laureen Abrahams, PT, DPT 12/03/16 9:02 AM   [facility] [address] [place], [place], [address] Phone: [phone]  Fax: [phone]

## 2016-12-21 ENCOUNTER — Other Ambulatory Visit: Payer: Self-pay | Admitting: Family Medicine

## 2016-12-21 DIAGNOSIS — R609 Edema, unspecified: Secondary | ICD-10-CM

## 2016-12-29 ENCOUNTER — Other Ambulatory Visit: Payer: Self-pay | Admitting: Family Medicine

## 2016-12-31 ENCOUNTER — Telehealth: Payer: Self-pay | Admitting: Family Medicine

## 2016-12-31 NOTE — Telephone Encounter (Signed)
RF request from [facility] for alprazolam 0.5mg  Last RF 10/12/16 # 60 no rf. Last OV 10.1.18 No Upcoming OV.  Please advise.

## 2017-01-01 MED ORDER — ALPRAZOLAM 0.5 MG PO TABS: ORAL_TABLET | ORAL | 0 refills | Status: DC

## 2017-01-01 NOTE — Telephone Encounter (Signed)
Refill sent to pharmacy but please remind pt to schedule upcoming CPE

## 2017-01-01 NOTE — Telephone Encounter (Signed)
LM for patient advising that medication was sent.   Asked patient to call to schedule CPE.

## 2017-03-02 ENCOUNTER — Encounter: Payer: Self-pay | Admitting: General Practice

## 2017-03-22 ENCOUNTER — Ambulatory Visit (INDEPENDENT_AMBULATORY_CARE_PROVIDER_SITE_OTHER): Payer: BC Managed Care – PPO | Admitting: Family Medicine

## 2017-03-22 ENCOUNTER — Encounter: Payer: Self-pay | Admitting: Family Medicine

## 2017-03-22 ENCOUNTER — Other Ambulatory Visit: Payer: Self-pay

## 2017-03-22 VITALS — BP 124/82 | HR 74 | Temp 98.3°F | Resp 16 | Ht 63.0 in | Wt 273.1 lb

## 2017-03-22 DIAGNOSIS — M170 Bilateral primary osteoarthritis of knee: Secondary | ICD-10-CM

## 2017-03-22 DIAGNOSIS — Z0000 Encounter for general adult medical examination without abnormal findings: Secondary | ICD-10-CM

## 2017-03-22 DIAGNOSIS — E559 Vitamin D deficiency, unspecified: Secondary | ICD-10-CM

## 2017-03-22 LAB — CBC WITH DIFFERENTIAL/PLATELET
BASOS PCT: 0.1 % (ref 0.0–3.0)
Basophils Absolute: 0 10*3/uL (ref 0.0–0.1)
EOS ABS: 0 10*3/uL (ref 0.0–0.7)
EOS PCT: 0 % (ref 0.0–5.0)
HEMATOCRIT: 38.3 % (ref 36.0–46.0)
HEMOGLOBIN: 12.8 g/dL (ref 12.0–15.0)
Lymphocytes Relative: 20.7 % (ref 12.0–46.0)
Lymphs Abs: 1.8 10*3/uL (ref 0.7–4.0)
MCHC: 33.4 g/dL (ref 30.0–36.0)
MCV: 81.3 fl (ref 78.0–100.0)
MONO ABS: 0.6 10*3/uL (ref 0.1–1.0)
Monocytes Relative: 6.6 % (ref 3.0–12.0)
NEUTROS ABS: 6.2 10*3/uL (ref 1.4–7.7)
Neutrophils Relative %: 72.6 % (ref 43.0–77.0)
Platelets: 267 10*3/uL (ref 150.0–400.0)
RBC: 4.7 Mil/uL (ref 3.87–5.11)
RDW: 16.2 % — AB (ref 11.5–15.5)
WBC: 8.6 10*3/uL (ref 4.0–10.5)

## 2017-03-22 LAB — BASIC METABOLIC PANEL
BUN: 16 mg/dL (ref 6–23)
CHLORIDE: 101 meq/L (ref 96–112)
CO2: 27 meq/L (ref 19–32)
Calcium: 9.5 mg/dL (ref 8.4–10.5)
Creatinine, Ser: 0.86 mg/dL (ref 0.40–1.20)
GFR: 71.91 mL/min (ref >60.00)
Glucose, Bld: 73 mg/dL (ref 70–99)
POTASSIUM: 3.2 meq/L — AB (ref 3.5–5.1)
SODIUM: 139 meq/L (ref 135–145)

## 2017-03-22 LAB — HEPATIC FUNCTION PANEL
ALK PHOS: 81 U/L (ref 39–117)
ALT: 20 U/L (ref 0–35)
AST: 19 U/L (ref 0–37)
Albumin: 4 g/dL (ref 3.5–5.2)
BILIRUBIN DIRECT: 0.1 mg/dL (ref 0.0–0.3)
BILIRUBIN TOTAL: 0.5 mg/dL (ref 0.2–1.2)
TOTAL PROTEIN: 6.7 g/dL (ref 6.0–8.3)

## 2017-03-22 LAB — LIPID PANEL
Cholesterol: 171 mg/dL (ref 0–200)
HDL: 53.8 mg/dL (ref >39.00)
LDL Cholesterol: 97 mg/dL (ref 0–99)
NONHDL: 116.86
Total CHOL/HDL Ratio: 3
Triglycerides: 101 mg/dL (ref 0.0–149.0)
VLDL: 20.2 mg/dL (ref 0.0–40.0)

## 2017-03-22 LAB — TSH: TSH: 1.99 u[IU]/mL (ref 0.35–4.50)

## 2017-03-22 LAB — VITAMIN D 25 HYDROXY (VIT D DEFICIENCY, FRACTURES): VITD: 17.32 ng/mL — ABNORMAL LOW (ref 30.00–100.00)

## 2017-03-22 NOTE — Assessment & Plan Note (Signed)
Ongoing issue for pt.  Stressed need for healthy diet and regular exercise but she is not able to exercise due to severe DJD of knees bilaterally.  Check labs to risk stratify.  Will follow.

## 2017-03-22 NOTE — Assessment & Plan Note (Signed)
Pt is to let me know if she is interested in a 2nd opinion for her knee arthritis.  Will refer if name is provided

## 2017-03-22 NOTE — Progress Notes (Signed)
   Subjective:    Patient ID: Ariana [name], female    DOB: May 29, 1960, 57 y.o.   MRN: [id number]  HPI CPE- UTD on pap, mammo, colonoscopy, Tdap.  Declines flu   Review of Systems Patient reports no vision/ hearing changes, adenopathy,fever, weight change,  persistant/recurrent hoarseness , swallowing issues, chest pain, palpitations, edema, persistant/recurrent cough, hemoptysis, gastrointestinal bleeding (melena, rectal bleeding), abdominal pain, significant heartburn, bowel changes, GU symptoms (dysuria, hematuria, incontinence), Gyn symptoms (abnormal  bleeding, pain),  syncope, focal weakness, memory loss, numbness & tingling, skin/hair/nail changes, abnormal bruising or bleeding, anxiety, or depression.   + SOB w/ exertion    Objective:   Physical Exam General Appearance:    Alert, cooperative, no distress, appears stated age, obese  Head:    Normocephalic, without obvious abnormality, atraumatic  Eyes:    PERRL, conjunctiva/corneas clear, EOM's intact, fundi    benign, both eyes  Ears:    Normal TM's and external ear canals, both ears  Nose:   Nares normal, septum midline, mucosa normal, no drainage    or sinus tenderness  Throat:   Lips, mucosa, and tongue normal; teeth and gums normal  Neck:   Supple, symmetrical, trachea midline, no adenopathy;    Thyroid: no enlargement/tenderness/nodules  Back:     Symmetric, no curvature, ROM normal, no CVA tenderness  Lungs:     Clear to auscultation bilaterally, respirations unlabored  Chest Wall:    No tenderness or deformity   Heart:    Regular rate and rhythm, S1 and S2 normal, no murmur, rub   or gallop  Breast Exam:    Deferred to GYN  Abdomen:     Soft, non-tender, bowel sounds active all four quadrants,    no masses, no organomegaly  Genitalia:    Deferred to GYN  Rectal:    Extremities:   Extremities normal, atraumatic, no cyanosis or edema  Pulses:   2+ and symmetric all extremities  Skin:   Skin color, texture, turgor  normal, no rashes or lesions  Lymph nodes:   Cervical, supraclavicular, and axillary nodes normal  Neurologic:   CNII-XII intact, normal strength, sensation and reflexes    throughout          Assessment & Plan:

## 2017-03-22 NOTE — Assessment & Plan Note (Signed)
Pt's PE WNL w/ exception of obesity.  UTD on GYN, colonoscopy, Tdap- declines flu.  Stressed need for healthy diet and regular exercise.  Check labs.  Anticipatory guidance provided.

## 2017-03-22 NOTE — Patient Instructions (Signed)
Follow up in 6 months to recheck BP, cholesterol, and weight loss progress We'll notify you of your lab results and make any changes if needed Let me know who you want to see and we can send a referral for the knee pain Continue to work on healthy diet and regular exercise- you can do it! Call with any questions or concerns Happy Spring!!

## 2017-03-22 NOTE — Assessment & Plan Note (Signed)
Pt has hx of this.  Check labs and replete prn. 

## 2017-03-23 ENCOUNTER — Other Ambulatory Visit: Payer: Self-pay | Admitting: General Practice

## 2017-03-23 MED ORDER — VITAMIN D (ERGOCALCIFEROL) 1.25 MG (50000 UNIT) PO CAPS
50000.0000 [IU] | ORAL_CAPSULE | ORAL | 0 refills | Status: DC
Start: 2017-03-23 — End: 2017-07-22

## 2017-03-23 MED ORDER — POTASSIUM CHLORIDE CRYS ER 20 MEQ PO TBCR: 20.0000 meq | EXTENDED_RELEASE_TABLET | Freq: Every day | ORAL | 3 refills | Status: DC

## 2017-04-01 ENCOUNTER — Other Ambulatory Visit: Payer: Self-pay | Admitting: Family Medicine

## 2017-04-01 NOTE — Telephone Encounter (Signed)
Last OV 03/22/17 Alprazolam last filled 12/22/16 #60 with 0 prozac last filled 12/31/16 #30 with 0

## 2017-04-17 ENCOUNTER — Other Ambulatory Visit: Payer: Self-pay | Admitting: Family Medicine

## 2017-04-26 ENCOUNTER — Other Ambulatory Visit: Payer: Self-pay | Admitting: Emergency Medicine

## 2017-04-26 MED ORDER — FLUOXETINE HCL 40 MG PO CAPS: ORAL_CAPSULE | ORAL | 1 refills | Status: DC

## 2017-06-07 ENCOUNTER — Other Ambulatory Visit: Payer: Self-pay | Admitting: Family Medicine

## 2017-07-21 ENCOUNTER — Ambulatory Visit: Payer: Self-pay

## 2017-07-21 NOTE — Telephone Encounter (Signed)
Pt. called to report having increased episodes of shortness of breath over past 1-2 months.  Reported the episodes are occurring more frequently.  Reported the last episode was Sunday, as she was walking around [place].  Reported the longer that she walked around, the more labored her breathing became, and was unable to sit down.  She went to her car, and had to sit for about 5 minutes to get her breath.  Denied chest pain, heart palpitations, or dizziness. Denied chest congestion, cough, or fever.  Denied pain with deep breath.  Stated she gets "a feeling that starts in the top of her stomach, like a tightness or a lump", prior to the onset of shortness of breath.  Reported "mild swelling" in tops of feet and ankles, bilaterally.  Stated that she always has some swelling in feet and ankles.  Denied any shortness of breath today.  Reported she has Osteo-Arthritis in her knees, and has bone-on-bone; questioned if her increased pain could be causing her shortness of breath with walking.  Advised will make appt. for evaluation.  Sched. Appt. tomorrow with PCP.  Care advice given per protocol.  Pt. Verb. Understanding.  Agrees with plan.  Knows to call back or go to ER with worsening symptoms.           Reason for Disposition . [1] MILD difficulty breathing (e.g., minimal/no SOB at rest, SOB with walking, pulse <100) AND [2] NEW-onset or WORSE than normal    Reports increasing episodes of shortness of breath with activity of walking for period of time, or walking up incline  Answer Assessment - Initial Assessment Questions 1. RESPIRATORY STATUS: "Describe your breathing?" (e.g., wheezing, shortness of breath, unable to speak, severe coughing)      Feeling starts in top of stomach that getting short of breath; rapid breathing  2. ONSET: "When did this breathing problem begin?"      Over past couple of months 3. PATTERN "Does the difficult breathing come and go, or has it been constant since it started?"   Intermittent; had an episode on Sun. when walking in [employer] 4. SEVERITY: "How bad is your breathing?" (e.g., mild, moderate, severe)    - MILD: No SOB at rest, mild SOB with walking, speaks normally in sentences, can lay down, no retractions, pulse < 100.    - MODERATE: SOB at rest, SOB with minimal exertion and prefers to sit, cannot lie down flat, speaks in phrases, mild retractions, audible wheezing, pulse 100-120.    - SEVERE: Very SOB at rest, speaks in single words, struggling to breathe, sitting hunched forward, retractions, pulse > 120      Moderate to severe, depending on activity 5. RECURRENT SYMPTOM: "Have you had difficulty breathing before?" If so, ask: "When was the last time?" and "What happened that time?"      On Sun. While walking in a store  6. CARDIAC HISTORY: "Do you have any history of heart disease?" (e.g., heart attack, angina, bypass surgery, angioplasty)      Denied heart problems ; family hx of heart problems 7. LUNG HISTORY: "Do you have any history of lung disease?"  (e.g., pulmonary embolus, asthma, emphysema)     Never been diagnosed  97. CAUSE: "What do you think is causing the breathing problem?"      unknown 9. OTHER SYMPTOMS: "Do you have any other symptoms? (e.g., dizziness, runny nose, cough, chest pain, fever)     C/o feeling a tightness in upper abdomen, prior to  onset of shortness of breath; mild swelling in bilat. Tops of feet and ankles;  denies chest pain, denied cough or chest congestion; denied fever/ chills   10. PREGNANCY: "Is there any chance you are pregnant?" "When was your last menstrual period?"       N/A  11. TRAVEL: "Have you traveled out of the country in the last month?" (e.g., travel history, exposures)       denied  Protocols used: BREATHING DIFFICULTY-A-AH

## 2017-07-22 ENCOUNTER — Ambulatory Visit: Payer: BC Managed Care – PPO | Admitting: Family Medicine

## 2017-07-22 ENCOUNTER — Encounter: Payer: Self-pay | Admitting: Family Medicine

## 2017-07-22 ENCOUNTER — Other Ambulatory Visit: Payer: Self-pay

## 2017-07-22 VITALS — BP 123/81 | HR 87 | Temp 98.0°F | Resp 16 | Ht 63.0 in | Wt 275.2 lb

## 2017-07-22 DIAGNOSIS — R0602 Shortness of breath: Secondary | ICD-10-CM

## 2017-07-22 DIAGNOSIS — K219 Gastro-esophageal reflux disease without esophagitis: Secondary | ICD-10-CM | POA: Insufficient documentation

## 2017-07-22 LAB — CBC WITH DIFFERENTIAL/PLATELET
BASOS ABS: 0 10*3/uL (ref 0.0–0.1)
BASOS PCT: 0.1 % (ref 0.0–3.0)
EOS PCT: 0 % (ref 0.0–5.0)
Eosinophils Absolute: 0 10*3/uL (ref 0.0–0.7)
HEMATOCRIT: 41 % (ref 36.0–46.0)
Hemoglobin: 13.6 g/dL (ref 12.0–15.0)
LYMPHS ABS: 1.6 10*3/uL (ref 0.7–4.0)
Lymphocytes Relative: 16.9 % (ref 12.0–46.0)
MCHC: 33.1 g/dL (ref 30.0–36.0)
MCV: 80.1 fl (ref 78.0–100.0)
MONOS PCT: 8.7 % (ref 3.0–12.0)
Monocytes Absolute: 0.8 10*3/uL (ref 0.1–1.0)
NEUTROS ABS: 6.9 10*3/uL (ref 1.4–7.7)
Neutrophils Relative %: 74.3 % (ref 43.0–77.0)
PLATELETS: 279 10*3/uL (ref 150.0–400.0)
RBC: 5.13 Mil/uL — ABNORMAL HIGH (ref 3.87–5.11)
RDW: 15.9 % — AB (ref 11.5–15.5)
WBC: 9.3 10*3/uL (ref 4.0–10.5)

## 2017-07-22 LAB — BASIC METABOLIC PANEL
BUN: 17 mg/dL (ref 6–23)
CALCIUM: 9.9 mg/dL (ref 8.4–10.5)
CO2: 33 meq/L — AB (ref 19–32)
CREATININE: 0.9 mg/dL (ref 0.40–1.20)
Chloride: 100 mEq/L (ref 96–112)
GFR: 68.15 mL/min (ref >60.00)
GLUCOSE: 84 mg/dL (ref 70–99)
Potassium: 3.8 mEq/L (ref 3.5–5.1)
Sodium: 143 mEq/L (ref 135–145)

## 2017-07-22 LAB — TSH: TSH: 2.03 u[IU]/mL (ref 0.35–4.50)

## 2017-07-22 MED ORDER — OMEPRAZOLE 40 MG PO CPDR: 40.0000 mg | DELAYED_RELEASE_CAPSULE | Freq: Every day | ORAL | 3 refills | Status: DC

## 2017-07-22 NOTE — Patient Instructions (Signed)
Follow up as needed or as scheduled We'll notify you of your lab results and make any changes if needed Your EKG looks great! We'll call you with your pulmonary appt to work up the shortness of breath START the Omeprazole once daily to improve your acid reflux (this can also cause shortness of breath) Try and get regular activity to improve your stamina Call with any questions or concerns Hang in there!

## 2017-07-22 NOTE — Progress Notes (Signed)
   Subjective:    Patient ID: Ariana [name], female    DOB: December 07, 1960, 57 y.o.   MRN: [id number]  HPI SOB- 'at times I have trouble catching my breath'.  No issues w/ day to day activity but sxs occur w/ exertion.  sxs first started in December.  Now occurs when walking around [place] or going to mailbox.  No CP.  Will at times develop a pain in epigastric area preceding SOB but the pain is not required for SOB to develop.  No difficulty lying flat to sleep at night.  No triggers for epigastric pain.  sxs improve w/ rest.  Some increased GERD- particularly if she eats late and depends on what she eats.  SOB occurred yesterday while walking around yard.  No N/V.   Review of Systems For ROS see HPI     Objective:   Physical Exam  Constitutional: She is oriented to person, place, and time. She appears well-developed and well-nourished. No distress.  obese  HENT:  Head: Normocephalic and atraumatic.  Eyes: Pupils are equal, round, and reactive to light. Conjunctivae and EOM are normal.  Neck: Normal range of motion. Neck supple. No thyromegaly present.  Cardiovascular: Normal rate, regular rhythm, normal heart sounds and intact distal pulses.  No murmur heard. Pulmonary/Chest: Effort normal and breath sounds normal. No respiratory distress.  Abdominal: Soft. She exhibits no distension. There is no tenderness.  Musculoskeletal: She exhibits no edema.  Lymphadenopathy:    She has no cervical adenopathy.  Neurological: She is alert and oriented to person, place, and time.  Skin: Skin is warm and dry.  Psychiatric: She has a normal mood and affect. Her behavior is normal.  Vitals reviewed.         Assessment & Plan:  SOB w/ exertion- new.  Pt feels a lot of this is due to her physical deconditioning and I agree that this plays a large part.  Her body habitus also contributes.  I suspect there is also underlying GERD.  EKG WNL today and no complaints of chest pain.  Will check labs to  make sure there is not a metabolic cause of SOB (thyroid abnormality, anemia, etc).  Will refer to pulmonary for complete evaluation

## 2017-07-23 NOTE — Assessment & Plan Note (Signed)
New.  Pt has some sxs of GERD and indigestion.  This could be contributing to her SOB.  Based on this, will start daily PPI.  Dietary and lifestyle modifications discussed.

## 2017-07-30 ENCOUNTER — Ambulatory Visit: Payer: BC Managed Care – PPO | Admitting: Pulmonary Disease

## 2017-07-30 ENCOUNTER — Encounter: Payer: Self-pay | Admitting: Pulmonary Disease

## 2017-07-30 ENCOUNTER — Ambulatory Visit (INDEPENDENT_AMBULATORY_CARE_PROVIDER_SITE_OTHER)
Admission: RE | Admit: 2017-07-30 | Discharge: 2017-07-30 | Disposition: A | Discharge status: Home / Self Care | Payer: BC Managed Care – PPO | Source: Ambulatory Visit | Attending: Pulmonary Disease | Admitting: Pulmonary Disease

## 2017-07-30 ENCOUNTER — Other Ambulatory Visit: Payer: BC Managed Care – PPO

## 2017-07-30 ENCOUNTER — Other Ambulatory Visit (INDEPENDENT_AMBULATORY_CARE_PROVIDER_SITE_OTHER): Payer: BC Managed Care – PPO

## 2017-07-30 DIAGNOSIS — R0609 Other forms of dyspnea: Secondary | ICD-10-CM

## 2017-07-30 LAB — BRAIN NATRIURETIC PEPTIDE: PRO B NATRI PEPTIDE: 41 pg/mL (ref 0.0–100.0)

## 2017-07-30 NOTE — Patient Instructions (Signed)
Blood work and chest x-ray today. We will set up breathing test-spirometry pre-and post  Schedule echocardiogram for the heart  If above testing is normal then we would suggest a graduated exercise program and weight loss

## 2017-07-30 NOTE — Progress Notes (Signed)
Subjective:    Patient ID: Ariana [name], female    DOB: [birth year], 57 y.o.   MRN: [id number]  HPI  Chief Complaint  Patient presents with  . Pulm Consult    Referred by Dr. Birdie Riddle for SOB. Per patient she is not able to walk long distances due to SOB.    57 year old obese woman presents for evaluation of dyspnea on exertion. This is been ongoing for a year and seems to be worse in extreme heat or cold.  She reports getting short of breath while walking in [employer] or in the parking lot.  She is able to get through her daily chores.  Just stopping and resting seems to improve her symptoms.  She also reports an occasional dry cough but denies wheezing.  She has occasional pedal edema for fluid she takes a water pill but has never been diagnosed with heart failure.  She denies childhood history of asthma.  She smoked for about 5 years, less than 5 pack years before she quit at age 27.  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  She is separated and lives by herself and [place].  Past medical history includes hypertension, hyperlipidemia, osteoarthritis both knees for which she is deferring knee replacement. Past surgical history- right oophorectomy 2009  Strong family history of cardiac disease in her dad side of the family       Past Medical History:  Diagnosis Date  . Anxiety   . Depression   . Hyperlipidemia   . Hypertension   . OA (osteoarthritis)    in Bil knees/ gets steroid injections for pain  . Ovarian cyst    right ovary/had ablation     Past Surgical History:  Procedure Laterality Date  . ENDOMETRIAL ABLATION  2012  . OVARIAN CYST REMOVAL Right 2012    Allergies  Allergen Reactions  . Penicillins Hives    Social History   Socioeconomic History  . Marital status: Legally Separated    Spouse name: Not on file  . Number of children: Not on file  . Years of education: Not on  file  . Highest education level: Not on file  Occupational History  . Not on file  Social Needs  . Financial resource strain: Not on file  . Food insecurity:    Worry: Not on file    Inability: Not on file  . Transportation needs:    Medical: Not on file    Non-medical: Not on file  Tobacco Use  . Smoking status: Never Smoker  . Smokeless tobacco: Never Used  Substance and Sexual Activity  . Alcohol use: No  . Drug use: No  . Sexual activity: Never  Lifestyle  . Physical activity:    Days per week: Not on file    Minutes per session: Not on file  . Stress: Not on file  Relationships  . Social connections:    Talks on phone: Not on file    Gets together: Not on file    Attends religious service: Not on file    Active member of club or organization: Not on file    Attends meetings of clubs or organizations: Not on file    Relationship status: Not on file  . Intimate partner violence:    Fear of current or ex partner: Not on file    Emotionally abused: Not on file    Physically abused: Not on  file    Forced sexual activity: Not on file  Other Topics Concern  . Not on file  Social History Narrative  . Not on file     Family History  Problem Relation Age of Onset  . Dementia Mother   . Heart attack Father   . Heart disease Father   . Arthritis Father   . Hyperlipidemia Father   . Hypertension Father   . Diabetes Brother   . Arthritis Brother   . Heart disease Brother   . Liver disease Brother      Review of Systems  Constitutional: Negative for fever and unexpected weight change.  HENT: Negative for congestion, dental problem, ear pain, nosebleeds, postnasal drip, rhinorrhea, sinus pressure, sneezing, sore throat and trouble swallowing.   Eyes: Negative for redness and itching.  Respiratory: Positive for shortness of breath. Negative for cough, chest tightness and wheezing.   Cardiovascular: Negative for palpitations and leg swelling.  Gastrointestinal:  Negative for nausea and vomiting.  Genitourinary: Negative for dysuria.  Musculoskeletal: Positive for joint swelling.  Skin: Negative for rash.  Allergic/Immunologic: Negative.  Negative for environmental allergies, food allergies and immunocompromised state.  Neurological: Negative for headaches.  Hematological: Does not bruise/bleed easily.  Psychiatric/Behavioral: Negative for dysphoric mood. The patient is nervous/anxious.        Objective:   Physical Exam   Gen. Pleasant, obese, in no distress, normal affect ENT - no enlarged tonsils, no post nasal drip, class 2-3 airway Neck: No JVD, no thyromegaly, no carotid bruits Lungs: no use of accessory muscles, no dullness to percussion, decreased without rales or rhonchi  Cardiovascular: Rhythm regular, heart sounds  normal, no murmurs or gallops, no peripheral edema Abdomen: soft and non-tender, no hepatosplenomegaly, BS normal. Musculoskeletal: No deformities, no cyanosis or clubbing Neuro:  alert, non focal, no tremors        Assessment & Plan:

## 2017-07-30 NOTE — Assessment & Plan Note (Addendum)
Blood work and chest x-ray today. We will set up breathing test-spirometry pre-and post  Schedule echocardiogram given strong family history of cardiac disease  If above testing is normal then we would suggest a graduated exercise program and weight loss Obesity and deconditioning seem to be obvious causes but I think we would have to rule out other simple causes first

## 2017-07-31 LAB — D-DIMER, QUANTITATIVE: D-Dimer, Quant: 0.39 mcg/mL FEU (ref <0.50)

## 2017-08-02 ENCOUNTER — Telehealth: Payer: Self-pay | Admitting: Pulmonary Disease

## 2017-08-02 NOTE — Telephone Encounter (Signed)
Notes recorded by Rigoberto Noel, MD on 07/30/2017 at 4:37 PM EDT Chest x-ray appears clear  Notes recorded by Rigoberto Noel, MD on 08/02/2017 at 7:46 AM EDT Blood work neg for fluid build up & blood clot  Called and spoke with pt letting her know the results of the labwork and cxr. Pt expressed understanding. Nothing further needed.

## 2017-08-04 ENCOUNTER — Other Ambulatory Visit: Payer: Self-pay

## 2017-08-04 ENCOUNTER — Ambulatory Visit (HOSPITAL_COMMUNITY): Payer: BC Managed Care – PPO | Attending: Cardiovascular Disease

## 2017-08-04 DIAGNOSIS — E785 Hyperlipidemia, unspecified: Secondary | ICD-10-CM | POA: Diagnosis not present

## 2017-08-04 DIAGNOSIS — I10 Essential (primary) hypertension: Secondary | ICD-10-CM | POA: Diagnosis not present

## 2017-08-04 DIAGNOSIS — R609 Edema, unspecified: Secondary | ICD-10-CM | POA: Diagnosis not present

## 2017-08-04 DIAGNOSIS — R0609 Other forms of dyspnea: Secondary | ICD-10-CM | POA: Diagnosis not present

## 2017-08-16 ENCOUNTER — Encounter: Payer: Self-pay | Admitting: Primary Care

## 2017-08-16 ENCOUNTER — Ambulatory Visit (INDEPENDENT_AMBULATORY_CARE_PROVIDER_SITE_OTHER): Payer: BC Managed Care – PPO | Admitting: Primary Care

## 2017-08-16 ENCOUNTER — Ambulatory Visit: Payer: BC Managed Care – PPO | Admitting: Primary Care

## 2017-08-16 DIAGNOSIS — R0609 Other forms of dyspnea: Secondary | ICD-10-CM

## 2017-08-16 DIAGNOSIS — K219 Gastro-esophageal reflux disease without esophagitis: Secondary | ICD-10-CM

## 2017-08-16 LAB — PULMONARY FUNCTION TEST
FEF 25-75 Post: 1.52 L/sec
FEF 25-75 Pre: 1.31 L/sec
FEF2575-%CHANGE-POST: 16 %
FEF2575-%Pred-Post: 67 %
FEF2575-%Pred-Pre: 57 %
FEV1-%Change-Post: 2 %
FEV1-%Pred-Post: 81 %
FEV1-%Pred-Pre: 79 %
FEV1-Post: 1.94 L
FEV1-Pre: 1.9 L
FEV1FVC-%Change-Post: 2 %
FEV1FVC-%Pred-Pre: 94 %
FEV6-%Change-Post: 0 %
FEV6-%Pred-Post: 87 %
FEV6-%Pred-Pre: 86 %
FEV6-Post: 2.58 L
FEV6-Pre: 2.57 L
FEV6FVC-%Pred-Post: 104 %
FEV6FVC-%Pred-Pre: 104 %
FVC-%CHANGE-POST: 0 %
FVC-%PRED-POST: 83 %
FVC-%PRED-PRE: 83 %
FVC-PRE: 2.57 L
FVC-Post: 2.58 L
POST FEV1/FVC RATIO: 75 %
Post FEV6/FVC ratio: 100 %
Pre FEV1/FVC ratio: 74 %
Pre FEV6/FVC Ratio: 100 %

## 2017-08-16 MED ORDER — FLUTICASONE FUROATE-VILANTEROL 100-25 MCG/INH IN AEPB: 1.0000 | INHALATION_SPRAY | Freq: Every day | RESPIRATORY_TRACT | 5 refills | Status: DC

## 2017-08-16 MED ORDER — ALBUTEROL SULFATE HFA 108 (90 BASE) MCG/ACT IN AERS
2.0000 | INHALATION_SPRAY | Freq: Four times a day (QID) | RESPIRATORY_TRACT | 6 refills | Status: DC | PRN
Start: 2017-08-16 — End: 2019-01-19

## 2017-08-16 NOTE — Assessment & Plan Note (Addendum)
-   Encouraged regular physical exercise and weight loss (recommend weight watchers, could consider referral to center for healthy weight loss)

## 2017-08-16 NOTE — Progress Notes (Signed)
@Patient  ID: Ariana [name], female    DOB: 1960/11/11, 57 y.o.   MRN: [id number]  Chief Complaint  Patient presents with  . Follow-up    PFT (spiro pre and post only) today    Referring provider: Midge Minium, MD  HPI: 57 year old female, former smoker as a teenager for 3-4 years (quit 1982). Hx HTN, OA, GERD, obesity. Patient of Dr. Elsworth Soho, seen for the first time by pulmonary on 07/30/17 to evaluate dyspnea on exertion.   07/30/17 Dyspnea has been ongoing for a year and seems to be worse in extreme heat or cold.  She reports getting short of breath while walking in [employer] or in the parking lot.  She is able to get through her daily chores.  Just stopping and resting seems to improve her symptoms.  She also reports an occasional dry cough but denies wheezing.  She has occasional pedal edema for fluid she takes a water pill but has never been diagnosed with heart failure.  She denies childhood history of asthma.  She smoked for about 5 years, less than 5 pack years before she quit at age 58.  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  She is separated and lives by herself and [place].  Past medical history includes hypertension, hyperlipidemia, osteoarthritis both knees for which she is deferring knee replacement. Past surgical history- right oophorectomy 2009  Strong family history of cardiac disease in her dad side of the family   08/16/2017 Patient presents for 2 week fu. PFT completed today, no evidence of obstruction. Mild small airway disease. She experiences sob with activity such as walking up a hill or shopping. Denies wheezing. Echocardiogram showed EF 60-65%. LV with normal systolic function. Grade 2 diastolic dysfunction. She takes 46m lasix daily, HCTZ and continues on simvastatin. She has been to curves in the past and lost 40 lbs.   Testing: CXR 07/30/17 - No acute cardiopulmonary disease    D-dimer- negative BNP - 41    Allergies  Allergen Reactions  . Penicillins Hives    Immunization History  Administered Date(s) Administered  . Tdap 08/29/2015    Past Medical History:  Diagnosis Date  . Anxiety   . Depression   . Hyperlipidemia   . Hypertension   . OA (osteoarthritis)    in Bil knees/ gets steroid injections for pain  . Ovarian cyst    right ovary/had ablation    Tobacco History: Social History   Tobacco Use  Smoking Status Never Smoker  Smokeless Tobacco Never Used   Counseling given: Not Answered   Outpatient Medications Prior to Visit  Medication Sig Dispense Refill  . ALPRAZolam (XANAX) 0.5 MG tablet TAKE 1 TABLET BY MOUTH TWICE A DAY AS NEEDED FOR ANXIETY 60 tablet 0  . amLODipine (NORVASC) 5 MG tablet TAKE 1 TABLET BY MOUTH EVERY DAY 90 tablet 1  . buPROPion (WELLBUTRIN XL) 300 MG 24 hr tablet TAKE 1 TABLET BY MOUTH EVERY DAY 90 tablet 1  . FLUoxetine (PROZAC) 40 MG capsule TAKE 1 CAPSULE BY MOUTH EVERY DAY 90 capsule 1  . furosemide (LASIX) 20 MG tablet Take 1 tablet (20 mg total) by mouth daily. Please call [phone] to schedule a Blood pressure follow up 30 tablet 3  . hydrochlorothiazide (HYDRODIURIL) 25 MG tablet TAKE 1 TABLET BY MOUTH EVERY DAY 90 tablet 1  . ibuprofen (ADVIL,MOTRIN) 200 MG tablet Take 200 mg  by mouth as needed.    Marland Kitchen omeprazole (PRILOSEC) 40 MG capsule Take 1 capsule (40 mg total) by mouth daily. 30 capsule 3  . potassium chloride SA (K-DUR,KLOR-CON) 20 MEQ tablet Take 1 tablet (20 mEq total) by mouth daily. 30 tablet 3  . simvastatin (ZOCOR) 40 MG tablet TAKE 1 TABLET BY MOUTH EVERY DAY 90 tablet 1   No facility-administered medications prior to visit.     Review of Systems  Review of Systems  Constitutional: Negative.   HENT: Negative.   Respiratory: Positive for cough and shortness of breath. Negative for wheezing.   Cardiovascular: Negative.   Skin: Negative.    Physical Exam  BP 132/78 (BP Location:  Right Arm, Cuff Size: Normal)   Pulse 80   Ht 5' 1.81" (1.57 m)   Wt 276 lb (125.2 kg)   SpO2 96%   BMI 50.79 kg/m  Physical Exam  Constitutional: She is oriented to person, place, and time. She appears well-developed and well-nourished.  HENT:  Head: Normocephalic and atraumatic.  Eyes: Pupils are equal, round, and reactive to light. EOM are normal.  Neck: Normal range of motion. Neck supple.  Cardiovascular: Normal rate, regular rhythm and normal heart sounds.  No murmur heard. Pulmonary/Chest: Effort normal and breath sounds normal. No respiratory distress. She has no wheezes.  Abdominal: Soft. Bowel sounds are normal. There is no tenderness.  Neurological: She is alert and oriented to person, place, and time.  Skin: Skin is warm and dry. No rash noted. No erythema.  Psychiatric: She has a normal mood and affect. Her behavior is normal. Judgment normal.     Lab Results:  CBC    Component Value Date/Time   WBC 9.3 07/22/2017 1504   RBC 5.13 (H) 07/22/2017 1504   HGB 13.6 07/22/2017 1504   HCT 41.0 07/22/2017 1504   PLT 279.0 07/22/2017 1504   MCV 80.1 07/22/2017 1504   MCH 26.6 (L) 03/05/2016 1609   MCHC 33.1 07/22/2017 1504   RDW 15.9 (H) 07/22/2017 1504   LYMPHSABS 1.6 07/22/2017 1504   MONOABS 0.8 07/22/2017 1504   EOSABS 0.0 07/22/2017 1504   BASOSABS 0.0 07/22/2017 1504    BMET    Component Value Date/Time   NA 143 07/22/2017 1504   K 3.8 07/22/2017 1504   CL 100 07/22/2017 1504   CO2 33 (H) 07/22/2017 1504   GLUCOSE 84 07/22/2017 1504   BUN 17 07/22/2017 1504   CREATININE 0.90 07/22/2017 1504   CREATININE 0.96 03/05/2016 1609   CALCIUM 9.9 07/22/2017 1504   GFRNONAA >60 01/21/2007 1029   GFRAA  01/21/2007 1029    >60        The eGFR has been calculated using the MDRD equation. This calculation has not been validated in all clinical    BNP No results found for: BNP  ProBNP    Component Value Date/Time   PROBNP 41.0 07/30/2017 1553     Imaging: Dg Chest 2 View  Result Date: 07/30/2017 CLINICAL DATA:  Dyspnea for 1 year EXAM: CHEST - 2 VIEW COMPARISON:  None. FINDINGS: The heart size and mediastinal contours are within normal limits. Both lungs are clear. The visualized skeletal structures are unremarkable. IMPRESSION: No active cardiopulmonary disease. Electronically Signed   By: Inez Catalina M.D.   On: 07/30/2017 16:08     Assessment & Plan:   DOE (dyspnea on exertion) - PFT 08/16/17- FVC 2.57 (83%), FEV1 1.90 (79%), Ratio 74 (94%), FEF 25-75% 1.31 (57*)  -  No evidence of obstruction. Mild small airway disease. Therapeutic trial Breo ellipta and prn albuterol hfa   - FU in 6 months with Dr. Elsworth Soho   GERD (gastroesophageal reflux disease) - Continues omeprazole   Severe obesity (BMI >= 40) (HCC) - Encouraged regular physical exercise and weight loss (recommend weight watchers, could consider referral to center for healthy weight loss)     Martyn Ehrich, NP 08/16/2017

## 2017-08-16 NOTE — Assessment & Plan Note (Addendum)
-   PFT 08/16/17- FVC 2.57 (83%), FEV1 1.90 (79%), Ratio 74 (94%), FEF 25-75% 1.31 (57*)  - No evidence of obstruction. Mild small airway disease. Therapeutic trial Breo ellipta and prn albuterol hfa   - FU in 6 months with Dr. Elsworth Soho

## 2017-08-16 NOTE — Assessment & Plan Note (Signed)
-   Continues omeprazole

## 2017-08-16 NOTE — Patient Instructions (Addendum)
Pulmonary function test normal today, mild small airway disease. No copd.   Therapeutic trial Breo Ellipta - 1 puff daily  Work on weight loss and exercise  Walk 20min daily 3-5 times a week Look into weight watchers  Could consider referral to healthy weight loss center  Speak with primary care about follow up with cardiology   FU in 6 months with Dr. Elsworth Soho

## 2017-08-16 NOTE — Progress Notes (Signed)
PFT completed today. 08/16/17

## 2017-08-23 ENCOUNTER — Other Ambulatory Visit: Payer: Self-pay | Admitting: Family Medicine

## 2017-08-23 NOTE — Telephone Encounter (Signed)
Last OV 07/22/17 Alprazolam last filled 04/01/17 #60 with 0

## 2017-08-25 ENCOUNTER — Other Ambulatory Visit: Payer: Self-pay

## 2017-08-25 ENCOUNTER — Telehealth: Payer: Self-pay | Admitting: Emergency Medicine

## 2017-08-25 DIAGNOSIS — I5189 Other ill-defined heart diseases: Secondary | ICD-10-CM

## 2017-08-25 NOTE — Telephone Encounter (Signed)
According to Dr Bari Mantis note, she is to use the [facility] daily and f/u w/ him in 6 months.  If all is going well, no need to make special f/u w/ me.

## 2017-08-25 NOTE — Telephone Encounter (Signed)
Called patient and gave her message from Dr. Birdie Riddle. Patient stated that any cardiology referral will be fine.   Patient also stated that in her paperwork from Pulmonology it stated that she had mild small airway disease, no COPD, and she was prescribed an inhaler and wanted to know if she is to follow up with you on this or what does she need to do?  Please advise.

## 2017-08-25 NOTE — Telephone Encounter (Signed)
Last OV 07/22/17, No future OV  Please advise.

## 2017-08-25 NOTE — Telephone Encounter (Signed)
Copied from [place] [id number]. Topic: General - Other >> Aug 24, 2017  3:59 PM Carolyn Stare wrote: Pt callto say she had the appt with pulmonary and she had a ECHO and there was a issue and said pt need to see a Cardiologist and to let her pcp know. Pt would like a call back   [phone]

## 2017-08-25 NOTE — Telephone Encounter (Signed)
I reviewed her ECHO and it does show an abnormal relaxation pattern (diastolic dysfunction).  Based on this, I am happy to refer to Cardiology for further evaluation/ongoing treatment

## 2017-08-25 NOTE — Telephone Encounter (Signed)
Called patient and gave her message from Dr. Birdie Riddle. Patient verbalized an understanding.

## 2017-08-26 ENCOUNTER — Telehealth: Payer: Self-pay | Admitting: Pulmonary Disease

## 2017-08-26 MED ORDER — FLUTICASONE FUROATE-VILANTEROL 100-25 MCG/INH IN AEPB: 1.0000 | INHALATION_SPRAY | Freq: Every day | RESPIRATORY_TRACT | 5 refills | Status: DC

## 2017-08-26 NOTE — Telephone Encounter (Signed)
Pt is returning call. Cb is [phone].

## 2017-08-26 NOTE — Telephone Encounter (Signed)
lmtcb x1 for pt. 

## 2017-08-26 NOTE — Telephone Encounter (Signed)
Called and spoke to pt.  Pt is requesting refill on Breo 100, as she feels this medication is effective.  Rx for [facility] has been sent to pharmacy.  Nothing further is needed.

## 2017-10-14 ENCOUNTER — Other Ambulatory Visit: Payer: Self-pay | Admitting: General Practice

## 2017-10-14 ENCOUNTER — Other Ambulatory Visit: Payer: Self-pay | Admitting: Family Medicine

## 2017-10-14 DIAGNOSIS — R609 Edema, unspecified: Secondary | ICD-10-CM

## 2017-10-14 MED ORDER — FUROSEMIDE 20 MG PO TABS: 20.0000 mg | ORAL_TABLET | Freq: Every day | ORAL | 3 refills | Status: DC | PRN

## 2017-10-28 ENCOUNTER — Encounter: Payer: Self-pay | Admitting: Cardiovascular Disease

## 2017-10-28 ENCOUNTER — Ambulatory Visit: Payer: BC Managed Care – PPO | Admitting: Cardiovascular Disease

## 2017-10-28 VITALS — BP 128/84 | HR 86 | Ht 61.81 in | Wt 268.4 lb

## 2017-10-28 DIAGNOSIS — R0789 Other chest pain: Secondary | ICD-10-CM

## 2017-10-28 DIAGNOSIS — R0609 Other forms of dyspnea: Secondary | ICD-10-CM | POA: Diagnosis not present

## 2017-10-28 DIAGNOSIS — I519 Heart disease, unspecified: Secondary | ICD-10-CM

## 2017-10-28 DIAGNOSIS — F329 Major depressive disorder, single episode, unspecified: Secondary | ICD-10-CM

## 2017-10-28 DIAGNOSIS — Z8249 Family history of ischemic heart disease and other diseases of the circulatory system: Secondary | ICD-10-CM

## 2017-10-28 DIAGNOSIS — I5189 Other ill-defined heart diseases: Secondary | ICD-10-CM

## 2017-10-28 DIAGNOSIS — E785 Hyperlipidemia, unspecified: Secondary | ICD-10-CM

## 2017-10-28 DIAGNOSIS — Z79899 Other long term (current) drug therapy: Secondary | ICD-10-CM

## 2017-10-28 DIAGNOSIS — F32A Depression, unspecified: Secondary | ICD-10-CM

## 2017-10-28 DIAGNOSIS — F419 Anxiety disorder, unspecified: Secondary | ICD-10-CM

## 2017-10-28 MED ORDER — METOPROLOL TARTRATE 100 MG PO TABS: ORAL_TABLET | ORAL | 0 refills | Status: DC

## 2017-10-28 MED ORDER — ROSUVASTATIN CALCIUM 20 MG PO TABS: 20.0000 mg | ORAL_TABLET | Freq: Every day | ORAL | 3 refills | Status: DC

## 2017-10-28 MED ORDER — SPIRONOLACTONE 25 MG PO TABS: 12.5000 mg | ORAL_TABLET | Freq: Two times a day (BID) | ORAL | 3 refills | Status: DC

## 2017-10-28 NOTE — Patient Instructions (Signed)
Medication Instructions:  STOP potassium STOP HCTZ START spironolactone 12.5 mg  --take daily for 1 week then increase to two times daily  STOP simvastatin START rosuvastatin (Crestor) 20 mg daily  If you need a refill on your cardiac medications before your next appointment, please call your pharmacy.   Lab work: Please return for FASTING labs in 4 weeks (CMET, Lipid)  Our in office lab hours are Monday-Friday 8:00-4:00, closed for lunch 12:45-1:45 pm.  No appointment needed.  If you have labs (blood work) drawn today and your tests are completely normal, you will receive your results only by: Marland Kitchen MyChart Message (if you have MyChart) OR . A paper copy in the mail If you have any lab test that is abnormal or we need to change your treatment, we will call you to review the results.  Testing/Procedures: Your physician has requested that you have cardiac CT. Cardiac computed tomography (CT) is a painless test that uses an x-ray machine to take clear, detailed pictures of your heart. For further information please visit HugeFiesta.tn. Please follow instruction sheet as given.  Follow-Up: At [facility], you and your health needs are our priority.  As part of our continuing mission to provide you with exceptional heart care, we have created designated Provider Care Teams.  These Care Teams include your primary Cardiologist (physician) and Advanced Practice Providers (APPs -  Physician Assistants and Nurse Practitioners) who all work together to provide you with the care you need, when you need it. You will need a follow up appointment in 6-8 weeks.  Please call our office 2 months in advance to schedule this appointment.  You may see Dr. Claiborne Billings or one of the following Advanced Practice Providers on your designated Care Team: [place], [place] . Fabian Sharp, PA-C

## 2017-10-28 NOTE — Progress Notes (Signed)
Cardiology Office Note    Date:  10/29/2017   ID:  Ariana [name], DOB [birth year], MRN [id number]  PCP:  Midge Minium, MD  Cardiologist:  Shelva Majestic, MD   New cardiology consultaion referred through the courtesy of Dr. Annye Asa for further evaluation of exertional dyspnea and grade 2 diastolic dysfunction.  History of Present Illness:  Ariana [name] is a 57 y.o. female who is referred through the courtesy of Dr. Birdie Riddle for evaluation of exertional shortness of breath and recent documentation of grade 2 diastolic dysfunction.  Ariana [name] denies any known cardiac history.  She has a history of obesity, hypertension, obstructive sleep apnea, GERD, and recently has started to notice a change in symptoms with development of exertional shortness of breath.  She had smoked during her late teenage years for approximately 3 to 4 years but quit in 1982.  She was recently evaluated from a pulmonary standpoint by Drs. Elsworth Soho and Daine Gravel.  Pulmonary function studies did not reveal evidence for obstruction.  She had mild small airway disease.  An echo Doppler study revealed an ejection fraction of 60 to 65% with normal wall motion and showed grade 2 diastolic dysfunction.  A chest x-ray did not reveal acute cardiopulmonary disease.  A d-dimer was negative.  BNP was 41.  Upon further questioning she states that recently she also has noticed some mild exertional chest tightness since April.  Oftentimes she develops this when she is out of breath.  If she were to walk fast she will have to stop to catch her breath.  She lives by herself.  Earlier this year she had laboratory which had shown a total cholesterol 171 LDL cholesterol 97 with triglycerides at 101.  At that time her potassium was low at 3.2.  She has recently been on amlodipine 5 mg and hydrochlorothiazide 25 mg in addition to supplemental potassium 20 mEq.  She has been on simvastatin 40 mg for hyperlipidemia.  She has  been taking Lasix as needed.  She does have a history of panic attacks and anxiety and depression for which she is on Xanax as well as Wellbutrin XL and Prozac.  She is now referred by Dr. Birdie Riddle for cardiology evaluation.  Past Medical History:  Diagnosis Date  . Anxiety   . Depression   . Hyperlipidemia   . Hypertension   . OA (osteoarthritis)    in Bil knees/ gets steroid injections for pain  . Ovarian cyst    right ovary/had ablation    Past Surgical History:  Procedure Laterality Date  . ENDOMETRIAL ABLATION  2012  . OVARIAN CYST REMOVAL Right 2012    Current Medications: Outpatient Medications Prior to Visit  Medication Sig Dispense Refill  . albuterol (PROVENTIL HFA;VENTOLIN HFA) 108 (90 Base) MCG/ACT inhaler Inhale 2 puffs into the lungs every 6 (six) hours as needed for wheezing or shortness of breath. 1 Inhaler 6  . ALPRAZolam (XANAX) 0.5 MG tablet TAKE 1 TABLET BY MOUTH TWICE A DAY AS NEEDED FOR ANXIETY 60 tablet 0  . amLODipine (NORVASC) 5 MG tablet TAKE 1 TABLET BY MOUTH EVERY DAY 90 tablet 1  . buPROPion (WELLBUTRIN XL) 300 MG 24 hr tablet TAKE 1 TABLET BY MOUTH EVERY DAY 90 tablet 1  . FLUoxetine (PROZAC) 40 MG capsule TAKE 1 CAPSULE BY MOUTH EVERY DAY 90 capsule 1  . fluticasone furoate-vilanterol (BREO ELLIPTA) 100-25 MCG/INH AEPB Inhale 1 puff into the lungs daily. 1 each 5  . furosemide (  LASIX) 20 MG tablet Take 1 tablet (20 mg total) by mouth daily as needed. 30 tablet 3  . ibuprofen (ADVIL,MOTRIN) 200 MG tablet Take 200 mg by mouth as needed.    Marland Kitchen omeprazole (PRILOSEC) 40 MG capsule Take 1 capsule (40 mg total) by mouth daily. 30 capsule 3  . hydrochlorothiazide (HYDRODIURIL) 25 MG tablet TAKE 1 TABLET BY MOUTH EVERY DAY 90 tablet 1  . potassium chloride SA (K-DUR,KLOR-CON) 20 MEQ tablet Take 1 tablet (20 mEq total) by mouth daily. 30 tablet 3  . simvastatin (ZOCOR) 40 MG tablet TAKE 1 TABLET BY MOUTH EVERY DAY 90 tablet 1   No facility-administered  medications prior to visit.      Allergies:   Penicillins   Social History   Socioeconomic History  . Marital status: Legally Separated    Spouse name: Not on file  . Number of children: Not on file  . Years of education: Not on file  . Highest education level: Not on file  Occupational History  . Not on file  Social Needs  . Financial resource strain: Not on file  . Food insecurity:    Worry: Not on file    Inability: Not on file  . Transportation needs:    Medical: Not on file    Non-medical: Not on file  Tobacco Use  . Smoking status: Never Smoker  . Smokeless tobacco: Never Used  . Tobacco comment: pt states she smoker for 3 years in high school (10/28/17)  Substance and Sexual Activity  . Alcohol use: No  . Drug use: No  . Sexual activity: Never  Lifestyle  . Physical activity:    Days per week: Not on file    Minutes per session: Not on file  . Stress: Not on file  Relationships  . Social connections:    Talks on phone: Not on file    Gets together: Not on file    Attends religious service: Not on file    Active member of club or organization: Not on file    Attends meetings of clubs or organizations: Not on file    Relationship status: Not on file  Other Topics Concern  . Not on file  Social History Narrative  . Not on file    She is separated and single.  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  There is no alcohol use.  She does not routinely exercise.  Family History:  The patient's family history includes Arthritis in her brother and father; Dementia in her mother; Diabetes in her brother; Heart attack in her father; Heart disease in her brother and father; Hyperlipidemia in her father; Hypertension in her father; Liver disease in her brother.  Her mother died at age 51 with dementia and had thyroid problems.  Her father died with a heart attack at age 71.  She has been depressed since her son age 40 died in a  motor vehicle accident.  ROS General: Negative; No fevers, chills, or night sweats;  HEENT: Negative; No changes in vision or hearing, sinus congestion, difficulty swallowing Pulmonary: Negative; No cough, wheezing, shortness of breath, hemoptysis Cardiovascular: Positive for recent diagnosis of asthma GI: Negative; No nausea, vomiting, diarrhea, or abdominal pain GU: Negative; No dysuria, hematuria, or difficulty voiding; status post right ovary and cyst removed in 2009 Musculoskeletal: Negative; no myalgias, joint pain, or weakness Hematologic/Oncology: Negative; no easy bruising, bleeding Endocrine: Negative; no heat/cold intolerance; no diabetes Neuro: Negative;  no changes in balance, headaches Skin: Negative; No rashes or skin lesions Psychiatric: Positive for anxiety and depression Sleep: Negative; No snoring, daytime sleepiness, hypersomnolence, bruxism, restless legs, hypnogognic hallucinations, no cataplexy Other comprehensive 14 point system review is negative.   PHYSICAL EXAM:   VS:  BP 128/84   Pulse 86   Ht 5' 1.81" (1.57 m)   Wt 268 lb 6.4 oz (121.7 kg)   BMI 49.39 kg/m     Repeat blood pressure by me 126/84  Wt Readings from Last 3 Encounters:  10/28/17 268 lb 6.4 oz (121.7 kg)  08/16/17 276 lb (125.2 kg)  07/30/17 275 lb 12.8 oz (125.1 kg)    General: Alert, oriented, no distress.  Skin: normal turgor, no rashes, warm and dry HEENT: Normocephalic, atraumatic. Pupils equal round and reactive to light; sclera anicteric; extraocular muscles intact; Fundi no hemorrhages or exudates. Nose without nasal septal hypertrophy Mouth/Parynx benign; Mallinpatti scale 3 Neck: No JVD, no carotid bruits; normal carotid upstroke Lungs: clear to ausculatation and percussion; no wheezing or rales Chest wall: without tenderness to palpitation Heart: PMI not displaced, RRR, s1 s2 normal, 1/6 systolic murmur, no diastolic murmur, no rubs, gallops, thrills, or heaves Abdomen:  soft, nontender; no hepatosplenomehaly, BS+; abdominal aorta nontender and not dilated by palpation. Back: no CVA tenderness Pulses 2+ Musculoskeletal: full range of motion, normal strength, no joint deformities Extremities: no clubbing cyanosis or edema, Homan's sign negative  Neurologic: grossly nonfocal; Cranial nerves grossly wnl Psychologic: Normal mood and affect   Studies/Labs Reviewed:   EKG:  EKG is ordered today. ECG (independently read by me): Normal sinus rhythm with isolated PVC.  Nonspecific ST-T changes.  PR interval 140 ms QTc interval 454 ms.  Recent Labs: BMP Latest Ref Rng & Units 07/22/2017 03/22/2017 08/03/2016  Glucose 70 - 99 mg/dL 84 73 79  BUN 6 - 23 mg/dL 17 16 16   Creatinine 0.40 - 1.20 mg/dL 0.90 0.86 0.82  Sodium 135 - 145 mEq/L 143 139 139  Potassium 3.5 - 5.1 mEq/L 3.8 3.2(L) 3.3(L)  Chloride 96 - 112 mEq/L 100 101 101  CO2 19 - 32 mEq/L 33(H) 27 31  Calcium 8.4 - 10.5 mg/dL 9.9 9.5 9.7     Hepatic Function Latest Ref Rng & Units 03/22/2017 03/05/2016 08/29/2015  Total Protein 6.0 - 8.3 g/dL 6.7 7.0 7.4  Albumin 3.5 - 5.2 g/dL 4.0 4.0 4.4  AST 0 - 37 U/L 19 30 20   ALT 0 - 35 U/L 20 25 25   Alk Phosphatase 39 - 117 U/L 81 75 86  Total Bilirubin 0.2 - 1.2 mg/dL 0.5 0.4 0.4  Bilirubin, Direct 0.0 - 0.3 mg/dL 0.1 0.1 0.1    CBC Latest Ref Rng & Units 07/22/2017 03/22/2017 03/05/2016  WBC 4.0 - 10.5 K/uL 9.3 8.6 7.9  Hemoglobin 12.0 - 15.0 g/dL 13.6 12.8 13.3  Hematocrit 36.0 - 46.0 % 41.0 38.3 40.5  Platelets 150.0 - 400.0 K/uL 279.0 267.0 269   Lab Results  Component Value Date   MCV 80.1 07/22/2017   MCV 81.3 03/22/2017   MCV 81.0 03/05/2016   Lab Results  Component Value Date   TSH 2.03 07/22/2017   Lab Results  Component Value Date   HGBA1C 5.3 08/29/2015     BNP No results found for: BNP  ProBNP    Component Value Date/Time   PROBNP 41.0 07/30/2017 1553     Lipid Panel     Component Value Date/Time   CHOL 171 03/22/2017 1504  TRIG 101.0 03/22/2017 1504   HDL 53.80 03/22/2017 1504   CHOLHDL 3 03/22/2017 1504   VLDL 20.2 03/22/2017 1504   LDLCALC 97 03/22/2017 1504   LDLDIRECT 99.0 08/29/2015 1417     RADIOLOGY: No results found.   Additional studies/ records that were reviewed today include:  I reviewed the office records of Dr. Annye Asa, the pulmonology evaluations, recent laboratory, echo Doppler assessment and PFTs.  ASSESSMENT:    1. DOE (dyspnea on exertion)   2. Chest tightness   3. Grade II diastolic dysfunction   4. Family history of early CAD   63. Medication management   6. Morbid obesity ([place])   7. Hyperlipidemia LDL goal <70   8. Anxiety and depression    PLAN:  Ariana [name] is a 57 year old female who has a history of morbid obesity, hypertension, anxiety/depression, and recently has noticed a definite change with development of increasing exertional dyspnea and also has noticed some exertional chest fullness and pressure.  She is not very active due to arthritis in her knees.  I reviewed her echo Doppler study.  With demonstration of grade 2 diastolic dysfunction I have suggested that in place of hydrochlorothiazide she initiate Spironolactone.  I have recommended she discontinue supplemental potassium as well as HCTZ and will initiate Spironolactone 12.5 mg for 1 week and then if tolerates will increase this to 12.5 mg twice a day.  With her morbid obesity I do not feel she is a good candidate for stress nuclear imaging due to high likelihood for attenuation artifact.  With her father dying of an MI at age 59 and her recent symptoms of exertional dyspnea with chest fullness I am scheduling her for CT coronary angios for further evaluation.  We discussed subclinical atherosclerosis and aggressive lipid-lowering therapy data regarding potential regression of plaque if present.  Her recent laboratory has shown an LDL cholesterol at 97.  I have recommended more aggressive lipid therapy  and will discontinue simvastatin and start rosuvastatin 20 mg.  In 6 to 8 weeks she will undergo repeat laboratory.  She is on multiple medications for anxiety depression.  QTc interval is 454.  I will see her in follow-up and further recommendations will be made at that time.   Medication Adjustments/Labs and Tests Ordered: Current medicines are reviewed at length with the patient today.  Concerns regarding medicines are outlined above.  Medication changes, Labs and Tests ordered today are listed in the Patient Instructions below. Patient Instructions  Medication Instructions:  STOP potassium STOP HCTZ START spironolactone 12.5 mg  --take daily for 1 week then increase to two times daily  STOP simvastatin START rosuvastatin (Crestor) 20 mg daily  If you need a refill on your cardiac medications before your next appointment, please call your pharmacy.   Lab work: Please return for FASTING labs in 4 weeks (CMET, Lipid)  Our in office lab hours are Monday-Friday 8:00-4:00, closed for lunch 12:45-1:45 pm.  No appointment needed.  If you have labs (blood work) drawn today and your tests are completely normal, you will receive your results only by: Marland Kitchen MyChart Message (if you have MyChart) OR . A paper copy in the mail If you have any lab test that is abnormal or we need to change your treatment, we will call you to review the results.  Testing/Procedures: Your physician has requested that you have cardiac CT. Cardiac computed tomography (CT) is a painless test that uses an x-ray machine to take clear, detailed pictures  of your heart. For further information please visit HugeFiesta.tn. Please follow instruction sheet as given.  Follow-Up: At [facility], you and your health needs are our priority.  As part of our continuing mission to provide you with exceptional heart care, we have created designated Provider Care Teams.  These Care Teams include your primary Cardiologist  (physician) and Advanced Practice Providers (APPs -  Physician Assistants and Nurse Practitioners) who all work together to provide you with the care you need, when you need it. You will need a follow up appointment in 6-8 weeks.  Please call our office 2 months in advance to schedule this appointment.  You may see Dr. Claiborne Billings or one of the following Advanced Practice Providers on your designated Care Team: [place], [place] . Fabian Sharp, PA-C      Signed, Shelva Majestic, MD  10/29/2017 6:00 PM    [place] [address], [place], [place], [place]  [address] Phone: [phone]

## 2017-10-29 ENCOUNTER — Encounter: Payer: Self-pay | Admitting: Cardiovascular Disease

## 2017-11-26 ENCOUNTER — Other Ambulatory Visit: Payer: Self-pay | Admitting: Family Medicine

## 2017-12-01 ENCOUNTER — Other Ambulatory Visit (INDEPENDENT_AMBULATORY_CARE_PROVIDER_SITE_OTHER): Payer: BC Managed Care – PPO

## 2017-12-01 DIAGNOSIS — E785 Hyperlipidemia, unspecified: Secondary | ICD-10-CM | POA: Diagnosis not present

## 2017-12-01 LAB — COMPREHENSIVE METABOLIC PANEL
ALBUMIN: 3.9 g/dL (ref 3.5–5.2)
ALT: 22 U/L (ref 0–35)
AST: 18 U/L (ref 0–37)
Alkaline Phosphatase: 86 U/L (ref 39–117)
BILIRUBIN TOTAL: 0.3 mg/dL (ref 0.2–1.2)
BUN: 16 mg/dL (ref 6–23)
CALCIUM: 9.4 mg/dL (ref 8.4–10.5)
CO2: 27 meq/L (ref 19–32)
CREATININE: 0.83 mg/dL (ref 0.40–1.20)
Chloride: 105 mEq/L (ref 96–112)
GFR: 74.74 mL/min (ref >60.00)
Glucose, Bld: 101 mg/dL — ABNORMAL HIGH (ref 70–99)
Potassium: 4 mEq/L (ref 3.5–5.1)
Sodium: 140 mEq/L (ref 135–145)
Total Protein: 7.2 g/dL (ref 6.0–8.3)

## 2017-12-01 LAB — LIPID PANEL
CHOL/HDL RATIO: 3
Cholesterol: 122 mg/dL (ref 0–200)
HDL: 37.9 mg/dL — ABNORMAL LOW (ref >39.00)
LDL Cholesterol: 60 mg/dL (ref 0–99)
NONHDL: 84.1
TRIGLYCERIDES: 120 mg/dL (ref 0.0–149.0)
VLDL: 24 mg/dL (ref 0.0–40.0)

## 2017-12-06 ENCOUNTER — Other Ambulatory Visit: Payer: Self-pay | Admitting: Family Medicine

## 2017-12-07 ENCOUNTER — Ambulatory Visit (HOSPITAL_COMMUNITY)
Admission: RE | Admit: 2017-12-07 | Discharge: 2017-12-07 | Disposition: A | Discharge status: Home / Self Care | Payer: BC Managed Care – PPO | Source: Ambulatory Visit | Attending: Cardiovascular Disease | Admitting: Cardiovascular Disease

## 2017-12-07 ENCOUNTER — Ambulatory Visit (HOSPITAL_COMMUNITY): Admission: RE | Admit: 2017-12-07 | Payer: BC Managed Care – PPO | Source: Ambulatory Visit

## 2017-12-07 ENCOUNTER — Encounter: Payer: BC Managed Care – PPO | Admitting: *Deleted

## 2017-12-07 DIAGNOSIS — Z8249 Family history of ischemic heart disease and other diseases of the circulatory system: Secondary | ICD-10-CM

## 2017-12-07 DIAGNOSIS — R0609 Other forms of dyspnea: Secondary | ICD-10-CM | POA: Diagnosis present

## 2017-12-07 DIAGNOSIS — R0789 Other chest pain: Secondary | ICD-10-CM

## 2017-12-07 DIAGNOSIS — Z006 Encounter for examination for normal comparison and control in clinical research program: Secondary | ICD-10-CM

## 2017-12-07 DIAGNOSIS — R079 Chest pain, unspecified: Secondary | ICD-10-CM

## 2017-12-07 MED ORDER — NITROGLYCERIN 0.4 MG SL SUBL
0.8000 mg | SUBLINGUAL_TABLET | SUBLINGUAL | Status: DC | PRN
  Administered 2017-12-07: 0.8 mg via SUBLINGUAL

## 2017-12-07 MED ORDER — IOPAMIDOL (ISOVUE-370) INJECTION 76%
100.0000 mL | Freq: Once | INTRAVENOUS | Status: AC | PRN
  Administered 2017-12-07: 100 mL via INTRAVENOUS

## 2017-12-07 MED ORDER — NITROGLYCERIN 0.4 MG SL SUBL
SUBLINGUAL_TABLET | SUBLINGUAL | Status: AC
Start: 2017-12-07 — End: 2017-12-07
  Administered 2017-12-07: 0.8 mg via SUBLINGUAL
  Filled 2017-12-07: qty 2

## 2017-12-07 NOTE — Research (Signed)
Subject Name: Ariana [name]  Subject met inclusion and exclusion criteria.  The informed consent form, study requirements and expectations were reviewed with the subject and questions and concerns were addressed prior to the signing of the consent form.  The subject verbalized understanding of the trial requirements.  The subject agreed to participate in the CADFEM trial and signed the informed consent  on 12/07/2017.  The informed consent was obtained prior to performance of any protocol-specific procedures for the subject.  A copy of the signed informed consent was given to the subject and a copy was placed in the subject's medical record.   Star [place]

## 2017-12-15 ENCOUNTER — Other Ambulatory Visit: Payer: Self-pay | Admitting: Family Medicine

## 2017-12-15 NOTE — Telephone Encounter (Signed)
Last OV 07/22/17 Alprazolam last filled 08/23/17 #60 with 0

## 2017-12-22 ENCOUNTER — Encounter: Payer: Self-pay | Admitting: Physician Assistant

## 2017-12-22 ENCOUNTER — Ambulatory Visit: Payer: BC Managed Care – PPO | Admitting: Physician Assistant

## 2017-12-22 VITALS — BP 130/76 | HR 96 | Ht 63.0 in | Wt 271.4 lb

## 2017-12-22 DIAGNOSIS — I10 Essential (primary) hypertension: Secondary | ICD-10-CM

## 2017-12-22 DIAGNOSIS — E785 Hyperlipidemia, unspecified: Secondary | ICD-10-CM | POA: Diagnosis not present

## 2017-12-22 DIAGNOSIS — R0609 Other forms of dyspnea: Secondary | ICD-10-CM

## 2017-12-22 NOTE — Patient Instructions (Signed)
Medication Instructions:  Your Physician recommend you continue on your current medication as directed.     If you need a refill on your cardiac medications before your next appointment, please call your pharmacy.   Lab work: None  Testing/Procedures: None  Follow-Up: At [facility], you and your health needs are our priority.  As part of our continuing mission to provide you with exceptional heart care, we have created designated Provider Care Teams.  These Care Teams include your primary Cardiologist (physician) and Advanced Practice Providers (APPs -  Physician Assistants and Nurse Practitioners) who all work together to provide you with the care you need, when you need it. You will need a follow up appointment in 1 years.  Please call our office 2 months in advance to schedule this appointment.  You may see Dr. Claiborne Billings or one of the following Advanced Practice Providers on your designated Care Team: [place], [place] . Fabian Sharp, PA-C

## 2017-12-22 NOTE — Progress Notes (Signed)
Cardiology Office Note    Date:  12/24/2017   ID:  Ariana, [name] [birth year], MRN [id number]  PCP:  Midge Minium, MD  Cardiologist: Dr. Claiborne Billings  Chief Complaint  Patient presents with  . Follow-up    seen for Dr. Claiborne Billings.     History of Present Illness:  Ariana [name] is a 57 y.o. female with past medical history of hypertension and hyperlipidemia who was recently referred to Dr. Claiborne Billings for evaluation of exertional dyspnea.  He had echocardiogram in July 2019 that showed a normal EF, 60 to 65%, grade 2 DD.  D-dimer was negative.  Chest x-ray did not reveal any acute cardiopulmonary disease.  Pulmonary function study did not reveal significant evidence of obstruction, she does have mild small airway disease.  During the past office visit, her HCTZ and potassium was stopped and she was started on a course of spironolactone 12.5 mg daily.  She underwent a coronary CT on 12/07/2017 which showed minimal disease in proximal and distal RCA, minimal disease in the LAD, otherwise no significant disease.  Patient presents today for cardiology office visit.  She denies any recent chest pain.  She continued to have some shortness of breath with activity.  I have reviewed the recent coronary CT with her, she only have very mild disease in the RCA and LAD territory, no interventions needed.  She appears to be euvolemic on physical exam.  I will continue on the current therapy.  At this time I do not recommend additional cardiac work-up.  She can follow-up with Dr. Claiborne Billings in 1 year.   Past Medical History:  Diagnosis Date  . Anxiety   . Depression   . Hyperlipidemia   . Hypertension   . OA (osteoarthritis)    in Bil knees/ gets steroid injections for pain  . Ovarian cyst    right ovary/had ablation    Past Surgical History:  Procedure Laterality Date  . ENDOMETRIAL ABLATION  2012  . OVARIAN CYST REMOVAL Right 2012    Current Medications: Outpatient Medications Prior to Visit    Medication Sig Dispense Refill  . albuterol (PROVENTIL HFA;VENTOLIN HFA) 108 (90 Base) MCG/ACT inhaler Inhale 2 puffs into the lungs every 6 (six) hours as needed for wheezing or shortness of breath. 1 Inhaler 6  . ALPRAZolam (XANAX) 0.5 MG tablet TAKE 1 TABLET BY MOUTH TWICE A DAY AS NEEDED FOR ANXIETY 60 tablet 0  . amLODipine (NORVASC) 5 MG tablet TAKE 1 TABLET BY MOUTH EVERY DAY 90 tablet 1  . buPROPion (WELLBUTRIN XL) 300 MG 24 hr tablet TAKE 1 TABLET BY MOUTH EVERY DAY 90 tablet 1  . fluticasone furoate-vilanterol (BREO ELLIPTA) 100-25 MCG/INH AEPB Inhale 1 puff into the lungs daily. 1 each 5  . furosemide (LASIX) 20 MG tablet Take 1 tablet (20 mg total) by mouth daily as needed. 30 tablet 3  . ibuprofen (ADVIL,MOTRIN) 200 MG tablet Take 200 mg by mouth as needed.    Marland Kitchen omeprazole (PRILOSEC) 40 MG capsule TAKE 1 CAPSULE BY MOUTH EVERY DAY 90 capsule 1  . rosuvastatin (CRESTOR) 20 MG tablet Take 1 tablet (20 mg total) by mouth daily. 90 tablet 3  . spironolactone (ALDACTONE) 25 MG tablet Take 0.5 tablets (12.5 mg total) by mouth 2 (two) times daily. 90 tablet 3  . valACYclovir (VALTREX) 1000 MG tablet Take by mouth.    Marland Kitchen FLUoxetine (PROZAC) 40 MG capsule TAKE 1 CAPSULE BY MOUTH EVERY DAY (Patient not taking: Reported on  12/22/2017) 90 capsule 1  . metoprolol tartrate (LOPRESSOR) 100 MG tablet Take 100 mg (1 tablet) Two hours prior to CT (Patient not taking: Reported on 12/22/2017) 1 tablet 0   No facility-administered medications prior to visit.      Allergies:   Penicillins   Social History   Socioeconomic History  . Marital status: Legally Separated    Spouse name: Not on file  . Number of children: Not on file  . Years of education: Not on file  . Highest education level: Not on file  Occupational History  . Not on file  Social Needs  . Financial resource strain: Not on file  . Food insecurity:    Worry: Not on file    Inability: Not on file  . Transportation needs:     Medical: Not on file    Non-medical: Not on file  Tobacco Use  . Smoking status: Former Research scientist (life sciences)  . Smokeless tobacco: Never Used  . Tobacco comment: pt states she smoker for 3 years in high school (10/28/17)  Substance and Sexual Activity  . Alcohol use: No  . Drug use: No  . Sexual activity: Never  Lifestyle  . Physical activity:    Days per week: Not on file    Minutes per session: Not on file  . Stress: Not on file  Relationships  . Social connections:    Talks on phone: Not on file    Gets together: Not on file    Attends religious service: Not on file    Active member of club or organization: Not on file    Attends meetings of clubs or organizations: Not on file    Relationship status: Not on file  Other Topics Concern  . Not on file  Social History Narrative  . Not on file     Family History:  The patient's family history includes Arthritis in her brother and father; Dementia in her mother; Diabetes in her brother; Heart attack in her father; Heart disease in her brother and father; Hyperlipidemia in her father; Hypertension in her father; Liver disease in her brother.   ROS:   Please see the history of present illness.    ROS All other systems reviewed and are negative.   PHYSICAL EXAM:   VS:  BP 130/76   Pulse 96   Ht 5\' 3"  (1.6 m)   Wt 271 lb 6.4 oz (123.1 kg)   SpO2 97%   BMI 48.08 kg/m    GEN: Well nourished, well developed, in no acute distress  HEENT: normal  Neck: no JVD, carotid bruits, or masses Cardiac: RRR; no murmurs, rubs, or gallops,no edema  Respiratory:  clear to auscultation bilaterally, normal work of breathing GI: soft, nontender, nondistended, + BS MS: no deformity or atrophy  Skin: warm and dry, no rash Neuro:  Alert and Oriented x 3, Strength and sensation are intact Psych: euthymic mood, full affect  Wt Readings from Last 3 Encounters:  12/22/17 271 lb 6.4 oz (123.1 kg)  10/28/17 268 lb 6.4 oz (121.7 kg)  08/16/17 276 lb (125.2  kg)      Studies/Labs Reviewed:   EKG:  EKG is not ordered today.   Recent Labs: 07/22/2017: Hemoglobin 13.6; Platelets 279.0; TSH 2.03 07/30/2017: Pro B Natriuretic peptide (BNP) 41.0 12/01/2017: ALT 22; BUN 16; Creatinine, Ser 0.83; Potassium 4.0; Sodium 140   Lipid Panel    Component Value Date/Time   CHOL 122 12/01/2017 0917   TRIG 120.0 12/01/2017 0917  HDL 37.90 (L) 12/01/2017 0917   CHOLHDL 3 12/01/2017 0917   VLDL 24.0 12/01/2017 0917   LDLCALC 60 12/01/2017 0917   LDLDIRECT 99.0 08/29/2015 1417    Additional studies/ records that were reviewed today include:   Echo 08/04/2017 LV EF: 60% -   65% Study Conclusions  - Left ventricle: The cavity size was normal. Wall thickness was   normal. Systolic function was normal. The estimated ejection   fraction was in the range of 60% to 65%. Wall motion was normal;   there were no regional wall motion abnormalities. Features are   consistent with a pseudonormal left ventricular filling pattern,   with concomitant abnormal relaxation and increased filling   pressure (grade 2 diastolic dysfunction).    Coronary CT 12/07/2017 Aorta: Normal size. Ascending aorta 2.7cm. Mild calcification of the aortic root. No dissection.  Aortic Valve:  Trileaflet.  No calcifications.  Coronary Arteries:  Normal coronary origin.  Right dominance.  RCA is a large dominant artery that gives rise to PDA and PLVB. There is minimal (1-24%) plaque in the proximal and distal RCA.  Left main is a large artery that gives rise to LAD and LCX arteries.  LAD is a large vessel that has minimal (1-24%) soft attenuation plaque proximally. There is a large branching D1 that has no plaque.  LCX is a non-dominant artery that gives rise to one large OM1 branch. There is no plaque.  Other findings:  Normal pulmonary vein drainage into the left atrium.  Normal let atrial appendage without a thrombus.  Normal size of the pulmonary  artery.  Likely small PFO.  IMPRESSION: 1. Coronary calcium score of 3.89 This was 69th percentile for age and sex matched control.  2. Normal coronary origin with right dominance.  3. No evidence of obstructive CAD.    ASSESSMENT:    1. DOE (dyspnea on exertion)   2. Essential hypertension   3. Hyperlipidemia LDL goal <100      PLAN:  In order of problems listed above:  1. Dyspnea on exertion: Recent coronary CT is reviewed is reassuring, and minimal disease was noted.  I recommend for the patient to increase activity level  2. Hypertension: Blood pressure well controlled  3. Hyperlipidemia: on Crestor 20 mg daily, recent lipid panel showed very well-controlled cholesterol, low HDL.    Medication Adjustments/Labs and Tests Ordered: Current medicines are reviewed at length with the patient today.  Concerns regarding medicines are outlined above.  Medication changes, Labs and Tests ordered today are listed in the Patient Instructions below. Patient Instructions  Medication Instructions:  Your Physician recommend you continue on your current medication as directed.     If you need a refill on your cardiac medications before your next appointment, please call your pharmacy.   Lab work: None  Testing/Procedures: None  Follow-Up: At [facility], you and your health needs are our priority.  As part of our continuing mission to provide you with exceptional heart care, we have created designated Provider Care Teams.  These Care Teams include your primary Cardiologist (physician) and Advanced Practice Providers (APPs -  Physician Assistants and Nurse Practitioners) who all work together to provide you with the care you need, when you need it. You will need a follow up appointment in 1 years.  Please call our office 2 months in advance to schedule this appointment.  You may see Dr. Claiborne Billings or one of the following Advanced Practice Providers on your designated Care  Team:  Almyra Deforest, PA-C . Fabian Sharp, PA-C        Signed, [place], [place]  12/24/2017 11:32 PM    [facility] [address], [place], [place]  [address] Phone: [phone]; Fax: [phone]

## 2017-12-24 ENCOUNTER — Encounter: Payer: Self-pay | Admitting: Physician Assistant

## 2018-01-13 ENCOUNTER — Ambulatory Visit: Payer: BC Managed Care – PPO | Admitting: Nurse Practitioner

## 2018-01-13 ENCOUNTER — Encounter: Payer: Self-pay | Admitting: Nurse Practitioner

## 2018-01-13 ENCOUNTER — Ambulatory Visit (INDEPENDENT_AMBULATORY_CARE_PROVIDER_SITE_OTHER)
Admission: RE | Admit: 2018-01-13 | Discharge: 2018-01-13 | Disposition: A | Discharge status: Home / Self Care | Payer: BC Managed Care – PPO | Source: Ambulatory Visit | Attending: Nurse Practitioner | Admitting: Nurse Practitioner

## 2018-01-13 VITALS — BP 130/72 | HR 88 | Ht 63.0 in | Wt 277.4 lb

## 2018-01-13 DIAGNOSIS — R0609 Other forms of dyspnea: Secondary | ICD-10-CM

## 2018-01-13 DIAGNOSIS — R0602 Shortness of breath: Secondary | ICD-10-CM

## 2018-01-13 MED ORDER — BUDESONIDE-FORMOTEROL FUMARATE 160-4.5 MCG/ACT IN AERO: 2.0000 | INHALATION_SPRAY | Freq: Two times a day (BID) | RESPIRATORY_TRACT | 0 refills | Status: DC

## 2018-01-13 NOTE — Patient Instructions (Addendum)
Will trial Symbicort 160/4.5 Will refer to healthy weight and wellness for exercise program and weight loss Continue Proventil as needed Will order Stat chest x ray and call with results Patient walked in office today sats remained at 96% Follow up with Dr. Elsworth Soho at his first available appointment or sooner if needed

## 2018-01-13 NOTE — Progress Notes (Signed)
@Patient  ID: Ariana [name], female    DOB: 1960/09/13, 58 y.o.   MRN: [id number]  Chief Complaint  Patient presents with  . Shortness of Breath    Referring provider: Midge Minium, MD  HPI  58 year old former smoker with allergic rhinitis and obesity followed by Dr. Elsworth Soho PMH: Pretension, hyperlipidemia, osteoarthritis.  Tests:  07/30/17 CXR - No active cardiopulmonary disease. D-dimer negative BNP - 41  PFT:  PFT Results Latest Ref Rng & Units 08/16/2017  FVC-Pre L 2.57  FVC-Predicted Pre % 83  FVC-Post L 2.58  FVC-Predicted Post % 83  Pre FEV1/FVC % % 74  Post FEV1/FCV % % 75  FEV1-Pre L 1.90  FEV1-Predicted Pre % 79  FEV1-Post L 1.94   OV 01/14/18 - Shortness of breath Patient presents today with ongoing shortness of breath with exertion.  She was last seen by [facility] on 08/16/2017.  Was trialed on Breo for mild small airway disease.  She states that this has not helped.  States that she had a work-up by cardiology and everything was negative.  Reports shortness of breath while while at work when she is walking up and down the halls.  He has to stop and rest frequently.  He denies any recent fever, chest pain, or edema.  He denies any wheezing.  Recent echo showed EF 60 to 65%, LV with normal systolic function, grade 2 diastolic dysfunction.  She was walked in office today and her sats did not drop below 96% on room air.  Allergies  Allergen Reactions  . Penicillins Hives    Immunization History  Administered Date(s) Administered  . Tdap 08/29/2015    Past Medical History:  Diagnosis Date  . Anxiety   . Depression   . Hyperlipidemia   . Hypertension   . OA (osteoarthritis)    in Bil knees/ gets steroid injections for pain  . Ovarian cyst    right ovary/had ablation    Tobacco History: Social History   Tobacco Use  Smoking Status Former Smoker  Smokeless Tobacco Never Used  Tobacco Comment   pt states she smoker for 3 years in high school  (10/28/17)   Counseling given: Not Answered Comment: pt states she smoker for 3 years in high school (10/28/17)   Outpatient Encounter Medications as of 01/13/2018  Medication Sig  . albuterol (PROVENTIL HFA;VENTOLIN HFA) 108 (90 Base) MCG/ACT inhaler Inhale 2 puffs into the lungs every 6 (six) hours as needed for wheezing or shortness of breath.  . ALPRAZolam (XANAX) 0.5 MG tablet TAKE 1 TABLET BY MOUTH TWICE A DAY AS NEEDED FOR ANXIETY  . amLODipine (NORVASC) 5 MG tablet TAKE 1 TABLET BY MOUTH EVERY DAY  . buPROPion (WELLBUTRIN XL) 300 MG 24 hr tablet TAKE 1 TABLET BY MOUTH EVERY DAY  . fluticasone furoate-vilanterol (BREO ELLIPTA) 100-25 MCG/INH AEPB Inhale 1 puff into the lungs daily.  . furosemide (LASIX) 20 MG tablet Take 1 tablet (20 mg total) by mouth daily as needed.  Marland Kitchen ibuprofen (ADVIL,MOTRIN) 200 MG tablet Take 200 mg by mouth as needed.  Marland Kitchen omeprazole (PRILOSEC) 40 MG capsule TAKE 1 CAPSULE BY MOUTH EVERY DAY  . rosuvastatin (CRESTOR) 20 MG tablet Take 1 tablet (20 mg total) by mouth daily.  Marland Kitchen spironolactone (ALDACTONE) 25 MG tablet Take 0.5 tablets (12.5 mg total) by mouth 2 (two) times daily.  . budesonide-formoterol (SYMBICORT) 160-4.5 MCG/ACT inhaler Inhale 2 puffs into the lungs 2 (two) times daily.   No facility-administered encounter medications on  file as of 01/13/2018.      Review of Systems  Review of Systems  Constitutional: Negative.  Negative for chills and fever.  HENT: Negative.   Respiratory: Positive for shortness of breath. Negative for cough.   Cardiovascular: Negative.  Negative for chest pain, palpitations and leg swelling.  Gastrointestinal: Negative.   Allergic/Immunologic: Negative.   Neurological: Negative.   Psychiatric/Behavioral: Negative.        Physical Exam  BP 130/72 (BP Location: Left Arm, Patient Position: Sitting, Cuff Size: Large)   Pulse 88   Ht 5\' 3"  (1.6 m)   Wt 277 lb 6.4 oz (125.8 kg)   SpO2 97%   BMI 49.14 kg/m   Wt  Readings from Last 5 Encounters:  01/13/18 277 lb 6.4 oz (125.8 kg)  12/22/17 271 lb 6.4 oz (123.1 kg)  10/28/17 268 lb 6.4 oz (121.7 kg)  08/16/17 276 lb (125.2 kg)  07/30/17 275 lb 12.8 oz (125.1 kg)     Physical Exam Vitals signs and nursing note reviewed.  Constitutional:      General: She is not in acute distress.    Appearance: She is well-developed.  Cardiovascular:     Rate and Rhythm: Normal rate and regular rhythm.  Pulmonary:     Effort: Pulmonary effort is normal.     Breath sounds: Normal breath sounds. No decreased breath sounds, wheezing or rhonchi.  Neurological:     Mental Status: She is alert and oriented to person, place, and time.       Assessment & Plan:   DOE (dyspnea on exertion) Will trial Symbicort.  Most likely shortness of breath is due to deconditioning and weight.  Will refer to healthy weight loss and wellness for exercise program and weight loss.  Patient Instructions  Will trial Symbicort 160/4.5 Will refer to healthy weight and wellness for exercise program and weight loss Continue Proventil as needed Will order Stat chest x ray and call with results Patient walked in office today sats remained at 96% Follow up with Dr. Elsworth Soho at his first available appointment or sooner if needed    Severe obesity (BMI >= 40) ([place]) As of breath is most likely due to deconditioning and obesity.  Refer for weight management.  Patient Instructions  Will trial Symbicort 160/4.5 Will refer to healthy weight and wellness for exercise program and weight loss Continue Proventil as needed Will order Stat chest x ray and call with results Patient walked in office today sats remained at 96% Follow up with Dr. Elsworth Soho at his first available appointment or sooner if needed       Fenton Foy, NP 01/14/2018

## 2018-01-14 ENCOUNTER — Encounter: Payer: Self-pay | Admitting: Nurse Practitioner

## 2018-01-14 NOTE — Assessment & Plan Note (Signed)
Will trial Symbicort.  Most likely shortness of breath is due to deconditioning and weight.  Will refer to healthy weight loss and wellness for exercise program and weight loss.  Patient Instructions  Will trial Symbicort 160/4.5 Will refer to healthy weight and wellness for exercise program and weight loss Continue Proventil as needed Will order Stat chest x ray and call with results Patient walked in office today sats remained at 96% Follow up with Dr. Elsworth Soho at his first available appointment or sooner if needed

## 2018-01-14 NOTE — Assessment & Plan Note (Signed)
As of breath is most likely due to deconditioning and obesity.  Refer for weight management.  Patient Instructions  Will trial Symbicort 160/4.5 Will refer to healthy weight and wellness for exercise program and weight loss Continue Proventil as needed Will order Stat chest x ray and call with results Patient walked in office today sats remained at 96% Follow up with Dr. Elsworth Soho at his first available appointment or sooner if needed

## 2018-01-19 ENCOUNTER — Encounter: Payer: Self-pay | Admitting: Nurse Practitioner

## 2018-01-19 NOTE — Progress Notes (Signed)
LMTCB and letter sent 

## 2018-01-21 ENCOUNTER — Ambulatory Visit: Payer: BC Managed Care – PPO | Admitting: Pulmonary Disease

## 2018-01-21 ENCOUNTER — Encounter: Payer: Self-pay | Admitting: Pulmonary Disease

## 2018-01-21 DIAGNOSIS — R0609 Other forms of dyspnea: Secondary | ICD-10-CM

## 2018-01-21 MED ORDER — BUDESONIDE-FORMOTEROL FUMARATE 160-4.5 MCG/ACT IN AERO: 2.0000 | INHALATION_SPRAY | Freq: Two times a day (BID) | RESPIRATORY_TRACT | 0 refills | Status: DC

## 2018-01-21 MED ORDER — BUDESONIDE-FORMOTEROL FUMARATE 160-4.5 MCG/ACT IN AERO: 2.0000 | INHALATION_SPRAY | Freq: Two times a day (BID) | RESPIRATORY_TRACT | 6 refills | Status: DC

## 2018-01-21 NOTE — Assessment & Plan Note (Signed)
Not convinced that this is exercise-induced asthma, seems to be adult onset for about a year.  However she is symptomatically improved with Symbicort.  Feel that diastolic dysfunction is more likely cause as well as obesity and deconditioning  Okay to stay on Symbicort -we will reassess in a year. Weight loss recommended.  I also encouraged her to get onto a conditioning program

## 2018-01-21 NOTE — Addendum Note (Signed)
Addended by: Valerie Salts on: 01/21/2018 11:12 AM   Modules accepted: Orders

## 2018-01-21 NOTE — Patient Instructions (Signed)
Okay to stay on Symbicort -we will reassess in a year. Weight loss recommended

## 2018-01-21 NOTE — Addendum Note (Signed)
Addended by: Valerie Salts on: 01/21/2018 12:38 PM   Modules accepted: Orders

## 2018-01-21 NOTE — Progress Notes (Signed)
   Subjective:    Patient ID: Ariana [name], female    DOB: 1960/06/03, 58 y.o.   MRN: [id number]  HPI  58 year old obese remote smoker for follow-up of dyspnea on exertion x 1 year She reports intermittent wheezing on exertion. Cardiac evaluation showed diastolic dysfunction.  She has mild pedal edema while on amlodipine for which she uses Lasix and Aldactone was added by cardiology. CT chest was negative for obstructive CAD on my review does not show any lung pathology.  She was started empirically on Symbicort reports symptomatic improvement with this, she is not wheezing as much on exertion now.   Significant tests/ events reviewed  PFT 08/2017  no evidence of obstruction - ratio 74, FEV1 79%, FVC 83% Mild small airway disease.   Echocardiogram showed EF 60-65%. LV with normal systolic function. Grade 2 diastolic dysfunction.   Review of Systems neg for any significant sore throat, dysphagia, itching, sneezing, nasal congestion or excess/ purulent secretions, fever, chills, sweats, unintended wt loss, pleuritic or exertional cp, hempoptysis, orthopnea pnd or change in chronic leg swelling. Also denies presyncope, palpitations, heartburn, abdominal pain, nausea, vomiting, diarrhea or change in bowel or urinary habits, dysuria,hematuria, rash, arthralgias, visual complaints, headache, numbness weakness or ataxia.     Objective:   Physical Exam   Gen. Pleasant, obese, in no distress ENT - no lesions, no post nasal drip Neck: No JVD, no thyromegaly, no carotid bruits Lungs: no use of accessory muscles, no dullness to percussion, decreased without rales or rhonchi  Cardiovascular: Rhythm regular, heart sounds  normal, no murmurs or gallops, no peripheral edema Musculoskeletal: No deformities, no cyanosis or clubbing , no tremors        Assessment & Plan:

## 2018-02-09 ENCOUNTER — Other Ambulatory Visit: Payer: Self-pay | Admitting: Family Medicine

## 2018-02-09 NOTE — Telephone Encounter (Signed)
Last OV 07/22/17 Alprazolam last filled 12/15/17 #60 with 0

## 2018-02-16 ENCOUNTER — Ambulatory Visit: Payer: BC Managed Care – PPO | Admitting: Pulmonary Disease

## 2018-05-03 ENCOUNTER — Other Ambulatory Visit: Payer: Self-pay | Admitting: Family Medicine

## 2018-05-03 NOTE — Telephone Encounter (Signed)
Last refill:02/10/18 #30, 0 Last OV:07/22/17

## 2018-05-05 ENCOUNTER — Ambulatory Visit (INDEPENDENT_AMBULATORY_CARE_PROVIDER_SITE_OTHER): Payer: BC Managed Care – PPO | Admitting: Family Medicine

## 2018-05-05 ENCOUNTER — Encounter: Payer: Self-pay | Admitting: Family Medicine

## 2018-05-05 ENCOUNTER — Other Ambulatory Visit: Payer: Self-pay

## 2018-05-05 VITALS — Wt 269.0 lb

## 2018-05-05 DIAGNOSIS — E559 Vitamin D deficiency, unspecified: Secondary | ICD-10-CM | POA: Diagnosis not present

## 2018-05-05 DIAGNOSIS — Z0000 Encounter for general adult medical examination without abnormal findings: Secondary | ICD-10-CM

## 2018-05-05 NOTE — Progress Notes (Signed)
Virtual Visit via Video   I connected with patient on 05/05/18 at 10:00 AM EDT by a video enabled telemedicine application and verified that I am speaking with the correct person using two identifiers.  Location patient: Home Location provider: [facility]up[facility], Office Persons participating in the virtual visit: Patient, Provider, [place] (Jess B)  I discussed the limitations of evaluation and management by telemedicine and the availability of in person appointments. The patient expressed understanding and agreed to proceed.  Subjective:   HPI:  CPE- Pt is due for mammo this summer.  UTD on pap and colonoscopy.  UTD on immunizations.    ROS:  Patient reports no vision/ hearing changes, adenopathy,fever, weight change,  persistant/recurrent hoarseness , swallowing issues, chest pain, palpitations, edema, persistant/recurrent cough, hemoptysis, gastrointestinal bleeding (melena, rectal bleeding), abdominal pain, significant heartburn, bowel changes, GU symptoms (dysuria, hematuria, incontinence), Gyn symptoms (abnormal  bleeding, pain),  syncope, focal weakness, memory loss, numbness & tingling, skin/hair/nail changes, abnormal bruising or bleeding, anxiety, or depression.  + ongoing SOB   Patient Active Problem List   Diagnosis Date Noted  . DOE (dyspnea on exertion) 07/30/2017  . GERD (gastroesophageal reflux disease) 07/22/2017  . Vitamin D deficiency 03/22/2017  . Degenerative arthritis of knee, bilateral 03/22/2017  . Peripheral edema 07/28/2016  . Urticaria 07/28/2016  . Allergic rhinitis 10/31/2015  . Severe obesity (BMI >= 40) ([place]) 08/29/2015  . Anxiety and depression 08/29/2015  . HTN (hypertension) 08/29/2015  . Hyperlipidemia 08/29/2015    Social History   Tobacco Use  . Smoking status: Former Research scientist (life sciences)  . Smokeless tobacco: Never Used  . Tobacco comment: pt states she smoker for 3 years in high school (10/28/17)  Substance Use Topics  . Alcohol use: No     Current Outpatient Medications:  .  albuterol (PROVENTIL HFA;VENTOLIN HFA) 108 (90 Base) MCG/ACT inhaler, Inhale 2 puffs into the lungs every 6 (six) hours as needed for wheezing or shortness of breath., Disp: 1 Inhaler, Rfl: 6 .  ALPRAZolam (XANAX) 1 MG tablet, TAKE 1/2 TABLET BY MOUTH TWICE A DAY AS NEEDED FOR ANXIETY, Disp: 30 tablet, Rfl: 0 .  amLODipine (NORVASC) 5 MG tablet, TAKE 1 TABLET BY MOUTH EVERY DAY, Disp: 90 tablet, Rfl: 1 .  budesonide-formoterol (SYMBICORT) 160-4.5 MCG/ACT inhaler, Inhale 2 puffs into the lungs 2 (two) times daily., Disp: 1 Inhaler, Rfl: 6 .  buPROPion (WELLBUTRIN XL) 300 MG 24 hr tablet, TAKE 1 TABLET BY MOUTH EVERY DAY, Disp: 90 tablet, Rfl: 1 .  furosemide (LASIX) 20 MG tablet, Take 1 tablet (20 mg total) by mouth daily as needed., Disp: 30 tablet, Rfl: 3 .  ibuprofen (ADVIL,MOTRIN) 200 MG tablet, Take 200 mg by mouth as needed., Disp: , Rfl:  .  omeprazole (PRILOSEC) 40 MG capsule, TAKE 1 CAPSULE BY MOUTH EVERY DAY, Disp: 90 capsule, Rfl: 1 .  spironolactone (ALDACTONE) 25 MG tablet, Take 0.5 tablets (12.5 mg total) by mouth 2 (two) times daily., Disp: 90 tablet, Rfl: 3 .  rosuvastatin (CRESTOR) 20 MG tablet, Take 1 tablet (20 mg total) by mouth daily., Disp: 90 tablet, Rfl: 3  Allergies  Allergen Reactions  . Penicillins Hives    Objective:   There were no vitals taken for this visit. AAOx3, NAD NCAT, EOMI Obese No obvious CN deficits Coloring WNL Pt is able to speak clearly, coherently without shortness of breath or increased work of breathing.  Thought process is linear.  Mood is appropriate.   Assessment and Plan:   CPE-  pt is planning to schedule GYN appt for pap and mammo.  UTD on colonoscopy and immunizations.  Check labs.  Anticipatory guidance provided.   Obesity- pt is down 6 lbs since last visit.  Encouraged her to continue to work on healthy diet and increase her physical activity to improve her overall health and wellbeing.  Check  labs to risk stratify.  Will continue to follow.  Vit D Deficiency- check labs and replete prn.   Annye Asa, MD 05/05/2018

## 2018-05-05 NOTE — Progress Notes (Signed)
I have discussed the procedure for the virtual visit with the patient who has given consent to proceed with assessment and treatment.   Jessica L Brodmerkel, CMA     

## 2018-05-06 ENCOUNTER — Other Ambulatory Visit (INDEPENDENT_AMBULATORY_CARE_PROVIDER_SITE_OTHER): Payer: BC Managed Care – PPO

## 2018-05-06 DIAGNOSIS — E559 Vitamin D deficiency, unspecified: Secondary | ICD-10-CM | POA: Diagnosis not present

## 2018-05-06 LAB — BASIC METABOLIC PANEL
BUN: 15 mg/dL (ref 6–23)
CO2: 30 mEq/L (ref 19–32)
Calcium: 9.4 mg/dL (ref 8.4–10.5)
Chloride: 101 mEq/L (ref 96–112)
Creatinine, Ser: 0.89 mg/dL (ref 0.40–1.20)
GFR: 64.78 mL/min (ref >60.00)
Glucose, Bld: 112 mg/dL — ABNORMAL HIGH (ref 70–99)
Potassium: 4.5 mEq/L (ref 3.5–5.1)
Sodium: 140 mEq/L (ref 135–145)

## 2018-05-06 LAB — CBC WITH DIFFERENTIAL/PLATELET
Basophils Absolute: 0.1 10*3/uL (ref 0.0–0.1)
Basophils Relative: 0.7 % (ref 0.0–3.0)
Eosinophils Absolute: 0 10*3/uL (ref 0.0–0.7)
Eosinophils Relative: 0.2 % (ref 0.0–5.0)
HCT: 40.2 % (ref 36.0–46.0)
Hemoglobin: 12.9 g/dL (ref 12.0–15.0)
Lymphocytes Relative: 19.2 % (ref 12.0–46.0)
Lymphs Abs: 1.7 10*3/uL (ref 0.7–4.0)
MCHC: 32.1 g/dL (ref 30.0–36.0)
MCV: 77.3 fl — ABNORMAL LOW (ref 78.0–100.0)
Monocytes Absolute: 0.6 10*3/uL (ref 0.1–1.0)
Monocytes Relative: 6.5 % (ref 3.0–12.0)
Neutro Abs: 6.3 10*3/uL (ref 1.4–7.7)
Neutrophils Relative %: 73.4 % (ref 43.0–77.0)
Platelets: 279 10*3/uL (ref 150.0–400.0)
RBC: 5.2 Mil/uL — ABNORMAL HIGH (ref 3.87–5.11)
RDW: 16.6 % — ABNORMAL HIGH (ref 11.5–15.5)
WBC: 8.7 10*3/uL (ref 4.0–10.5)

## 2018-05-06 LAB — HEMOGLOBIN A1C: Hgb A1c MFr Bld: 7.3 % — ABNORMAL HIGH (ref 4.6–6.5)

## 2018-05-06 LAB — TSH: TSH: 2.59 u[IU]/mL (ref 0.35–4.50)

## 2018-05-06 LAB — HEPATIC FUNCTION PANEL
ALT: 23 U/L (ref 0–35)
AST: 24 U/L (ref 0–37)
Albumin: 4 g/dL (ref 3.5–5.2)
Alkaline Phosphatase: 103 U/L (ref 39–117)
Bilirubin, Direct: 0.1 mg/dL (ref 0.0–0.3)
Total Bilirubin: 0.4 mg/dL (ref 0.2–1.2)
Total Protein: 7 g/dL (ref 6.0–8.3)

## 2018-05-06 LAB — LIPID PANEL
Cholesterol: 156 mg/dL (ref 0–200)
HDL: 39.9 mg/dL (ref >39.00)
LDL Cholesterol: 84 mg/dL (ref 0–99)
NonHDL: 116.42
Total CHOL/HDL Ratio: 4
Triglycerides: 162 mg/dL — ABNORMAL HIGH (ref 0.0–149.0)
VLDL: 32.4 mg/dL (ref 0.0–40.0)

## 2018-05-06 LAB — VITAMIN D 25 HYDROXY (VIT D DEFICIENCY, FRACTURES): VITD: 27.01 ng/mL — ABNORMAL LOW (ref 30.00–100.00)

## 2018-05-11 ENCOUNTER — Other Ambulatory Visit: Payer: Self-pay | Admitting: General Practice

## 2018-05-11 MED ORDER — VITAMIN D (ERGOCALCIFEROL) 1.25 MG (50000 UNIT) PO CAPS: 50000.0000 [IU] | ORAL_CAPSULE | ORAL | 0 refills | Status: DC

## 2018-05-12 ENCOUNTER — Encounter: Payer: Self-pay | Admitting: Family Medicine

## 2018-05-12 ENCOUNTER — Ambulatory Visit (INDEPENDENT_AMBULATORY_CARE_PROVIDER_SITE_OTHER): Payer: BC Managed Care – PPO | Admitting: Family Medicine

## 2018-05-12 DIAGNOSIS — E119 Type 2 diabetes mellitus without complications: Secondary | ICD-10-CM | POA: Diagnosis not present

## 2018-05-12 MED ORDER — EMPAGLIFLOZIN 10 MG PO TABS: 10.0000 mg | ORAL_TABLET | Freq: Every day | ORAL | 3 refills | Status: DC

## 2018-05-12 NOTE — Progress Notes (Signed)
I have discussed the procedure for the virtual visit with the patient who has given consent to proceed with assessment and treatment.   Jessica L Brodmerkel, CMA     

## 2018-05-12 NOTE — Progress Notes (Signed)
Virtual Visit via Video   I connected with patient on 05/12/18 at 10:00 AM EDT by a video enabled telemedicine application and verified that I am speaking with the correct person using two identifiers.  Location patient: Home Location provider: [facility], Office Persons participating in the virtual visit: Patient, Provider, [place] (Jess B)  I discussed the limitations of evaluation and management by telemedicine and the availability of in person appointments. The patient expressed understanding and agreed to proceed.  Subjective:   HPI:   DM- new dx for pt.  A1C on 5/1 was 7.3  + family hx.  Brother was on Metformin for over 10 yrs and kidney fxn continued to decline.  This scares her and makes her very leery of Metformin use.  Has never had an eye exam.  Pt is not currently following a particular diet.  ROS:   See pertinent positives and negatives per HPI.  Patient Active Problem List   Diagnosis Date Noted  . DOE (dyspnea on exertion) 07/30/2017  . GERD (gastroesophageal reflux disease) 07/22/2017  . Vitamin D deficiency 03/22/2017  . Degenerative arthritis of knee, bilateral 03/22/2017  . Peripheral edema 07/28/2016  . Urticaria 07/28/2016  . Physical exam 03/05/2016  . Allergic rhinitis 10/31/2015  . Severe obesity (BMI >= 40) ([place]) 08/29/2015  . Anxiety and depression 08/29/2015  . HTN (hypertension) 08/29/2015  . Hyperlipidemia 08/29/2015    Social History   Tobacco Use  . Smoking status: Former Research scientist (life sciences)  . Smokeless tobacco: Never Used  . Tobacco comment: pt states she smoker for 3 years in high school (10/28/17)  Substance Use Topics  . Alcohol use: No    Current Outpatient Medications:  .  albuterol (PROVENTIL HFA;VENTOLIN HFA) 108 (90 Base) MCG/ACT inhaler, Inhale 2 puffs into the lungs every 6 (six) hours as needed for wheezing or shortness of breath., Disp: 1 Inhaler, Rfl: 6 .  ALPRAZolam (XANAX) 1 MG tablet, TAKE 1/2 TABLET BY MOUTH TWICE A DAY AS  NEEDED FOR ANXIETY, Disp: 30 tablet, Rfl: 0 .  amLODipine (NORVASC) 5 MG tablet, TAKE 1 TABLET BY MOUTH EVERY DAY, Disp: 90 tablet, Rfl: 1 .  budesonide-formoterol (SYMBICORT) 160-4.5 MCG/ACT inhaler, Inhale 2 puffs into the lungs 2 (two) times daily., Disp: 1 Inhaler, Rfl: 6 .  buPROPion (WELLBUTRIN XL) 300 MG 24 hr tablet, TAKE 1 TABLET BY MOUTH EVERY DAY, Disp: 90 tablet, Rfl: 1 .  furosemide (LASIX) 20 MG tablet, Take 1 tablet (20 mg total) by mouth daily as needed., Disp: 30 tablet, Rfl: 3 .  ibuprofen (ADVIL,MOTRIN) 200 MG tablet, Take 200 mg by mouth as needed., Disp: , Rfl:  .  omeprazole (PRILOSEC) 40 MG capsule, TAKE 1 CAPSULE BY MOUTH EVERY DAY, Disp: 90 capsule, Rfl: 1 .  spironolactone (ALDACTONE) 25 MG tablet, Take 0.5 tablets (12.5 mg total) by mouth 2 (two) times daily., Disp: 90 tablet, Rfl: 3 .  Vitamin D, Ergocalciferol, (DRISDOL) 1.25 MG (50000 UT) CAPS capsule, Take 1 capsule (50,000 Units total) by mouth every 7 (seven) days., Disp: 12 capsule, Rfl: 0 .  rosuvastatin (CRESTOR) 20 MG tablet, Take 1 tablet (20 mg total) by mouth daily., Disp: 90 tablet, Rfl: 3  Allergies  Allergen Reactions  . Penicillins Hives    Objective:   There were no vitals taken for this visit.  AAOx3, NAD NCAT, EOMI Obese No obvious CN deficits Coloring WNL Pt is able to speak clearly, coherently without shortness of breath or increased work of breathing.  Thought  process is linear.  Mood is appropriate.   Assessment and Plan:   DM- new dx for pt.  Discussed at length the diagnosis, possible treatment options, the need for more frequent visits and routine monitoring of sugars, renal function, cholesterol.  Discussed need for yearly eye exam- referral placed.  Given her uneasiness about Metformin will start Jardiance instead.  Reviewed need for low carb diet and what constitutes a carb.  Encouraged regular exercise.  Will continue to follow closely and adjust tx plan prn.   Annye Asa, MD 05/12/2018

## 2018-06-01 ENCOUNTER — Other Ambulatory Visit: Payer: Self-pay | Admitting: Family Medicine

## 2018-06-07 LAB — HM DIABETES EYE EXAM

## 2018-06-16 ENCOUNTER — Other Ambulatory Visit: Payer: Self-pay | Admitting: Family Medicine

## 2018-06-29 ENCOUNTER — Other Ambulatory Visit: Payer: Self-pay | Admitting: Family Medicine

## 2018-06-29 DIAGNOSIS — R609 Edema, unspecified: Secondary | ICD-10-CM

## 2018-07-01 ENCOUNTER — Encounter: Payer: Self-pay | Admitting: General Practice

## 2018-07-27 ENCOUNTER — Other Ambulatory Visit: Payer: Self-pay | Admitting: Family Medicine

## 2018-08-01 ENCOUNTER — Other Ambulatory Visit: Payer: Self-pay | Admitting: Family Medicine

## 2018-08-01 NOTE — Telephone Encounter (Signed)
Last OV 05/05/18 Alprazolam last filled 05/03/18 #30 with 0

## 2018-09-05 ENCOUNTER — Other Ambulatory Visit: Payer: Self-pay | Admitting: Family Medicine

## 2018-09-21 ENCOUNTER — Other Ambulatory Visit: Payer: Self-pay | Admitting: Family Medicine

## 2018-09-21 DIAGNOSIS — R609 Edema, unspecified: Secondary | ICD-10-CM

## 2018-09-22 ENCOUNTER — Telehealth: Payer: Self-pay

## 2018-09-22 MED ORDER — JARDIANCE 10 MG PO TABS: 10.0000 mg | ORAL_TABLET | Freq: Every day | ORAL | 6 refills | Status: DC

## 2018-09-22 NOTE — Addendum Note (Signed)
Addended by: Davis Gourd on: 09/22/2018 02:52 PM   Modules accepted: Orders

## 2018-09-22 NOTE — Telephone Encounter (Signed)
It is ok to include the DM in her appt. I filled the jardiance for her today and informed pt.

## 2018-09-22 NOTE — Telephone Encounter (Signed)
Patient called in stating that during her last VV with T[facility] that she was told someone would call her for a follow up appt since she has recently been diagnosed with DM type 2. Wanting to know if she needs to schedule an appt or OK to add DM f/u to appt already scheduled on 11/07/18 for BP & cholesterol f/u? Patient states that she is currently taking Jardiance and will run out before her November appt.   Please advise

## 2018-10-05 ENCOUNTER — Other Ambulatory Visit: Payer: Self-pay | Admitting: Primary Care

## 2018-10-23 ENCOUNTER — Other Ambulatory Visit: Payer: Self-pay | Admitting: Cardiovascular Disease

## 2018-10-23 ENCOUNTER — Other Ambulatory Visit: Payer: Self-pay | Admitting: Family Medicine

## 2018-10-24 NOTE — Telephone Encounter (Signed)
Last OV 05/05/18 Alprazolam last filled 08/01/18 #30 with 0

## 2018-11-07 ENCOUNTER — Ambulatory Visit: Payer: BC Managed Care – PPO | Admitting: Family Medicine

## 2018-11-07 ENCOUNTER — Encounter: Payer: Self-pay | Admitting: Family Medicine

## 2018-11-07 ENCOUNTER — Other Ambulatory Visit: Payer: Self-pay

## 2018-11-07 VITALS — BP 126/86 | HR 72 | Temp 97.9°F | Resp 16 | Ht 63.0 in | Wt 268.4 lb

## 2018-11-07 DIAGNOSIS — E119 Type 2 diabetes mellitus without complications: Secondary | ICD-10-CM

## 2018-11-07 DIAGNOSIS — I10 Essential (primary) hypertension: Secondary | ICD-10-CM

## 2018-11-07 DIAGNOSIS — Z23 Encounter for immunization: Secondary | ICD-10-CM

## 2018-11-07 DIAGNOSIS — E785 Hyperlipidemia, unspecified: Secondary | ICD-10-CM | POA: Diagnosis not present

## 2018-11-07 LAB — LIPID PANEL
Cholesterol: 138 mg/dL (ref 0–200)
HDL: 35.2 mg/dL — ABNORMAL LOW (ref >39.00)
LDL Cholesterol: 67 mg/dL (ref 0–99)
NonHDL: 103.1
Total CHOL/HDL Ratio: 4
Triglycerides: 182 mg/dL — ABNORMAL HIGH (ref 0.0–149.0)
VLDL: 36.4 mg/dL (ref 0.0–40.0)

## 2018-11-07 LAB — CBC WITH DIFFERENTIAL/PLATELET
Basophils Absolute: 0 10*3/uL (ref 0.0–0.1)
Basophils Relative: 0.6 % (ref 0.0–3.0)
Eosinophils Absolute: 0 10*3/uL (ref 0.0–0.7)
Eosinophils Relative: 0.1 % (ref 0.0–5.0)
HCT: 41.2 % (ref 36.0–46.0)
Hemoglobin: 13 g/dL (ref 12.0–15.0)
Lymphocytes Relative: 20.5 % (ref 12.0–46.0)
Lymphs Abs: 1.5 10*3/uL (ref 0.7–4.0)
MCHC: 31.6 g/dL (ref 30.0–36.0)
MCV: 78 fl (ref 78.0–100.0)
Monocytes Absolute: 0.5 10*3/uL (ref 0.1–1.0)
Monocytes Relative: 6.9 % (ref 3.0–12.0)
Neutro Abs: 5.1 10*3/uL (ref 1.4–7.7)
Neutrophils Relative %: 71.9 % (ref 43.0–77.0)
Platelets: 251 10*3/uL (ref 150.0–400.0)
RBC: 5.29 Mil/uL — ABNORMAL HIGH (ref 3.87–5.11)
RDW: 18.1 % — ABNORMAL HIGH (ref 11.5–15.5)
WBC: 7.2 10*3/uL (ref 4.0–10.5)

## 2018-11-07 LAB — HEPATIC FUNCTION PANEL
ALT: 16 U/L (ref 0–35)
AST: 18 U/L (ref 0–37)
Albumin: 4.2 g/dL (ref 3.5–5.2)
Alkaline Phosphatase: 91 U/L (ref 39–117)
Bilirubin, Direct: 0.1 mg/dL (ref 0.0–0.3)
Total Bilirubin: 0.4 mg/dL (ref 0.2–1.2)
Total Protein: 6.9 g/dL (ref 6.0–8.3)

## 2018-11-07 LAB — MICROALBUMIN / CREATININE URINE RATIO
Creatinine,U: 261.7 mg/dL
Microalb Creat Ratio: 1.1 mg/g (ref 0.0–30.0)
Microalb, Ur: 2.8 mg/dL — ABNORMAL HIGH (ref 0.0–1.9)

## 2018-11-07 LAB — BASIC METABOLIC PANEL
BUN: 15 mg/dL (ref 6–23)
CO2: 24 mEq/L (ref 19–32)
Calcium: 9.6 mg/dL (ref 8.4–10.5)
Chloride: 105 mEq/L (ref 96–112)
Creatinine, Ser: 0.77 mg/dL (ref 0.40–1.20)
GFR: 76.43 mL/min (ref >60.00)
Glucose, Bld: 89 mg/dL (ref 70–99)
Potassium: 4.3 mEq/L (ref 3.5–5.1)
Sodium: 141 mEq/L (ref 135–145)

## 2018-11-07 LAB — TSH: TSH: 1.35 u[IU]/mL (ref 0.35–4.50)

## 2018-11-07 LAB — HEMOGLOBIN A1C: Hgb A1c MFr Bld: 5.8 % (ref 4.6–6.5)

## 2018-11-07 NOTE — Addendum Note (Signed)
Addended by: Davis Gourd on: 11/07/2018 09:28 AM   Modules accepted: Orders

## 2018-11-07 NOTE — Assessment & Plan Note (Signed)
Ongoing issue.  UTD on eye exam.  Foot exam done today.  Microalbumin ordered.  Stressed need for healthy diet and regular exercise.  Check labs.  Adjust meds prn

## 2018-11-07 NOTE — Assessment & Plan Note (Signed)
Chronic problem.  Adequate control.  Asymptomatic w/ exception of ongoing SOB.  Check labs.  No anticipated med changes.  Will follow.

## 2018-11-07 NOTE — Progress Notes (Signed)
   Subjective:    Patient ID: Ariana [name], female    DOB: January 24, 1960, 58 y.o.   MRN: [id number]  HPI HTN- chronic problem, on Amlodipine 5mg  daily, Spironolactone 12.5mg  BID, Lasix 20mg  daily.  Denies CP, HAs, visual changes, edema.  + ongoing SOB.  Hyperlipidemia- chronic problem, on Crestor 20mg  daily.  No abd pain, N/V.  Pt is currently able to exercise for 10 minutes at a time before she starts wheezing.    DM- ongoing issue, on Jardiance 10mg  daily.  Due for Microalbumin, UTD on eye exam.  Due for foot exam today.  Denies symptomatic lows.  Denies numbness/tingling of hands/feet.  Obesity- weight is stable but BMI is 47.54   Review of Systems For ROS see HPI     Objective:   Physical Exam Vitals signs reviewed.  Constitutional:      General: She is not in acute distress.    Appearance: Normal appearance. She is well-developed. She is obese.  HENT:     Head: Normocephalic and atraumatic.  Eyes:     Conjunctiva/sclera: Conjunctivae normal.     Pupils: Pupils are equal, round, and reactive to light.  Neck:     Musculoskeletal: Normal range of motion and neck supple.     Thyroid: No thyromegaly.  Cardiovascular:     Rate and Rhythm: Normal rate and regular rhythm.     Heart sounds: Normal heart sounds. No murmur.  Pulmonary:     Effort: Pulmonary effort is normal. No respiratory distress.     Breath sounds: Normal breath sounds.  Abdominal:     General: There is no distension.     Palpations: Abdomen is soft.     Tenderness: There is no abdominal tenderness.  Lymphadenopathy:     Cervical: No cervical adenopathy.  Skin:    General: Skin is warm and dry.  Neurological:     Mental Status: She is alert and oriented to person, place, and time.  Psychiatric:        Behavior: Behavior normal.           Assessment & Plan:

## 2018-11-07 NOTE — Assessment & Plan Note (Signed)
Ongoing issue.  BMI is 47.54  Discussed need for regular exercise- even if she can only do 10 minutes, she can do this multiple times a day.  Will follow.

## 2018-11-07 NOTE — Patient Instructions (Signed)
Follow up in 3-4 months to recheck diabetes We'll notify you of your lab results and make any changes if needed Call and schedule your mammogram at your convenience Continue to work on healthy diet and regular exercise- you can do it!! Call with any questions or concerns Stay Safe!  Stay Healthy!

## 2018-11-07 NOTE — Assessment & Plan Note (Signed)
Chronic problem.  On Crestor 20mg daily.  Check labs.  Adjust meds prn  

## 2018-12-06 ENCOUNTER — Other Ambulatory Visit: Payer: Self-pay | Admitting: Family Medicine

## 2018-12-17 ENCOUNTER — Other Ambulatory Visit: Payer: Self-pay | Admitting: Family Medicine

## 2019-01-04 ENCOUNTER — Other Ambulatory Visit: Payer: Self-pay | Admitting: Family Medicine

## 2019-01-04 NOTE — Telephone Encounter (Signed)
Last OV 11/07/18 Alprazolam last filled 10/24/18 #30 with 0

## 2019-01-19 ENCOUNTER — Other Ambulatory Visit: Payer: Self-pay

## 2019-01-19 ENCOUNTER — Ambulatory Visit: Payer: BC Managed Care – PPO | Admitting: Pulmonary Disease

## 2019-01-19 ENCOUNTER — Encounter: Payer: Self-pay | Admitting: Pulmonary Disease

## 2019-01-19 DIAGNOSIS — R0600 Dyspnea, unspecified: Secondary | ICD-10-CM

## 2019-01-19 DIAGNOSIS — R0609 Other forms of dyspnea: Secondary | ICD-10-CM

## 2019-01-19 MED ORDER — ALBUTEROL SULFATE HFA 108 (90 BASE) MCG/ACT IN AERS: 2.0000 | INHALATION_SPRAY | Freq: Four times a day (QID) | RESPIRATORY_TRACT | 6 refills | Status: DC | PRN

## 2019-01-19 MED ORDER — FUROSEMIDE 20 MG PO TABS: 20.0000 mg | ORAL_TABLET | Freq: Every day | ORAL | 0 refills | Status: DC

## 2019-01-19 MED ORDER — BUDESONIDE-FORMOTEROL FUMARATE 160-4.5 MCG/ACT IN AERO: 2.0000 | INHALATION_SPRAY | Freq: Two times a day (BID) | RESPIRATORY_TRACT | 6 refills | Status: DC

## 2019-01-19 NOTE — Patient Instructions (Signed)
Refills on Symbicort and albuterol.  I do feel her shortness of breath may be related to fluid retention Take Lasix 20 mg daily for 10 days -would like your weight to drop by 5 to 10 pounds and see if this improves your breathing

## 2019-01-19 NOTE — Progress Notes (Signed)
   Subjective:    Patient ID: Ariana [name], female    DOB: 07-Jan-1960, 59 y.o.   MRN: [id number]  HPI  59 yo obese remote smoker for follow-up of dyspnea on exertion since 2018 She reports intermittent wheezing on exertion. Cardiac evaluation showed diastolic dysfunction.  She has mild pedal edema while on amlodipine for which she uses Lasix and Aldactone was added by cardiology.    Chief Complaint  Patient presents with  . Follow-up    Patient is here for a follow up for shortness of breath with exertion. Patient states that her symptoms are worse since last visit.    She was started empirically on Symbicort and this seems to help.  She rarely needs albuterol. Complains of persistent dyspnea on exertion. Has gained weight from 266 in  07/2017 to 274 now No nocturnal symptoms   Significant tests/ events reviewed  PFT8/2019 no evidence of obstruction - ratio 74, FEV1 79%, FVC 83% Mild small airway disease.   12/2017 CT chest was negative for obstructive CAD ,no lung pathology.  Echocardiogram showed EF 60-65%. LV with normal systolic function. Grade 2 diastolic dysfunction.      Review of Systems neg for any significant sore throat, dysphagia, itching, sneezing, nasal congestion or excess/ purulent secretions, fever, chills, sweats, unintended wt loss, pleuritic or exertional cp, hempoptysis, orthopnea pnd or change in chronic leg swelling. Also denies presyncope, palpitations, heartburn, abdominal pain, nausea, vomiting, diarrhea or change in bowel or urinary habits, dysuria,hematuria, rash, arthralgias, visual complaints, headache, numbness weakness or ataxia.     Objective:   Physical Exam   Gen. Pleasant, obese, in no distress ENT - no lesions, no post nasal drip Neck: No JVD, no thyromegaly, no carotid bruits Lungs: no use of accessory muscles, no dullness to percussion, decreased without rales or rhonchi  Cardiovascular: Rhythm regular, heart sounds  normal,  no murmurs or gallops, !+ peripheral edema Musculoskeletal: No deformities, no cyanosis or clubbing , no tremors        Assessment & Plan:

## 2019-01-21 NOTE — Assessment & Plan Note (Addendum)
I do feel her shortness of breath may be related to fluid retention.  Differential diagnosis here is severe deconditioning.  No evidence of pulmonary hypertension on echo Take Lasix 20 mg daily for 10 days -would like your weight to drop by 5 to 10 pounds and see if this improves your breathing  Doubt true asthma but will refill Refills on Symbicort and albuterol.

## 2019-01-23 ENCOUNTER — Ambulatory Visit: Payer: BC Managed Care – PPO | Admitting: Cardiovascular Disease

## 2019-01-23 ENCOUNTER — Encounter: Payer: Self-pay | Admitting: Cardiovascular Disease

## 2019-01-23 ENCOUNTER — Other Ambulatory Visit: Payer: Self-pay

## 2019-01-23 VITALS — BP 144/86 | HR 100 | Temp 97.1°F | Ht 63.0 in | Wt 267.6 lb

## 2019-01-23 DIAGNOSIS — I519 Heart disease, unspecified: Secondary | ICD-10-CM | POA: Diagnosis not present

## 2019-01-23 DIAGNOSIS — R0609 Other forms of dyspnea: Secondary | ICD-10-CM

## 2019-01-23 DIAGNOSIS — E119 Type 2 diabetes mellitus without complications: Secondary | ICD-10-CM

## 2019-01-23 DIAGNOSIS — Z8249 Family history of ischemic heart disease and other diseases of the circulatory system: Secondary | ICD-10-CM

## 2019-01-23 DIAGNOSIS — I10 Essential (primary) hypertension: Secondary | ICD-10-CM | POA: Diagnosis not present

## 2019-01-23 DIAGNOSIS — E785 Hyperlipidemia, unspecified: Secondary | ICD-10-CM

## 2019-01-23 DIAGNOSIS — I5189 Other ill-defined heart diseases: Secondary | ICD-10-CM

## 2019-01-23 DIAGNOSIS — R0600 Dyspnea, unspecified: Secondary | ICD-10-CM | POA: Diagnosis not present

## 2019-01-23 MED ORDER — BISOPROLOL FUMARATE 5 MG PO TABS: 5.0000 mg | ORAL_TABLET | Freq: Every day | ORAL | 2 refills | Status: DC

## 2019-01-23 NOTE — Patient Instructions (Signed)
Medication Instructions:  START- Bisoprolol 5 mg take 2.5 mg (1/2 tablets) for 2 weeks then increase to 5 mg by mouth daily  *If you need a refill on your cardiac medications before your next appointment, please call your pharmacy*  Lab Work: None Ordered  Testing/Procedures: None Ordered  Follow-Up: At [facility], you and your health needs are our priority.  As part of our continuing mission to provide you with exceptional heart care, we have created designated Provider Care Teams.  These Care Teams include your primary Cardiologist (physician) and Advanced Practice Providers (APPs -  Physician Assistants and Nurse Practitioners) who all work together to provide you with the care you need, when you need it.  Your next appointment:   9 month(s)  The format for your next appointment:   In Person  Provider:   Shelva Majestic, MD

## 2019-01-23 NOTE — Progress Notes (Signed)
Cardiology Office Note    Date:  01/25/2019   ID:  Brenly, Trawick 1960/09/03, MRN [id number]  PCP:  Midge Minium, MD  Cardiologist:  Shelva Majestic, MD    History of Present Illness:  Ariana [name] is a 59 y.o. female who was initially referred through the courtesy of Dr. Birdie Riddle for evaluation of exertional shortness of breath and recent documentation of grade 2 diastolic dysfunction.  I initially saw her in October 2019.  She presents for a 43-monthfollow-up evaluation.  Ms. PAffinitodenies any known cardiac history.  She has a history of obesity, hypertension, obstructive sleep apnea, GERD, and recently has started to notice a change in symptoms with development of exertional shortness of breath.  She had smoked during her late teenage years for approximately 3 to 4 years but quit in 1982.  She was evaluated from a pulmonary standpoint by Drs. AElsworth Sohoand EDaine Gravel  Pulmonary function studies did not reveal evidence for obstruction.  She had mild small airway disease.  An echo Doppler study revealed an ejection fraction of 60 to 65% with normal wall motion and showed grade 2 diastolic dysfunction.  A chest x-ray did not reveal acute cardiopulmonary disease.  A d-dimer was negative.  BNP was 41.  Upon further questioning she states that recently she also has noticed some mild exertional chest tightness since April.  Oftentimes she develops this when she is out of breath.  If she were to walk fast she will have to stop to catch her breath.  She lives by herself.  Earlier this year she had laboratory which had shown a total cholesterol 171 LDL cholesterol 97 with triglycerides at 101.  At that time her potassium was low at 3.2.  She has recently been on amlodipine 5 mg and hydrochlorothiazide 25 mg in addition to supplemental potassium 20 mEq.  She has been on simvastatin 40 mg for hyperlipidemia.  She has been taking Lasix as needed.  She does have a history of panic attacks and  anxiety and depression for which she is on Xanax as well as Wellbutrin XL and Prozac.    During her initial evaluation with me in October 2019 and with her grade 2 diastolic dysfunction I recommended discontinuance of hydrochlorothiazide and supplemental pad potassium and initiate spironolactone therapy.  With her father dying of an MI at age 3528and symptoms of exertional dyspnea with chest fullness I recommended CT coronary angio for further evaluation.  Her LDL cholesterol was 97 I recommend more aggressive lipid therapy with discontinuance of simvastatin and changing to rosuvastatin.  She was seen by HAlmyra Deforest PA in December 2019 for follow-up evaluation.  Her coronary CTA showed a minimal calcium score of 3.89.  She was not found to have evidence for obstructive CAD with very minimal plaque in the proximal LAD.  Since I last saw her, she has been seen by Dr. AElsworth Sohoand was started on inhaler therapy for asthma.  She also was started on Jardiance for diabetes mellitus and had a hemoglobin A1c of 7.3.  She has noticed some trace ankle edema.  At times she has noticed an occasional palpitation.  She is now on Symbicort and last week she was recently started on furosemide 20 mg.  She is on spironolactone 12.5 mg twice a day in addition to Jardiance 10 mg daily and amlodipine 5 mg.  She is now on rosuvastatin 20 mg for hyperlipidemia. She presents for evaluation.  Past Medical  History:  Diagnosis Date  . Anxiety   . Depression   . Hyperlipidemia   . Hypertension   . OA (osteoarthritis)    in Bil knees/ gets steroid injections for pain  . Ovarian cyst    right ovary/had ablation    Past Surgical History:  Procedure Laterality Date  . ENDOMETRIAL ABLATION  2012  . OVARIAN CYST REMOVAL Right 2012    Current Medications: Outpatient Medications Prior to Visit  Medication Sig Dispense Refill  . albuterol (VENTOLIN HFA) 108 (90 Base) MCG/ACT inhaler Inhale 2 puffs into the lungs every 6 (six)  hours as needed for wheezing or shortness of breath. 18 g 6  . ALPRAZolam (XANAX) 1 MG tablet TAKE 1/2 TABLET BY MOUTH TWICE A DAY AS NEEDED FOR ANXIETY 30 tablet 0  . amLODipine (NORVASC) 5 MG tablet TAKE 1 TABLET BY MOUTH EVERY DAY 90 tablet 1  . budesonide-formoterol (SYMBICORT) 160-4.5 MCG/ACT inhaler Inhale 2 puffs into the lungs 2 (two) times daily. 1 Inhaler 6  . buPROPion (WELLBUTRIN XL) 300 MG 24 hr tablet TAKE 1 TABLET BY MOUTH EVERY DAY 90 tablet 1  . empagliflozin (JARDIANCE) 10 MG TABS tablet Take 10 mg by mouth daily. 30 tablet 6  . furosemide (LASIX) 20 MG tablet TAKE 1 TABLET BY MOUTH EVERY DAY AS NEEDED 90 tablet 1  . furosemide (LASIX) 20 MG tablet Take 1 tablet (20 mg total) by mouth daily for 10 days. 10 tablet 0  . ibuprofen (ADVIL,MOTRIN) 200 MG tablet Take 200 mg by mouth as needed.    Marland Kitchen omeprazole (PRILOSEC) 40 MG capsule TAKE 1 CAPSULE BY MOUTH EVERY DAY 90 capsule 1  . rosuvastatin (CRESTOR) 20 MG tablet TAKE 1 TABLET BY MOUTH EVERY DAY 90 tablet 3  . spironolactone (ALDACTONE) 25 MG tablet TAKE 0.5 TABLETS (12.5 MG TOTAL) BY MOUTH 2 (TWO) TIMES DAILY. 90 tablet 3   No facility-administered medications prior to visit.     Allergies:   Penicillins   Social History   Socioeconomic History  . Marital status: Legally Separated    Spouse name: Not on file  . Number of children: Not on file  . Years of education: Not on file  . Highest education level: Not on file  Occupational History  . Not on file  Tobacco Use  . Smoking status: Former Smoker    Quit date: 01/18/1981    Years since quitting: 38.0  . Smokeless tobacco: Never Used  . Tobacco comment: pt states she smoker for 3 years in high school (10/28/17)  Substance and Sexual Activity  . Alcohol use: No  . Drug use: No  . Sexual activity: Never  Other Topics Concern  . Not on file  Social History Narrative  . Not on file   Social Determinants of Health   Financial Resource Strain:   . Difficulty  of Paying Living Expenses: Not on file  Food Insecurity:   . Worried About Charity fundraiser in the Last Year: Not on file  . Ran Out of Food in the Last Year: Not on file  Transportation Needs:   . Lack of Transportation (Medical): Not on file  . Lack of Transportation (Non-Medical): Not on file  Physical Activity:   . Days of Exercise per Week: Not on file  . Minutes of Exercise per Session: Not on file  Stress:   . Feeling of Stress : Not on file  Social Connections:   . Frequency of Communication with Friends and  Family: Not on file  . Frequency of Social Gatherings with Friends and Family: Not on file  . Attends Religious Services: Not on file  . Active Member of Clubs or Organizations: Not on file  . Attends Archivist Meetings: Not on file  . Marital Status: Not on file    She is separated and single.  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  There is no alcohol use.  She does not routinely exercise.  Family History:  The patient's family history includes Arthritis in her brother and father; Dementia in her mother; Diabetes in her brother; Heart attack in her father; Heart disease in her brother and father; Hyperlipidemia in her father; Hypertension in her father; Liver disease in her brother.   Her mother died at age 49 with dementia and had thyroid problems.  Her father died with a heart attack at age 71.  She has been depressed since her son age 22 died in a motor vehicle accident.  ROS General: Negative; No fevers, chills, or night sweats;  HEENT: Negative; No changes in vision or hearing, sinus congestion, difficulty swallowing Pulmonary: Negative; No cough, wheezing, shortness of breath, hemoptysis Cardiovascular: Positive for recent diagnosis of asthma GI: Negative; No nausea, vomiting, diarrhea, or abdominal pain GU: Negative; No dysuria, hematuria, or difficulty voiding; status post right ovary and cyst removed in  2009 Musculoskeletal: Negative; no myalgias, joint pain, or weakness Hematologic/Oncology: Negative; no easy bruising, bleeding Endocrine: Negative; no heat/cold intolerance; no diabetes Neuro: Negative; no changes in balance, headaches Skin: Negative; No rashes or skin lesions Psychiatric: Positive for anxiety and depression Sleep: Negative; No snoring, daytime sleepiness, hypersomnolence, bruxism, restless legs, hypnogognic hallucinations, no cataplexy Other comprehensive 14 point system review is negative.   PHYSICAL EXAM:   VS:  BP (!) 144/86   Pulse 100   Temp (!) 97.1 F (36.2 C)   Ht 5' 3"  (1.6 m)   Wt 267 lb 9.6 oz (121.4 kg)   SpO2 99%   BMI 47.40 kg/m     Repeat blood pressure by me 136/80  Wt Readings from Last 3 Encounters:  01/23/19 267 lb 9.6 oz (121.4 kg)  01/19/19 274 lb 3.2 oz (124.4 kg)  11/07/18 268 lb 6 oz (121.7 kg)    General: Alert, oriented, no distress.  Skin: normal turgor, no rashes, warm and dry HEENT: Normocephalic, atraumatic. Pupils equal round and reactive to light; sclera anicteric; extraocular muscles intact;  Nose without nasal septal hypertrophy Mouth/Parynx benign; Mallinpatti scale 3 Neck: No JVD, no carotid bruits; normal carotid upstroke Lungs: clear to ausculatation and percussion; no wheezing or rales Chest wall: without tenderness to palpitation Heart: PMI not displaced, RRR, s1 s2 normal, 1/6 systolic murmur, no diastolic murmur, no rubs, gallops, thrills, or heaves Abdomen: soft, nontender; no hepatosplenomehaly, BS+; abdominal aorta nontender and not dilated by palpation. Back: no CVA tenderness Pulses 2+ Musculoskeletal: full range of motion, normal strength, no joint deformities Extremities: Trace ankle edema bilaterally; no clubbing cyanosis, Homan's sign negative  Neurologic: grossly nonfocal; Cranial nerves grossly wnl Psychologic: Normal mood and affect   Studies/Labs Reviewed:   EKG:  EKG is ordered today. ECG  (independently read by me): Normal sinus rhythm at 95 bpm.  No ectopy.  Normal intervals.  October 2019 ECG (independently read by me): Normal sinus rhythm with isolated PVC.  Nonspecific ST-T changes.  PR interval 140 ms QTc interval 454 ms.  Recent Labs: BMP Latest Ref Rng &  Units 11/07/2018 05/06/2018 12/01/2017  Glucose 70 - 99 mg/dL 89 112(H) 101(H)  BUN 6 - 23 mg/dL 15 15 16   Creatinine 0.40 - 1.20 mg/dL 0.77 0.89 0.83  Sodium 135 - 145 mEq/L 141 140 140  Potassium 3.5 - 5.1 mEq/L 4.3 4.5 4.0  Chloride 96 - 112 mEq/L 105 101 105  CO2 19 - 32 mEq/L 24 30 27   Calcium 8.4 - 10.5 mg/dL 9.6 9.4 9.4     Hepatic Function Latest Ref Rng & Units 11/07/2018 05/06/2018 12/01/2017  Total Protein 6.0 - 8.3 g/dL 6.9 7.0 7.2  Albumin 3.5 - 5.2 g/dL 4.2 4.0 3.9  AST 0 - 37 U/L 18 24 18   ALT 0 - 35 U/L 16 23 22   Alk Phosphatase 39 - 117 U/L 91 103 86  Total Bilirubin 0.2 - 1.2 mg/dL 0.4 0.4 0.3  Bilirubin, Direct 0.0 - 0.3 mg/dL 0.1 0.1 -    CBC Latest Ref Rng & Units 11/07/2018 05/06/2018 07/22/2017  WBC 4.0 - 10.5 K/uL 7.2 8.7 9.3  Hemoglobin 12.0 - 15.0 g/dL 13.0 12.9 13.6  Hematocrit 36.0 - 46.0 % 41.2 40.2 41.0  Platelets 150.0 - 400.0 K/uL 251.0 279.0 279.0   Lab Results  Component Value Date   MCV 78.0 11/07/2018   MCV 77.3 (L) 05/06/2018   MCV 80.1 07/22/2017   Lab Results  Component Value Date   TSH 1.35 11/07/2018   Lab Results  Component Value Date   HGBA1C 5.8 11/07/2018     BNP No results found for: BNP  ProBNP    Component Value Date/Time   PROBNP 41.0 07/30/2017 1553     Lipid Panel     Component Value Date/Time   CHOL 138 11/07/2018 0917   TRIG 182.0 (H) 11/07/2018 0917   HDL 35.20 (L) 11/07/2018 0917   CHOLHDL 4 11/07/2018 0917   VLDL 36.4 11/07/2018 0917   LDLCALC 67 11/07/2018 0917   LDLDIRECT 99.0 08/29/2015 1417     RADIOLOGY: No results found.   Additional studies/ records that were reviewed today include:  I reviewed the office records of  Dr. Annye Asa, the pulmonology evaluations, recent laboratory, echo Doppler assessment and PFTs. Coronary CTA was reviewed.  ASSESSMENT:    1. Essential hypertension   2. DOE (dyspnea on exertion)   3. Grade II diastolic dysfunction   4. Hyperlipidemia with target LDL less than 70   5. Family history of early CAD   54. Morbid obesity ([place])   7. Type 2 diabetes mellitus without complication, without long-term current use of insulin [facility])    PLAN:  Ms. Tegan Britain is a 59 year old female who has a history of morbid obesity, hypertension, anxiety/depression, and when I initially saw her in October 2019 had noticed a definite change with development of increasing exertional dyspnea and also has noticed some exertional chest fullness and pressure.  She was not very active due to arthritis in her knees.  She was found to have grade 2 diastolic dysfunction on her echocardiography and had onset of EF at 60 to 65%.  She was taken off hydrochlorothiazide and has been on spironolactone currently at 12.5 mg twice a day.  She tells me she was just recently started also on furosemide and has recently started Symbicort.  She is now diabetic and is on Jardiance 10 mg which also will be helpful for CHF.  Her blood pressure today is slightly elevated despite amlodipine 5 mg in addition to spironolactone 12.5 mg twice a day.  At times she admits to occasional palpitations.  Her resting pulse is 100.  I have recommended the addition of low-dose cardioselective beta-blockade with bisoprolol and she will start 2.5 mg daily for the first 1 to 2 weeks and if her heart rate continues to be in the 90s to 100 range she will further titrate this to 5 mg daily.  She is morbidly obese and weight loss was recommended as well as exercise.  She continues to be on rosuvastatin 20 mg daily for hyperlipidemia.  Most recent lipid studies in November 2020 were improved with total cholesterol 138 LDL cholesterol 67.  However  triglycerides remain mildly increased at 182.  We discussed the reduction of carbohydrates and sweets.  She has been demonstrated to have minimal coronary plaque on CTA.  She will be seeing Dr. Birdie Riddle in February and has pulmonary follow-up in July.  As result I will see her in September 2021 for follow-up evaluation.   Medication Adjustments/Labs and Tests Ordered: Current medicines are reviewed at length with the patient today.  Concerns regarding medicines are outlined above.  Medication changes, Labs and Tests ordered today are listed in the Patient Instructions below. Patient Instructions  Medication Instructions:  START- Bisoprolol 5 mg take 2.5 mg (1/2 tablets) for 2 weeks then increase to 5 mg by mouth daily  *If you need a refill on your cardiac medications before your next appointment, please call your pharmacy*  Lab Work: None Ordered  Testing/Procedures: None Ordered  Follow-Up: At [facility], you and your health needs are our priority.  As part of our continuing mission to provide you with exceptional heart care, we have created designated Provider Care Teams.  These Care Teams include your primary Cardiologist (physician) and Advanced Practice Providers (APPs -  Physician Assistants and Nurse Practitioners) who all work together to provide you with the care you need, when you need it.  Your next appointment:   9 month(s)  The format for your next appointment:   In Person  Provider:   Shelva Majestic, MD       Signed, Shelva Majestic, MD  01/25/2019 7:24 PM    [facility] [address] [place], [place], [place]  [address] Phone: [phone]

## 2019-01-25 ENCOUNTER — Encounter: Payer: Self-pay | Admitting: Cardiovascular Disease

## 2019-02-14 ENCOUNTER — Ambulatory Visit (INDEPENDENT_AMBULATORY_CARE_PROVIDER_SITE_OTHER): Payer: BC Managed Care – PPO | Admitting: Family Medicine

## 2019-02-14 ENCOUNTER — Other Ambulatory Visit: Payer: Self-pay

## 2019-02-14 ENCOUNTER — Encounter: Payer: Self-pay | Admitting: Family Medicine

## 2019-02-14 DIAGNOSIS — E119 Type 2 diabetes mellitus without complications: Secondary | ICD-10-CM | POA: Diagnosis not present

## 2019-02-14 NOTE — Progress Notes (Signed)
Virtual Visit via Video   I connected with patient on 02/14/19 at 10:30 AM EST by a video enabled telemedicine application and verified that I am speaking with the correct person using two identifiers.  Location patient: Home Location provider: Acup[facility]urist, Office Persons participating in the virtual visit: Patient, Provider, [place] (Jess B)  I discussed the limitations of evaluation and management by telemedicine and the availability of in person appointments. The patient expressed understanding and agreed to proceed.  Subjective:   HPI:   DM- chronic problem, on Jardiance 10mg  daily.  UTD on eye exam, microalbumin, and foot exam.  Last A1C 5.8.  Pt reports increased fatigue recently- corresponds w/ starting Bisoprolol.  Pt is walking 10-15 at least every other day.  No CP.  Continues to have SOB- has seen pulmonary.  Denies HAs, visual changes, abd pain, N/V.  No numbness/tingling of hands/feet.  Denies symptomatic lows.  ROS:   See pertinent positives and negatives per HPI.  Patient Active Problem List   Diagnosis Date Noted  . Diabetes mellitus without complication ([place]) 123XX123  . DOE (dyspnea on exertion) 07/30/2017  . GERD (gastroesophageal reflux disease) 07/22/2017  . Vitamin D deficiency 03/22/2017  . Degenerative arthritis of knee, bilateral 03/22/2017  . Peripheral edema 07/28/2016  . Urticaria 07/28/2016  . Physical exam 03/05/2016  . Allergic rhinitis 10/31/2015  . Severe obesity (BMI >= 40) ([place]) 08/29/2015  . Anxiety and depression 08/29/2015  . HTN (hypertension) 08/29/2015  . Hyperlipidemia 08/29/2015    Social History   Tobacco Use  . Smoking status: Former Smoker    Quit date: 01/18/1981    Years since quitting: 38.0  . Smokeless tobacco: Never Used  . Tobacco comment: pt states she smoker for 3 years in high school (10/28/17)  Substance Use Topics  . Alcohol use: No    Current Outpatient Medications:  .  albuterol (VENTOLIN HFA) 108  (90 Base) MCG/ACT inhaler, Inhale 2 puffs into the lungs every 6 (six) hours as needed for wheezing or shortness of breath., Disp: 18 g, Rfl: 6 .  ALPRAZolam (XANAX) 1 MG tablet, TAKE 1/2 TABLET BY MOUTH TWICE A DAY AS NEEDED FOR ANXIETY, Disp: 30 tablet, Rfl: 0 .  amLODipine (NORVASC) 5 MG tablet, TAKE 1 TABLET BY MOUTH EVERY DAY, Disp: 90 tablet, Rfl: 1 .  bisoprolol (ZEBETA) 5 MG tablet, Take 1 tablet (5 mg total) by mouth daily., Disp: 90 tablet, Rfl: 2 .  budesonide-formoterol (SYMBICORT) 160-4.5 MCG/ACT inhaler, Inhale 2 puffs into the lungs 2 (two) times daily., Disp: 1 Inhaler, Rfl: 6 .  buPROPion (WELLBUTRIN XL) 300 MG 24 hr tablet, TAKE 1 TABLET BY MOUTH EVERY DAY, Disp: 90 tablet, Rfl: 1 .  empagliflozin (JARDIANCE) 10 MG TABS tablet, Take 10 mg by mouth daily., Disp: 30 tablet, Rfl: 6 .  ibuprofen (ADVIL,MOTRIN) 200 MG tablet, Take 200 mg by mouth as needed., Disp: , Rfl:  .  omeprazole (PRILOSEC) 40 MG capsule, TAKE 1 CAPSULE BY MOUTH EVERY DAY, Disp: 90 capsule, Rfl: 1 .  rosuvastatin (CRESTOR) 20 MG tablet, TAKE 1 TABLET BY MOUTH EVERY DAY, Disp: 90 tablet, Rfl: 3 .  spironolactone (ALDACTONE) 25 MG tablet, TAKE 0.5 TABLETS (12.5 MG TOTAL) BY MOUTH 2 (TWO) TIMES DAILY., Disp: 90 tablet, Rfl: 3 .  furosemide (LASIX) 20 MG tablet, Take 1 tablet (20 mg total) by mouth daily for 10 days., Disp: 10 tablet, Rfl: 0  Allergies  Allergen Reactions  . Penicillins Hives    Objective:  There were no vitals taken for this visit. AAOx3, NAD NCAT, EOMI No obvious CN deficits Coloring WNL Pt is able to speak clearly, coherently without shortness of breath or increased work of breathing.  Thought process is linear.  Mood is appropriate.   Assessment and Plan:   DM- chronic problem.  Recently w/ excellent control.  UTD on eye exam, foot exam, and microalbumin.  Has started walking more regularly.  Applauded her efforts.  Check labs.  Adjust meds prn    Annye Asa, MD 02/14/2019

## 2019-02-14 NOTE — Progress Notes (Signed)
I have discussed the procedure for the virtual visit with the patient who has given consent to proceed with assessment and treatment.   Pt unable to obtain vitals.   Regan Mcbryar L Phaedra Colgate, CMA     

## 2019-02-16 ENCOUNTER — Other Ambulatory Visit: Payer: Self-pay

## 2019-02-16 ENCOUNTER — Ambulatory Visit (INDEPENDENT_AMBULATORY_CARE_PROVIDER_SITE_OTHER): Payer: BC Managed Care – PPO | Admitting: *Deleted

## 2019-02-16 DIAGNOSIS — E119 Type 2 diabetes mellitus without complications: Secondary | ICD-10-CM

## 2019-02-17 LAB — BASIC METABOLIC PANEL
BUN: 17 mg/dL (ref 6–23)
CO2: 23 mEq/L (ref 19–32)
Calcium: 9.2 mg/dL (ref 8.4–10.5)
Chloride: 105 mEq/L (ref 96–112)
Creatinine, Ser: 1.22 mg/dL — ABNORMAL HIGH (ref 0.40–1.20)
GFR: 44.9 mL/min — ABNORMAL LOW (ref >60.00)
Glucose, Bld: 77 mg/dL (ref 70–99)
Potassium: 3.8 mEq/L (ref 3.5–5.1)
Sodium: 139 mEq/L (ref 135–145)

## 2019-02-17 LAB — HEMOGLOBIN A1C: Hgb A1c MFr Bld: 6.6 % — ABNORMAL HIGH (ref 4.6–6.5)

## 2019-02-21 ENCOUNTER — Other Ambulatory Visit: Payer: Self-pay | Admitting: Family Medicine

## 2019-02-21 DIAGNOSIS — R799 Abnormal finding of blood chemistry, unspecified: Secondary | ICD-10-CM

## 2019-02-21 DIAGNOSIS — R7989 Other specified abnormal findings of blood chemistry: Secondary | ICD-10-CM

## 2019-02-22 ENCOUNTER — Ambulatory Visit (INDEPENDENT_AMBULATORY_CARE_PROVIDER_SITE_OTHER): Payer: BC Managed Care – PPO | Admitting: *Deleted

## 2019-02-22 ENCOUNTER — Other Ambulatory Visit: Payer: Self-pay

## 2019-02-22 DIAGNOSIS — R799 Abnormal finding of blood chemistry, unspecified: Secondary | ICD-10-CM

## 2019-02-22 DIAGNOSIS — R7989 Other specified abnormal findings of blood chemistry: Secondary | ICD-10-CM

## 2019-02-22 LAB — BASIC METABOLIC PANEL
BUN: 14 mg/dL (ref 6–23)
CO2: 27 mEq/L (ref 19–32)
Calcium: 9.2 mg/dL (ref 8.4–10.5)
Chloride: 106 mEq/L (ref 96–112)
Creatinine, Ser: 0.87 mg/dL (ref 0.40–1.20)
GFR: 66.32 mL/min (ref >60.00)
Glucose, Bld: 95 mg/dL (ref 70–99)
Potassium: 3.8 mEq/L (ref 3.5–5.1)
Sodium: 139 mEq/L (ref 135–145)

## 2019-03-01 ENCOUNTER — Other Ambulatory Visit: Payer: Self-pay | Admitting: Family Medicine

## 2019-03-02 NOTE — Telephone Encounter (Signed)
Last refill: 12.30.20 #30, 0 Last OV: 2.9.21 dx. DM f/u

## 2019-04-16 ENCOUNTER — Other Ambulatory Visit: Payer: Self-pay | Admitting: Family Medicine

## 2019-04-17 NOTE — Telephone Encounter (Signed)
Last OV 02/14/19 Alprazolam last filled 03/02/19 #30 with 0

## 2019-05-01 ENCOUNTER — Other Ambulatory Visit: Payer: Self-pay | Admitting: Family Medicine

## 2019-05-08 ENCOUNTER — Encounter: Payer: Self-pay | Admitting: Family Medicine

## 2019-05-08 ENCOUNTER — Ambulatory Visit (INDEPENDENT_AMBULATORY_CARE_PROVIDER_SITE_OTHER): Payer: BC Managed Care – PPO | Admitting: Family Medicine

## 2019-05-08 ENCOUNTER — Other Ambulatory Visit: Payer: Self-pay

## 2019-05-08 VITALS — BP 126/86 | HR 89 | Temp 98.0°F | Resp 17 | Ht 63.0 in | Wt 268.5 lb

## 2019-05-08 DIAGNOSIS — Z0000 Encounter for general adult medical examination without abnormal findings: Secondary | ICD-10-CM | POA: Diagnosis not present

## 2019-05-08 DIAGNOSIS — E559 Vitamin D deficiency, unspecified: Secondary | ICD-10-CM

## 2019-05-08 DIAGNOSIS — E119 Type 2 diabetes mellitus without complications: Secondary | ICD-10-CM | POA: Diagnosis not present

## 2019-05-08 NOTE — Assessment & Plan Note (Signed)
Ongoing issue for pt.  Stressed need for healthy diet and regular exercise.  Check labs.  Will follow. 

## 2019-05-08 NOTE — Patient Instructions (Addendum)
Follow up in 4 months to recheck diabetes We'll notify you of your lab results and make any changes if needed Continue to work on healthy diet and regular exercise- you can do it! Call and schedule your mammogram Call with any questions or concerns Stay Safe!  Stay Healthy! Happy Spring!

## 2019-05-08 NOTE — Assessment & Plan Note (Signed)
Pt has hx of this.  Check labs.  Replete prn. 

## 2019-05-08 NOTE — Assessment & Plan Note (Signed)
Pt's PE WNL w/ exception of obesity.  UTD on pap, colonoscopy, immunizations.  Check labs.  Anticipatory guidance provided.  

## 2019-05-08 NOTE — Progress Notes (Signed)
   Subjective:    Patient ID: Ariana [name], female    DOB: [birth year], 59 y.o.   MRN: [id number]  HPI CPE- UTD on pap, due for mammo.  UTD on colonoscopy.  UTD on eye exam, foot exam.   Review of Systems Patient reports no vision/ hearing changes, adenopathy,fever, weight change,  persistant/recurrent hoarseness , swallowing issues, chest pain, palpitations, edema, persistant/recurrent cough, hemoptysis, dyspnea (rest/exertional/paroxysmal nocturnal), gastrointestinal bleeding (melena, rectal bleeding), abdominal pain, significant heartburn, bowel changes, GU symptoms (dysuria, hematuria, incontinence), Gyn symptoms (abnormal  bleeding, pain),  syncope, focal weakness, memory loss, numbness & tingling, skin/hair/nail changes, abnormal bruising or bleeding, anxiety, or depression.   This visit occurred during the SARS-CoV-2 public health emergency.  Safety protocols were in place, including screening questions prior to the visit, additional usage of staff PPE, and extensive cleaning of exam room while observing appropriate contact time as indicated for disinfecting solutions.       Objective:   Physical Exam General Appearance:    Alert, cooperative, no distress, appears stated age, obese  Head:    Normocephalic, without obvious abnormality, atraumatic  Eyes:    PERRL, conjunctiva/corneas clear, EOM's intact, fundi    benign, both eyes  Ears:    Normal TM's and external ear canals, both ears  Nose:   Deferred due to COVID  Throat:   Neck:   Supple, symmetrical, trachea midline, no adenopathy;    Thyroid: no enlargement/tenderness/nodules  Back:     Symmetric, no curvature, ROM normal, no CVA tenderness  Lungs:     Clear to auscultation bilaterally, respirations unlabored  Chest Wall:    No tenderness or deformity   Heart:    Regular rate and rhythm, S1 and S2 normal, no murmur, rub   or gallop  Breast Exam:    Deferred to GYN  Abdomen:     Soft, non-tender, bowel sounds active all  four quadrants,    no masses, no organomegaly  Genitalia:    Deferred to GYN  Rectal:    Extremities:   Extremities normal, atraumatic, no cyanosis or edema  Pulses:   2+ and symmetric all extremities  Skin:   Skin color, texture, turgor normal, no rashes or lesions  Lymph nodes:   Cervical, supraclavicular, and axillary nodes normal  Neurologic:   CNII-XII intact, normal strength, sensation and reflexes    throughout          Assessment & Plan:

## 2019-05-08 NOTE — Assessment & Plan Note (Signed)
Currently diet controlled.  UTD on eye exam, foot exam, microalbumin.  Check labs.  Start meds prn.

## 2019-05-09 LAB — BASIC METABOLIC PANEL
BUN: 17 mg/dL (ref 6–23)
CO2: 25 mEq/L (ref 19–32)
Calcium: 9.6 mg/dL (ref 8.4–10.5)
Chloride: 104 mEq/L (ref 96–112)
Creatinine, Ser: 1.1 mg/dL (ref 0.40–1.20)
GFR: 50.56 mL/min — ABNORMAL LOW (ref >60.00)
Glucose, Bld: 77 mg/dL (ref 70–99)
Potassium: 3.8 mEq/L (ref 3.5–5.1)
Sodium: 138 mEq/L (ref 135–145)

## 2019-05-09 LAB — CBC WITH DIFFERENTIAL/PLATELET
Basophils Absolute: 0.1 10*3/uL (ref 0.0–0.1)
Basophils Relative: 0.7 % (ref 0.0–3.0)
Eosinophils Absolute: 0 10*3/uL (ref 0.0–0.7)
Eosinophils Relative: 0.1 % (ref 0.0–5.0)
HCT: 39.8 % (ref 36.0–46.0)
Hemoglobin: 12.7 g/dL (ref 12.0–15.0)
Lymphocytes Relative: 20.5 % (ref 12.0–46.0)
Lymphs Abs: 2 10*3/uL (ref 0.7–4.0)
MCHC: 31.8 g/dL (ref 30.0–36.0)
MCV: 77.9 fl — ABNORMAL LOW (ref 78.0–100.0)
Monocytes Absolute: 0.8 10*3/uL (ref 0.1–1.0)
Monocytes Relative: 7.9 % (ref 3.0–12.0)
Neutro Abs: 6.8 10*3/uL (ref 1.4–7.7)
Neutrophils Relative %: 70.8 % (ref 43.0–77.0)
Platelets: 285 10*3/uL (ref 150.0–400.0)
RBC: 5.11 Mil/uL (ref 3.87–5.11)
RDW: 17.8 % — ABNORMAL HIGH (ref 11.5–15.5)
WBC: 9.6 10*3/uL (ref 4.0–10.5)

## 2019-05-09 LAB — LIPID PANEL
Cholesterol: 106 mg/dL (ref 0–200)
HDL: 33.2 mg/dL — ABNORMAL LOW (ref >39.00)
LDL Cholesterol: 46 mg/dL (ref 0–99)
NonHDL: 72.95
Total CHOL/HDL Ratio: 3
Triglycerides: 135 mg/dL (ref 0.0–149.0)
VLDL: 27 mg/dL (ref 0.0–40.0)

## 2019-05-09 LAB — HEPATIC FUNCTION PANEL
ALT: 18 U/L (ref 0–35)
AST: 22 U/L (ref 0–37)
Albumin: 4.2 g/dL (ref 3.5–5.2)
Alkaline Phosphatase: 87 U/L (ref 39–117)
Bilirubin, Direct: 0.2 mg/dL (ref 0.0–0.3)
Total Bilirubin: 0.5 mg/dL (ref 0.2–1.2)
Total Protein: 7.5 g/dL (ref 6.0–8.3)

## 2019-05-09 LAB — HEMOGLOBIN A1C: Hgb A1c MFr Bld: 5.9 % (ref 4.6–6.5)

## 2019-05-09 LAB — VITAMIN D 25 HYDROXY (VIT D DEFICIENCY, FRACTURES): VITD: 34.08 ng/mL (ref 30.00–100.00)

## 2019-05-09 LAB — TSH: TSH: 2.27 u[IU]/mL (ref 0.35–4.50)

## 2019-05-27 ENCOUNTER — Other Ambulatory Visit: Payer: Self-pay | Admitting: Family Medicine

## 2019-06-05 ENCOUNTER — Other Ambulatory Visit: Payer: Self-pay | Admitting: Family Medicine

## 2019-06-06 NOTE — Telephone Encounter (Signed)
Last OV 05/08/19 Alprazolam last filled 04/17/19 #30 with 0

## 2019-06-14 ENCOUNTER — Other Ambulatory Visit: Payer: Self-pay | Admitting: Family Medicine

## 2019-07-17 ENCOUNTER — Ambulatory Visit: Payer: BC Managed Care – PPO | Admitting: Adult Health

## 2019-07-24 ENCOUNTER — Other Ambulatory Visit: Payer: Self-pay

## 2019-07-24 ENCOUNTER — Ambulatory Visit: Payer: BC Managed Care – PPO | Admitting: Adult Health

## 2019-07-24 ENCOUNTER — Encounter: Payer: Self-pay | Admitting: Adult Health

## 2019-07-24 DIAGNOSIS — J4520 Mild intermittent asthma, uncomplicated: Secondary | ICD-10-CM | POA: Diagnosis not present

## 2019-07-24 DIAGNOSIS — I5032 Chronic diastolic (congestive) heart failure: Secondary | ICD-10-CM | POA: Diagnosis not present

## 2019-07-24 DIAGNOSIS — I5030 Unspecified diastolic (congestive) heart failure: Secondary | ICD-10-CM | POA: Insufficient documentation

## 2019-07-24 NOTE — Assessment & Plan Note (Signed)
Healthy weight loss discussed in detail 

## 2019-07-24 NOTE — Assessment & Plan Note (Signed)
Mild intermittent asthma versus reactive airways significant clinical improvement on Symbicort.  Patient may use 1 to 2 puffs twice daily.  Plan  Patient Instructions  Continue on Symbicort 1- 2 puffs twice daily, rinse after use Albuterol inhaler as needed Activity as tolerated Low-salt diet. Continue on current regimen with your diuretics. Follow-up with Dr. Elsworth Soho in 6 months and as needed

## 2019-07-24 NOTE — Assessment & Plan Note (Signed)
Grade 2 diastolic dysfunction on echo-patient does have some lower extremity edema seems to be well controlled on current regimen of Lasix and Aldactone.  Patient is encouraged on a low-salt diet.  Encouraged on healthy weight loss.  Plan  Patient Instructions  Continue on Symbicort 1- 2 puffs twice daily, rinse after use Albuterol inhaler as needed Activity as tolerated Low-salt diet. Continue on current regimen with your diuretics. Follow-up with Dr. Elsworth Soho in 6 months and as needed

## 2019-07-24 NOTE — Progress Notes (Signed)
@Patient  ID: Ariana [name], female    DOB: 30-May-1960, 59 y.o.   MRN: [id number]  Chief Complaint  Patient presents with  . Follow-up    Asthma     Referring provider: Midge Minium, MD  HPI: 59 year old female former smoker followed for dyspnea with exertion since 2018 Medical history significant for diastolic dysfunction.  TEST/EVENTS :  PFT8/2019no evidence of obstruction- ratio 74, FEV1 79%, FVC 83%Mild small airway disease.   12/2017 CT chest was negative for obstructive CAD ,no lung pathology.  Echocardiogram showed EF 60-65%. LV with normal systolic function. Grade 2 diastolic dysfunction.  Chest x-ray January 2020 showed clear lungs..  07/24/2019 Follow up : Dyspnea, diastolic dysfunction, mild intermittent asthma Patient returns for a 80-month follow-up.  Patient says overall she has been doing well.  Breathing has been under good control.  She is had rare albuterol use.  She uses Symbicort most days which seems to help. She denies a flare of cough or wheezing. She remains on Aldactone 12.5 mg daily and Lasix 20 mg daily.  Says overall fluid retention has been under good control.  Weight has been steady currently 269 pounds today. 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  Says she does get winded with heavy activity especially she has to walk for long peers of time.  She says she does feel like the inhalers have really helped quite a bit.  She is able to do her light to medium housework without much difficulty is able to vacuum without dyspnea or having to stop to rest..   Allergies  Allergen Reactions  . Penicillins Hives    Immunization History  Administered Date(s) Administered  . PFIZER SARS-COV-2 Vaccination 03/12/2019, 04/02/2019  . Pneumococcal Polysaccharide-23 11/07/2018  . Tdap 08/29/2015    Past Medical History:  Diagnosis Date  . Anxiety   . Depression   . Hyperlipidemia   . Hypertension   . OA (osteoarthritis)    in Bil  knees/ gets steroid injections for pain  . Ovarian cyst    right ovary/had ablation    Tobacco History: Social History   Tobacco Use  Smoking Status Former Smoker  . Quit date: 01/18/1981  . Years since quitting: 38.5  Smokeless Tobacco Never Used  Tobacco Comment   pt states she smoker for 3 years in high school (10/28/17)   Counseling given: Not Answered Comment: pt states she smoker for 3 years in high school (10/28/17)   Outpatient Medications Prior to Visit  Medication Sig Dispense Refill  . albuterol (VENTOLIN HFA) 108 (90 Base) MCG/ACT inhaler Inhale 2 puffs into the lungs every 6 (six) hours as needed for wheezing or shortness of breath. 18 g 6  . ALPRAZolam (XANAX) 1 MG tablet TAKE 1/2 TABLET BY MOUTH TWICE A DAY AS NEEDED FOR ANXIETY 30 tablet 1  . amLODipine (NORVASC) 5 MG tablet TAKE 1 TABLET BY MOUTH EVERY DAY 90 tablet 1  . bisoprolol (ZEBETA) 5 MG tablet Take 1 tablet (5 mg total) by mouth daily. 90 tablet 2  . budesonide-formoterol (SYMBICORT) 160-4.5 MCG/ACT inhaler Inhale 2 puffs into the lungs 2 (two) times daily. 1 Inhaler 6  . buPROPion (WELLBUTRIN XL) 300 MG 24 hr tablet TAKE 1 TABLET BY MOUTH EVERY DAY 90 tablet 1  . empagliflozin (JARDIANCE) 10 MG TABS tablet Take 10 mg by mouth daily. 30 tablet 6  . ibuprofen (ADVIL,MOTRIN) 200 MG tablet Take 200 mg by mouth as needed.    Marland Kitchen omeprazole (PRILOSEC)  40 MG capsule TAKE 1 CAPSULE BY MOUTH EVERY DAY 90 capsule 1  . rosuvastatin (CRESTOR) 20 MG tablet TAKE 1 TABLET BY MOUTH EVERY DAY 90 tablet 3  . spironolactone (ALDACTONE) 25 MG tablet TAKE 0.5 TABLETS (12.5 MG TOTAL) BY MOUTH 2 (TWO) TIMES DAILY. 90 tablet 3  . furosemide (LASIX) 20 MG tablet Take 1 tablet (20 mg total) by mouth daily for 10 days. 10 tablet 0   No facility-administered medications prior to visit.     Review of Systems:   Constitutional:   No  weight loss, night sweats,  Fevers, chills,  +fatigue, or  lassitude.  HEENT:   No headaches,   Difficulty swallowing,  Tooth/dental problems, or  Sore throat,                No sneezing, itching, ear ache, nasal congestion, post nasal drip,   CV:  No chest pain,  Orthopnea, PND,+ swelling in lower extremities, anasarca, dizziness, palpitations, syncope.   GI  No heartburn, indigestion, abdominal pain, nausea, vomiting, diarrhea, change in bowel habits, loss of appetite, bloody stools.   Resp:  No excess mucus, no productive cough,  No non-productive cough,  No coughing up of blood.  No change in color of mucus.  No wheezing.  No chest wall deformity  Skin: no rash or lesions.  GU: no dysuria, change in color of urine, no urgency or frequency.  No flank pain, no hematuria   MS:  No joint pain or swelling.  No decreased range of motion.  No back pain.    Physical Exam  BP 132/70 (BP Location: Left Arm, Cuff Size: Large)   Pulse 66   Temp 98 F (36.7 C) (Oral)   Ht 5\' 3"  (1.6 m)   Wt 269 lb 3.2 oz (122.1 kg)   SpO2 98%   BMI 47.69 kg/m   GEN: A/Ox3; pleasant , NAD, BMI 47  HEENT:  [place]/AT,    NOSE-clear, THROAT-clear, no lesions, no postnasal drip or exudate noted.   NECK:  Supple w/ fair ROM; no JVD; normal carotid impulses w/o bruits; no thyromegaly or nodules palpated; no lymphadenopathy.    RESP  Clear  P & A; w/o, wheezes/ rales/ or rhonchi. no accessory muscle use, no dullness to percussion  CARD:  RRR, no m/r/g, 1+ peripheral edema, pulses intact, no cyanosis or clubbing.  GI:   Soft & nt; nml bowel sounds; no organomegaly or masses detected.   Musco: Warm bil, no deformities or joint swelling noted.   Neuro: alert, no focal deficits noted.    Skin: Warm, no lesions or rashes    Lab Results:   BNP No results found for: BNP  ProBNP    Component Value Date/Time   PROBNP 41.0 07/30/2017 1553    Imaging: No results found.    PFT Results Latest Ref Rng & Units 08/16/2017  FVC-Pre L 2.57  FVC-Predicted Pre % 83  FVC-Post L 2.58  FVC-Predicted  Post % 83  Pre FEV1/FVC % % 74  Post FEV1/FCV % % 75  FEV1-Pre L 1.90  FEV1-Predicted Pre % 79  FEV1-Post L 1.94    No results found for: NITRICOXIDE      Assessment & Plan:   Mild intermittent asthma Mild intermittent asthma versus reactive airways significant clinical improvement on Symbicort.  Patient may use 1 to 2 puffs twice daily.  Plan  Patient Instructions  Continue on Symbicort 1- 2 puffs twice daily, rinse after use Albuterol inhaler as  needed Activity as tolerated Low-salt diet. Continue on current regimen with your diuretics. Follow-up with Dr. Elsworth Soho in 6 months and as needed     Severe obesity (BMI >= 40) ([place]) Healthy weight loss discussed in detail  Diastolic heart failure ([facility]) Grade 2 diastolic dysfunction on echo-patient does have some lower extremity edema seems to be well controlled on current regimen of Lasix and Aldactone.  Patient is encouraged on a low-salt diet.  Encouraged on healthy weight loss.  Plan  Patient Instructions  Continue on Symbicort 1- 2 puffs twice daily, rinse after use Albuterol inhaler as needed Activity as tolerated Low-salt diet. Continue on current regimen with your diuretics. Follow-up with Dr. Elsworth Soho in 6 months and as needed        Rexene Edison, NP 07/24/2019

## 2019-07-24 NOTE — Patient Instructions (Addendum)
Continue on Symbicort 1- 2 puffs twice daily, rinse after use Albuterol inhaler as needed Activity as tolerated Low-salt diet. Continue on current regimen with your diuretics. Follow-up with Dr. Elsworth Soho in 6 months and as needed

## 2019-07-31 ENCOUNTER — Other Ambulatory Visit: Payer: Self-pay | Admitting: Family Medicine

## 2019-09-27 ENCOUNTER — Other Ambulatory Visit: Payer: Self-pay | Admitting: Family Medicine

## 2019-09-27 NOTE — Telephone Encounter (Signed)
LFD 06/07/19 #30 with 1 refill LOV 05/08/19 NOV none

## 2019-10-21 ENCOUNTER — Other Ambulatory Visit: Payer: Self-pay | Admitting: Cardiovascular Disease

## 2019-10-22 ENCOUNTER — Other Ambulatory Visit: Payer: Self-pay | Admitting: Cardiovascular Disease

## 2019-10-25 ENCOUNTER — Other Ambulatory Visit: Payer: Self-pay | Admitting: Family Medicine

## 2019-10-30 IMAGING — DX DG CHEST 2V
2 series · 2 of 2 positions shown · non-contrast
Comparison: None.

CLINICAL DATA: Dyspnea for 1 year

EXAM:
CHEST - 2 VIEW

[chest pa]
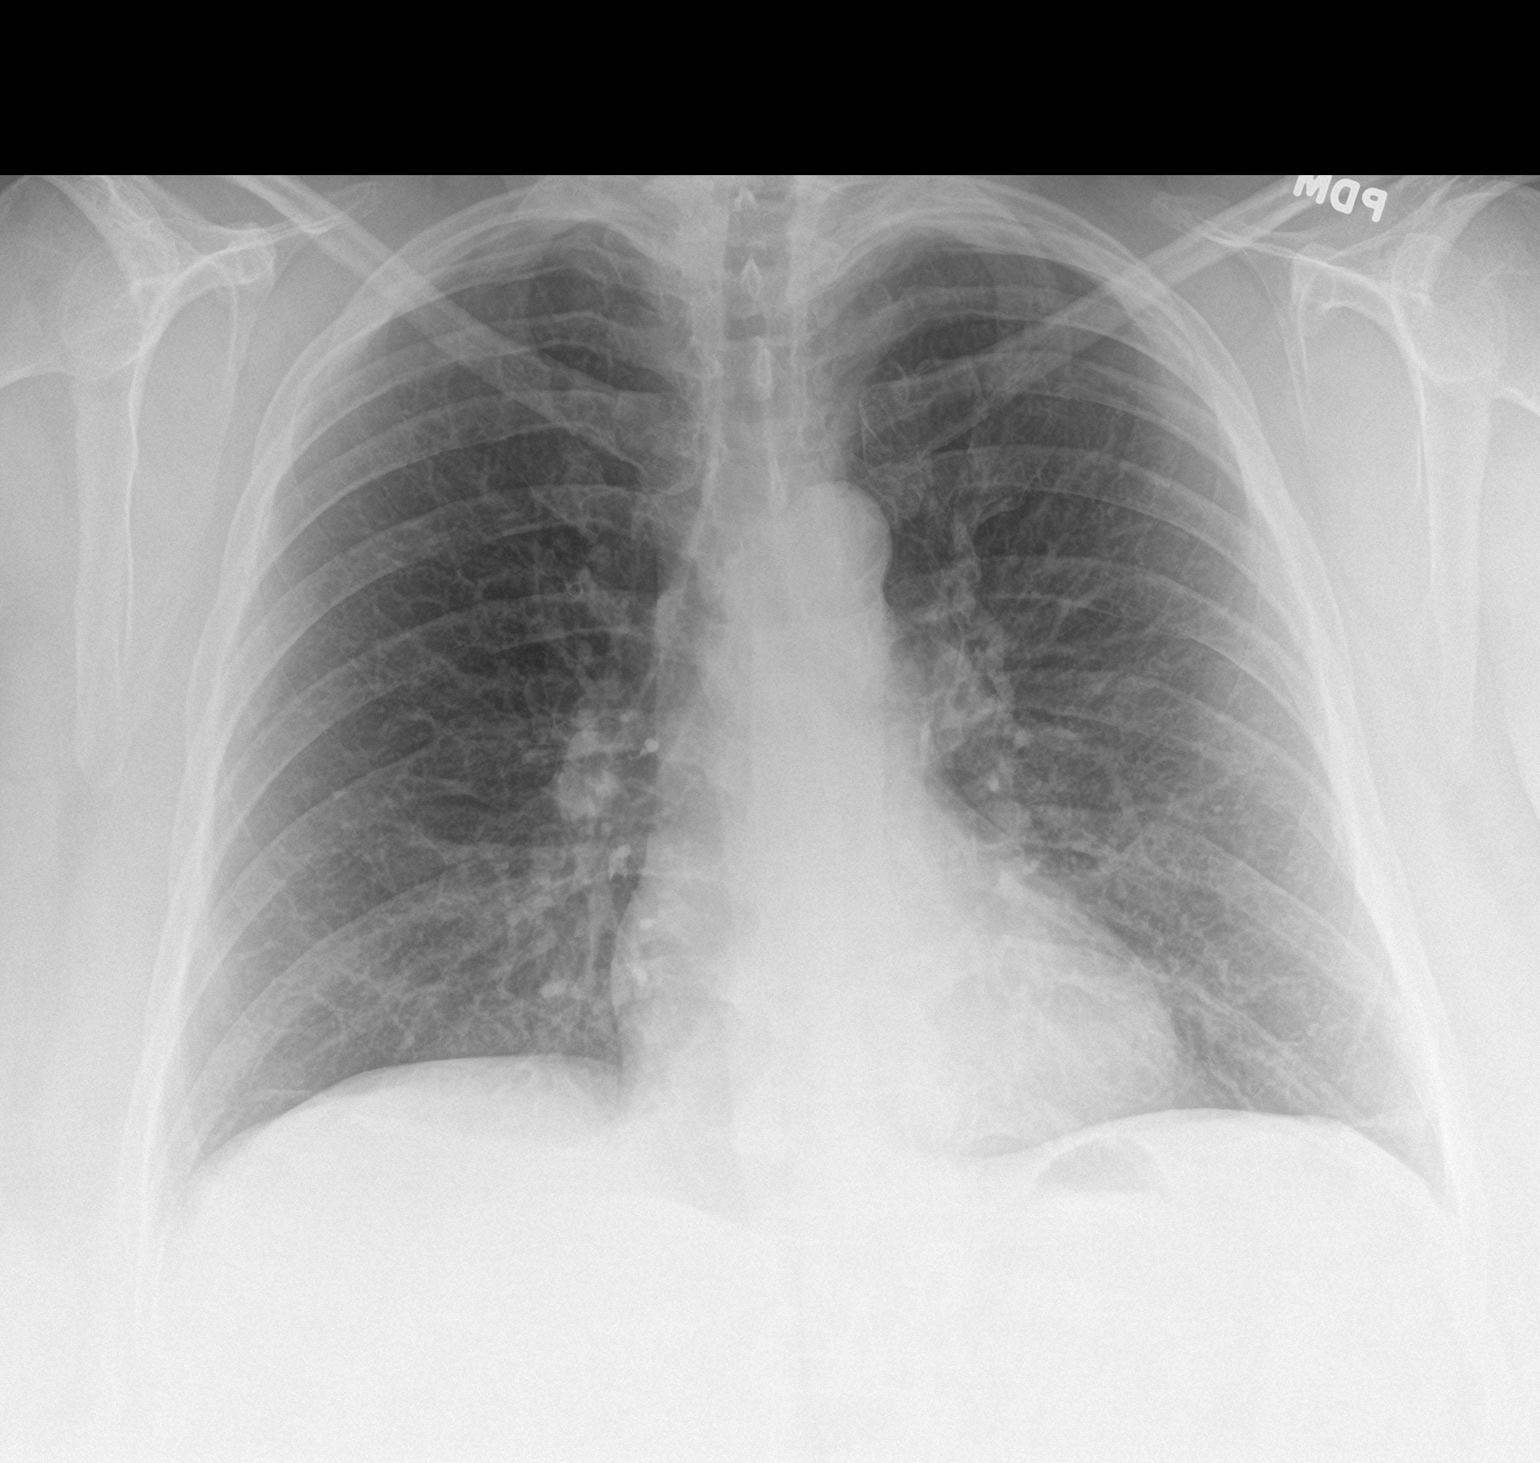

[chest lat]
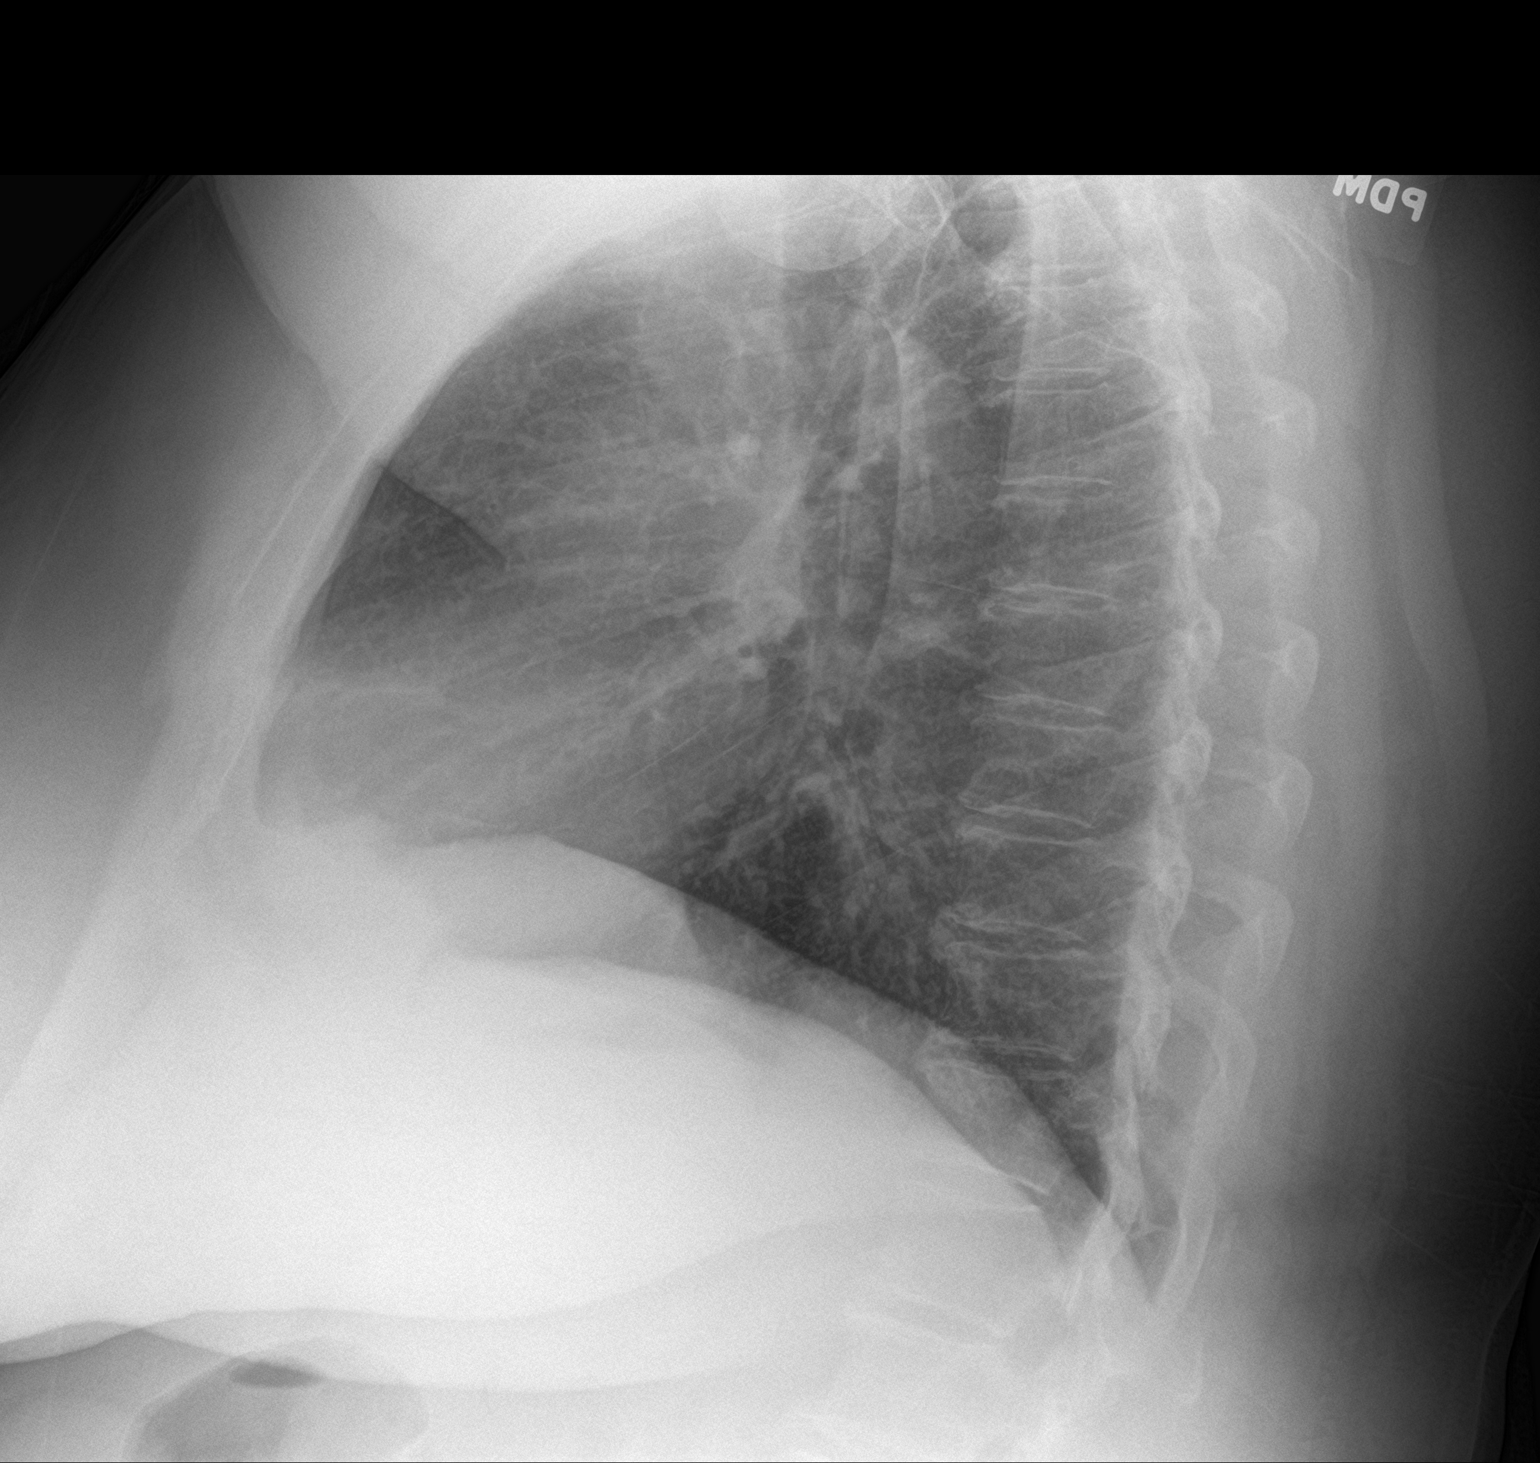

[2 of 2 positions shown; findings below may reference images not displayed]

FINDINGS: The heart size and mediastinal contours are within normal limits.
Both lungs are clear. The visualized skeletal structures are
unremarkable.
IMPRESSION: No active cardiopulmonary disease.

## 2019-11-15 ENCOUNTER — Telehealth: Payer: BC Managed Care – PPO | Admitting: Physician Assistant

## 2019-11-23 ENCOUNTER — Telehealth: Payer: BC Managed Care – PPO | Admitting: Physician Assistant

## 2019-11-28 ENCOUNTER — Telehealth (INDEPENDENT_AMBULATORY_CARE_PROVIDER_SITE_OTHER): Payer: BC Managed Care – PPO | Admitting: Physician Assistant

## 2019-11-28 ENCOUNTER — Encounter: Payer: Self-pay | Admitting: Physician Assistant

## 2019-11-28 DIAGNOSIS — I2510 Atherosclerotic heart disease of native coronary artery without angina pectoris: Secondary | ICD-10-CM

## 2019-11-28 DIAGNOSIS — R0600 Dyspnea, unspecified: Secondary | ICD-10-CM | POA: Diagnosis not present

## 2019-11-28 DIAGNOSIS — R0609 Other forms of dyspnea: Secondary | ICD-10-CM

## 2019-11-28 DIAGNOSIS — I10 Essential (primary) hypertension: Secondary | ICD-10-CM | POA: Diagnosis not present

## 2019-11-28 NOTE — Patient Instructions (Signed)
Medication Instructions:  Your physician recommends that you continue on your current medications as directed. Please refer to the Current Medication list given to you today.  *If you need a refill on your cardiac medications before your next appointment, please call your pharmacy*  Lab Work: NONE ordered at this time of appointment   If you have labs (blood work) drawn today and your tests are completely normal, you will receive your results only by: . MyChart Message (if you have MyChart) OR . A paper copy in the mail If you have any lab test that is abnormal or we need to change your treatment, we will call you to review the results.  Testing/Procedures: NONE ordered at this time of appointment   Follow-Up: At [facility], you and your health needs are our priority.  As part of our continuing mission to provide you with exceptional heart care, we have created designated Provider Care Teams.  These Care Teams include your primary Cardiologist (physician) and Advanced Practice Providers (APPs -  Physician Assistants and Nurse Practitioners) who all work together to provide you with the care you need, when you need it.  Your next appointment:   1 year(s)  The format for your next appointment:   In Person  Provider:   Thomas Kelly, MD  Other Instructions   

## 2019-11-28 NOTE — Progress Notes (Signed)
Virtual Visit via Video Note   This visit type was conducted due to national recommendations for restrictions regarding the COVID-19 Pandemic (e.g. social distancing) in an effort to limit this patient's exposure and mitigate transmission in our community.  Due to her co-morbid illnesses, this patient is at least at moderate risk for complications without adequate follow up.  This format is felt to be most appropriate for this patient at this time.  All issues noted in this document were discussed and addressed.  No physical exam could be performed with this format.  Please refer to the patient's chart for her  consent to telehealth for [facility].    Date:  11/28/2019   ID:  Ariana [name], DOB 12/02/1960, MRN [id number] The patient was identified using 2 identifiers.  Patient Location: Home Provider Location: Home Office  PCP:  Midge Minium, MD  Cardiologist:  Shelva Majestic, MD  Electrophysiologist:  None   Evaluation Performed:  Follow-Up Visit  Chief Complaint:  Follow up  History of Present Illness:    Ariana [name] is a 59 y.o. female with past medical history of obesity, hypertension, OSA, GERD and dyspnea on exertion.  Patient was referred to Dr. Claiborne Billings for evaluation of dyspnea on exertion.  Initial echocardiogram ordered by pulmonology service on 08/04/2017 showed EF 60 to 65%, grade 2 DD.  Pulmonary function test performed in August 2019 showed FEV1 79%, FVC 83%.  FEV1/FVC ratio 74%.  She has been followed by Dr. Elsworth Soho for her pulmonary care.  Coronary CT obtained on 12/07/2017 showed coronary calcium score of 3.89 which placed the patient at the 69th percentile for age and sex matched control, she had minimal plaque less than 25% in proximal to distal RCA, she also had less than 25% disease in proximal LAD, there was no significant disease anywhere else.  Overall, she had minimal disease at best.  Patient presents today for follow-up via mychart video visit. She  continued to have dyspnea on exertion, this is likely due to combination of pulmonary and diastolic heart disease. She has some chest tightness with more strenuous activity, however this is present prior to the previous coronary CT as well. This is likely related to breathing issue rather than cardiac history. Suspicion for significant CAD at this point is still low.   The patient does not have symptoms concerning for COVID-19 infection (fever, chills, cough, or new shortness of breath).    Past Medical History:  Diagnosis Date  . Anxiety   . Depression   . Hyperlipidemia   . Hypertension   . OA (osteoarthritis)    in Bil knees/ gets steroid injections for pain  . Ovarian cyst    right ovary/had ablation   Past Surgical History:  Procedure Laterality Date  . ENDOMETRIAL ABLATION  2012  . OVARIAN CYST REMOVAL Right 2012     Current Meds  Medication Sig  . albuterol (VENTOLIN HFA) 108 (90 Base) MCG/ACT inhaler Inhale 2 puffs into the lungs every 6 (six) hours as needed for wheezing or shortness of breath.  . ALPRAZolam (XANAX) 1 MG tablet TAKE 1/2 TABLET BY MOUTH TWICE A DAY AS NEEDED FOR ANXIETY  . amLODipine (NORVASC) 5 MG tablet TAKE 1 TABLET BY MOUTH EVERY DAY  . bisoprolol (ZEBETA) 5 MG tablet Take 1 tablet (5 mg total) by mouth daily.  . budesonide-formoterol (SYMBICORT) 160-4.5 MCG/ACT inhaler Inhale 2 puffs into the lungs 2 (two) times daily.  Marland Kitchen buPROPion (WELLBUTRIN XL) 300 MG 24  hr tablet TAKE 1 TABLET BY MOUTH EVERY DAY  . ibuprofen (ADVIL,MOTRIN) 200 MG tablet Take 200 mg by mouth as needed.  Marland Kitchen JARDIANCE 10 MG TABS tablet TAKE 1 TABLET BY MOUTH DAILY  . omeprazole (PRILOSEC) 40 MG capsule TAKE 1 CAPSULE BY MOUTH EVERY DAY  . rosuvastatin (CRESTOR) 20 MG tablet TAKE 1 TABLET BY MOUTH EVERY DAY  . spironolactone (ALDACTONE) 25 MG tablet TAKE 0.5 TABLETS (12.5 MG TOTAL) BY MOUTH 2 (TWO) TIMES DAILY.     Allergies:   Penicillins   Social History   Tobacco Use  .  Smoking status: Former Smoker    Quit date: 01/18/1981    Years since quitting: 38.8  . Smokeless tobacco: Never Used  . Tobacco comment: pt states she smoker for 3 years in high school (10/28/17)  Vaping Use  . Vaping Use: Never used  Substance Use Topics  . Alcohol use: No  . Drug use: No     Family Hx: The patient's family history includes Arthritis in her brother and father; Cancer in her maternal aunt; Dementia in her mother; Diabetes in her brother; Heart attack in her father; Heart disease in her brother and father; Hyperlipidemia in her father; Hypertension in her father; Liver disease in her brother.  ROS:   Please see the history of present illness.     All other systems reviewed and are negative.   Prior CV studies:   The following studies were reviewed today:  Echo 08/04/2017 LV EF: 60% -  65%  Study Conclusions   - Left ventricle: The cavity size was normal. Wall thickness was  normal. Systolic function was normal. The estimated ejection  fraction was in the range of 60% to 65%. Wall motion was normal;  there were no regional wall motion abnormalities. Features are  consistent with a pseudonormal left ventricular filling pattern,  with concomitant abnormal relaxation and increased filling  pressure (grade 2 diastolic dysfunction).    Coronary CT 12/07/2017 IMPRESSION: 1. Coronary calcium score of 3.89 This was 69th percentile for age and sex matched control.  2. Normal coronary origin with right dominance.  3. No evidence of obstructive CAD.  Labs/Other Tests and Data Reviewed:    EKG:  An ECG dated 01/23/2019 was personally reviewed today and demonstrated:  Sinus rhythm without significant ST-T wave changes.  Recent Labs: 05/08/2019: ALT 18; BUN 17; Creatinine, Ser 1.10; Hemoglobin 12.7; Platelets 285.0; Potassium 3.8; Sodium 138; TSH 2.27   Recent Lipid Panel Lab Results  Component Value Date/Time   CHOL 106 05/08/2019 04:13 PM   TRIG  135.0 05/08/2019 04:13 PM   HDL 33.20 (L) 05/08/2019 04:13 PM   CHOLHDL 3 05/08/2019 04:13 PM   LDLCALC 46 05/08/2019 04:13 PM   LDLDIRECT 99.0 08/29/2015 02:17 PM    Wt Readings from Last 3 Encounters:  07/24/19 269 lb 3.2 oz (122.1 kg)  05/08/19 268 lb 8 oz (121.8 kg)  01/23/19 267 lb 9.6 oz (121.4 kg)     Risk Assessment/Calculations:      Objective:    Vital Signs:  There were no vitals taken for this visit.   VITAL SIGNS:  reviewed  ASSESSMENT & PLAN:    1. Dyspnea on exertion: This is chronic for the patient. She does experience some chest tightness with heavy exertion, this also occurred back in 2019 as well prior to the previous coronary CT. No further work-up at this time  2. CAD: Previous coronary CT obtained in 2019 showed minimal CAD  3. Hypertension: Blood pressure stable   Shared Decision Making/Informed Consent        COVID-19 Education: The signs and symptoms of COVID-19 were discussed with the patient and how to seek care for testing (follow up with PCP or arrange E-visit).  The importance of social distancing was discussed today.  Time:   Today, I have spent 6 minutes with the patient with telehealth technology discussing the above problems.     Medication Adjustments/Labs and Tests Ordered: Current medicines are reviewed at length with the patient today.  Concerns regarding medicines are outlined above.   Tests Ordered: No orders of the defined types were placed in this encounter.   Medication Changes: No orders of the defined types were placed in this encounter.   Follow Up:  In Person in 1 year(s)  Signed, Almyra Deforest, [place]  11/28/2019 3:58 PM    [facility]

## 2019-12-05 ENCOUNTER — Other Ambulatory Visit: Payer: Self-pay | Admitting: Family Medicine

## 2019-12-10 ENCOUNTER — Other Ambulatory Visit: Payer: Self-pay | Admitting: Cardiovascular Disease

## 2019-12-11 ENCOUNTER — Other Ambulatory Visit: Payer: Self-pay | Admitting: Family Medicine

## 2019-12-20 ENCOUNTER — Other Ambulatory Visit: Payer: Self-pay | Admitting: Cardiovascular Disease

## 2020-01-04 ENCOUNTER — Other Ambulatory Visit: Payer: Self-pay | Admitting: Family Medicine

## 2020-01-07 ENCOUNTER — Other Ambulatory Visit: Payer: Self-pay | Admitting: Family Medicine

## 2020-01-07 DIAGNOSIS — R609 Edema, unspecified: Secondary | ICD-10-CM

## 2020-01-08 ENCOUNTER — Other Ambulatory Visit: Payer: Self-pay | Admitting: Family Medicine

## 2020-01-30 ENCOUNTER — Encounter: Payer: Self-pay | Admitting: Family Medicine

## 2020-01-30 ENCOUNTER — Ambulatory Visit (INDEPENDENT_AMBULATORY_CARE_PROVIDER_SITE_OTHER): Payer: BC Managed Care – PPO | Admitting: Family Medicine

## 2020-01-30 ENCOUNTER — Other Ambulatory Visit: Payer: Self-pay

## 2020-01-30 VITALS — BP 122/74 | HR 62 | Temp 98.5°F | Resp 18 | Ht 63.0 in | Wt 263.0 lb

## 2020-01-30 DIAGNOSIS — E785 Hyperlipidemia, unspecified: Secondary | ICD-10-CM

## 2020-01-30 DIAGNOSIS — E119 Type 2 diabetes mellitus without complications: Secondary | ICD-10-CM | POA: Diagnosis not present

## 2020-01-30 DIAGNOSIS — I10 Essential (primary) hypertension: Secondary | ICD-10-CM | POA: Diagnosis not present

## 2020-01-30 DIAGNOSIS — F32A Depression, unspecified: Secondary | ICD-10-CM

## 2020-01-30 DIAGNOSIS — F419 Anxiety disorder, unspecified: Secondary | ICD-10-CM

## 2020-01-30 LAB — LIPID PANEL
Cholesterol: 134 mg/dL (ref 0–200)
HDL: 30.8 mg/dL — ABNORMAL LOW (ref >39.00)
NonHDL: 103.14
Total CHOL/HDL Ratio: 4
Triglycerides: 248 mg/dL — ABNORMAL HIGH (ref 0.0–149.0)
VLDL: 49.6 mg/dL — ABNORMAL HIGH (ref 0.0–40.0)

## 2020-01-30 LAB — HEPATIC FUNCTION PANEL
ALT: 22 U/L (ref 0–35)
AST: 28 U/L (ref 0–37)
Albumin: 4.3 g/dL (ref 3.5–5.2)
Alkaline Phosphatase: 98 U/L (ref 39–117)
Bilirubin, Direct: 0.1 mg/dL (ref 0.0–0.3)
Total Bilirubin: 0.4 mg/dL (ref 0.2–1.2)
Total Protein: 7.7 g/dL (ref 6.0–8.3)

## 2020-01-30 LAB — CBC WITH DIFFERENTIAL/PLATELET
Basophils Absolute: 0 10*3/uL (ref 0.0–0.1)
Basophils Relative: 0.2 % (ref 0.0–3.0)
Eosinophils Absolute: 0 10*3/uL (ref 0.0–0.7)
Eosinophils Relative: 0 % (ref 0.0–5.0)
HCT: 38.6 % (ref 36.0–46.0)
Hemoglobin: 12.7 g/dL (ref 12.0–15.0)
Lymphocytes Relative: 20.6 % (ref 12.0–46.0)
Lymphs Abs: 1.7 10*3/uL (ref 0.7–4.0)
MCHC: 32.9 g/dL (ref 30.0–36.0)
MCV: 76.5 fl — ABNORMAL LOW (ref 78.0–100.0)
Monocytes Absolute: 0.6 10*3/uL (ref 0.1–1.0)
Monocytes Relative: 7.2 % (ref 3.0–12.0)
Neutro Abs: 5.9 10*3/uL (ref 1.4–7.7)
Neutrophils Relative %: 72 % (ref 43.0–77.0)
Platelets: 293 10*3/uL (ref 150.0–400.0)
RBC: 5.05 Mil/uL (ref 3.87–5.11)
RDW: 16.9 % — ABNORMAL HIGH (ref 11.5–15.5)
WBC: 8.2 10*3/uL (ref 4.0–10.5)

## 2020-01-30 LAB — BASIC METABOLIC PANEL
BUN: 20 mg/dL (ref 6–23)
CO2: 26 mEq/L (ref 19–32)
Calcium: 9.5 mg/dL (ref 8.4–10.5)
Chloride: 102 mEq/L (ref 96–112)
Creatinine, Ser: 1.02 mg/dL (ref 0.40–1.20)
GFR: 59.44 mL/min — ABNORMAL LOW (ref >60.00)
Glucose, Bld: 82 mg/dL (ref 70–99)
Potassium: 3.9 mEq/L (ref 3.5–5.1)
Sodium: 137 mEq/L (ref 135–145)

## 2020-01-30 LAB — HEMOGLOBIN A1C: Hgb A1c MFr Bld: 5.9 % (ref 4.6–6.5)

## 2020-01-30 LAB — TSH: TSH: 1.52 u[IU]/mL (ref 0.35–4.50)

## 2020-01-30 LAB — MICROALBUMIN / CREATININE URINE RATIO
Creatinine,U: 201.3 mg/dL
Microalb Creat Ratio: 1.1 mg/g (ref 0.0–30.0)
Microalb, Ur: 2.2 mg/dL — ABNORMAL HIGH (ref 0.0–1.9)

## 2020-01-30 LAB — LDL CHOLESTEROL, DIRECT: Direct LDL: 76 mg/dL

## 2020-01-30 NOTE — Progress Notes (Signed)
   Subjective:    Patient ID: Ariana [name], female    DOB: Nov 12, 1960, 60 y.o.   MRN: [id number]  HPI DM- chronic problem, on Jardiance 10mg  daily.  Due for eye exam.  Due for foot exam and microalbumin.  Last A1C 5.9%  No visual changes.  Rare symptomatic lows.  No numbness/tingling of hands/feet  HTN- chronic problem, on Amlodipine 5mg  daily, Bisoprolol 5mg  daily, Lasix 20mg  daily, Spironolactone 25mg  w/ good control.  Denies CP, SOB above baseline.  + LE edema- more recently and occurs after being up on feet all day.  Hyperlipidemia- chronic problem, Crestor 20mg  daily.  Denies abd pain, N/V  Obesity- pt is down 5 lbs since last visit  Anxiety/depression- chronic problem, on Wellbutrin 300mg  daily w/ good control   Review of Systems For ROS see HPI   This visit occurred during the SARS-CoV-2 public health emergency.  Safety protocols were in place, including screening questions prior to the visit, additional usage of staff PPE, and extensive cleaning of exam room while observing appropriate contact time as indicated for disinfecting solutions.       Objective:   Physical Exam Vitals reviewed.  Constitutional:      General: She is not in acute distress.    Appearance: Normal appearance. She is well-developed and well-nourished. She is obese.  HENT:     Head: Normocephalic and atraumatic.  Eyes:     Extraocular Movements: EOM normal.     Conjunctiva/sclera: Conjunctivae normal.     Pupils: Pupils are equal, round, and reactive to light.  Neck:     Thyroid: No thyromegaly.  Cardiovascular:     Rate and Rhythm: Normal rate and regular rhythm.     Pulses: Intact distal pulses.     Heart sounds: Normal heart sounds. No murmur heard.   Pulmonary:     Effort: Pulmonary effort is normal. No respiratory distress.     Breath sounds: Normal breath sounds.  Abdominal:     General: There is no distension.     Palpations: Abdomen is soft.     Tenderness: There is no abdominal  tenderness.  Musculoskeletal:        General: No edema.     Cervical back: Normal range of motion and neck supple.  Lymphadenopathy:     Cervical: No cervical adenopathy.  Skin:    General: Skin is warm and dry.  Neurological:     Mental Status: She is alert and oriented to person, place, and time.  Psychiatric:        Mood and Affect: Mood and affect and mood normal.        Behavior: Behavior normal.           Assessment & Plan:

## 2020-01-30 NOTE — Patient Instructions (Addendum)
Schedule your complete physical in 6 months We'll notify you of your lab results and make any changes if needed Continue to work on healthy diet and regular exercise- you can do it!! Call and schedule your eye exam Call [facility] to schedule your mammogram Call with any questions or concerns Stay Safe!  Stay Healthy!!

## 2020-02-01 ENCOUNTER — Other Ambulatory Visit: Payer: Self-pay | Admitting: Family Medicine

## 2020-02-04 NOTE — Assessment & Plan Note (Signed)
Chronic problem.  On Amlodipine 5mg  daily, Bisoprolol 5mg  daily, Lasix 20mg  daily, and Spironolactone 25mg  daily w/ good control.  Currently asymptomatic w/ exception of LE edema after being on feet all day.  Encouraged low sodium diet, compression socks, and elevation.  Check labs.  No anticipated med changes.

## 2020-02-04 NOTE — Assessment & Plan Note (Signed)
Chronic problem.  Due for eye exam- pt to schedule.  Microalbumin ordered.  Foot exam done.  Has hx of excellent control on Jardiance 10mg  daily.  Check labs.  Adjust meds prn

## 2020-02-04 NOTE — Assessment & Plan Note (Signed)
Pt is down 5 lbs since last visit.  Encouraged her to continue in this direction by focusing on healthy diet and regular exercise.  Will continue to follow.

## 2020-02-04 NOTE — Assessment & Plan Note (Signed)
Chronic problem.  Tolerating Crestor 20mg daily w/o difficulty.  Check labs.  Adjust meds prn  

## 2020-02-04 NOTE — Assessment & Plan Note (Signed)
Chronic problem.  Pt feels sxs are currently well controlled on Wellbutrin 300mg  daily.  No med changes at this time.  Will follow.

## 2020-02-19 ENCOUNTER — Other Ambulatory Visit: Payer: Self-pay | Admitting: Cardiovascular Disease

## 2020-03-04 ENCOUNTER — Other Ambulatory Visit: Payer: Self-pay | Admitting: Family Medicine

## 2020-03-08 IMAGING — CT CT HEART MORP W/ CTA COR W/ SCORE W/ CA W/CM &/OR W/O CM
4 of 7 series · 8 of 20 positions shown, 9 images · non-contrast
Comparison: None.

Addendum:
EXAM:
OVER-READ INTERPRETATION  CT CHEST

The following report is an over-read performed by radiologist Dr.
Marynika Hauber [REDACTED] on 12/07/2017. This
over-read does not include interpretation of cardiac or coronary
anatomy or pathology. The coronary calcium score/coronary CTA
interpretation by the cardiologist is attached.
CLINICAL DATA: 57F with hypertension, chronic diastolic heart
failure, GERD and morbid obesity with shortness of breath.
Cardiac/Coronary  CT
TECHNIQUE: The patient was scanned on a Phillips Force scanner.

[Series 6: best diast 70 % · axial · 0.39mm/px · z∈[-123,-76]mm · 2 of 358 slices shown, 3 images]
[im 120/358  vessel]
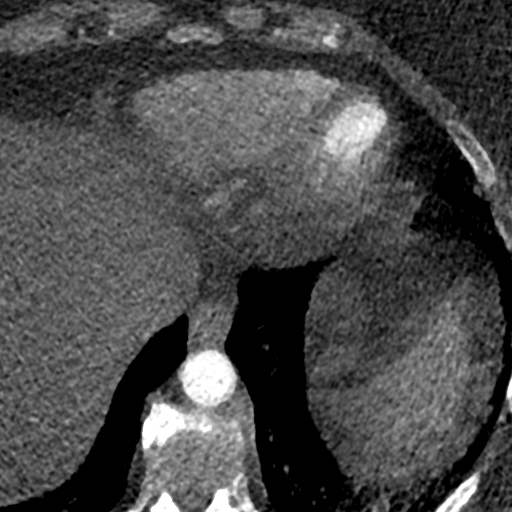
[im 120/358  lung]
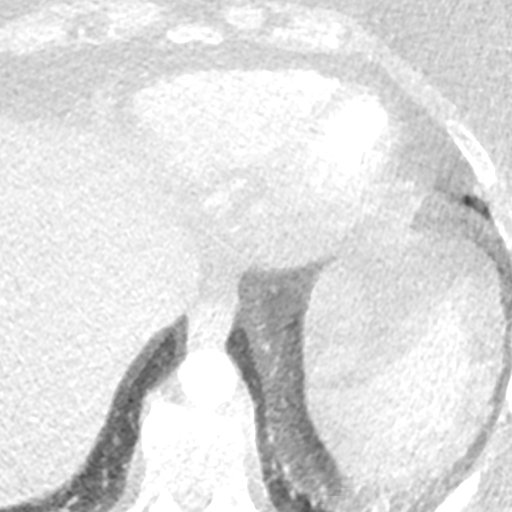
[im 239/358  vessel]
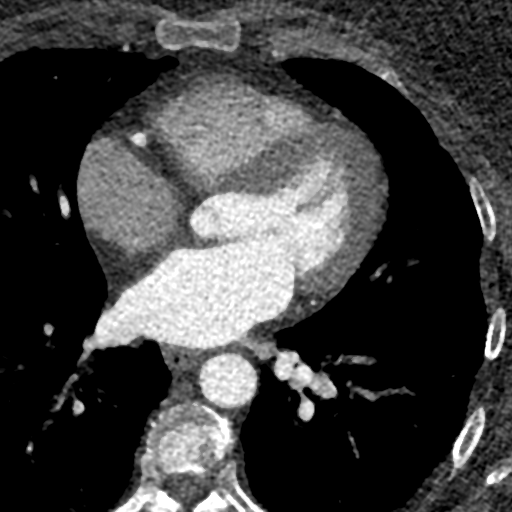

[Series 7: best syst 37 % · axial · 0.39mm/px · z∈[-123,-76]mm · 2 of 358 slices shown]
[im 120/358  vessel]
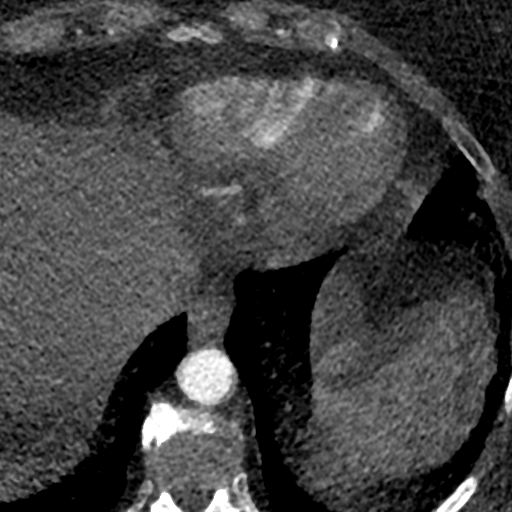
[im 239/358  vessel]
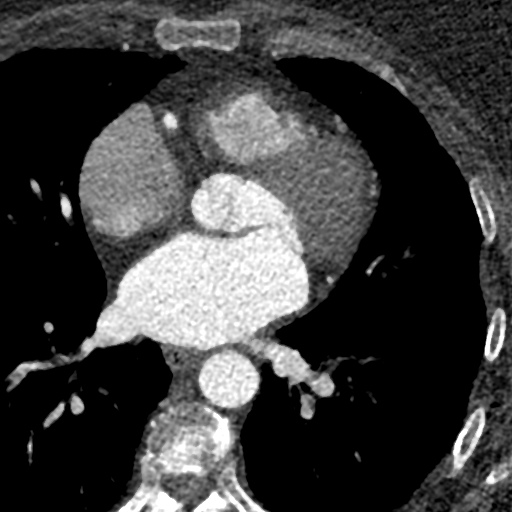

[Series 8: ts diast sharp 70 % · axial · 0.39mm/px · z∈[-123,-76]mm · 2 of 358 slices shown]
[im 120/358  lung]
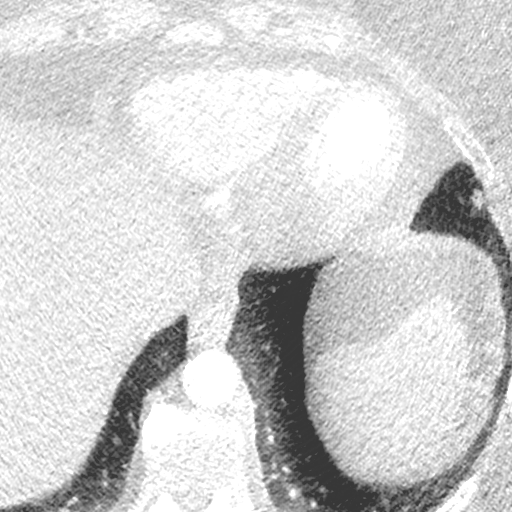
[im 239/358  lung]
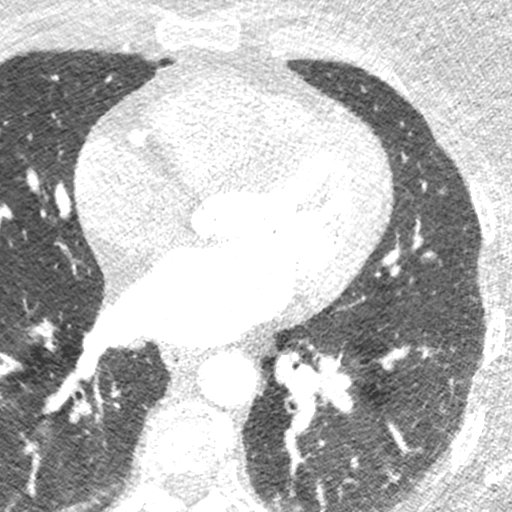

[Series 9: ts syst sharp 37 % · axial · 0.39mm/px · z∈[-123,-76]mm · 2 of 358 slices shown]
[im 120/358  lung]
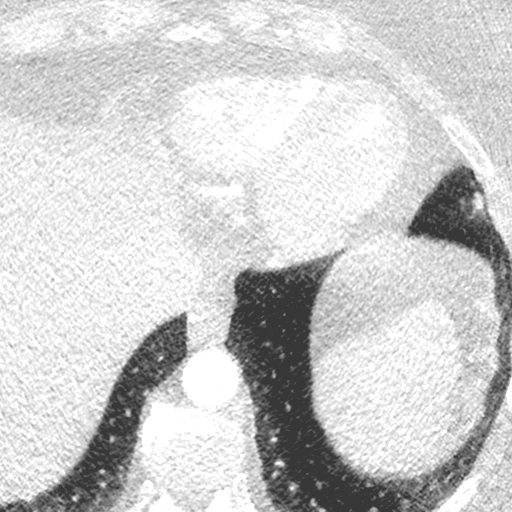
[im 239/358  lung]
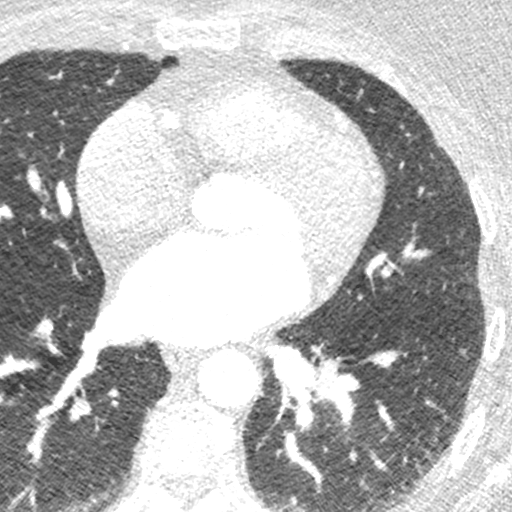

[8 of 20 positions shown; findings below may reference images not displayed]

FINDINGS: Aortic atherosclerosis. Within the visualized portions of the thorax
there are no suspicious appearing pulmonary nodules or masses, there
is no acute consolidative airspace disease, no pleural effusions, no
pneumothorax and no lymphadenopathy. Visualized portions of the
upper abdomen are unremarkable. There are no aggressive appearing
lytic or blastic lesions noted in the visualized portions of the
skeleton.
IMPRESSION: 1.  Aortic Atherosclerosis ([id number]-[id number].Z).
FINDINGS: A 120 kV prospective scan was triggered in the descending thoracic
aorta at 111 HU's. Axial non-contrast 3 mm slices were carried out
through the heart. The data set was analyzed on a dedicated work
station and scored using the Agatson method. Gantry rotation speed
was 250 msecs and collimation was .6 mm. No beta blockade and 0.8 mg
of sl NTG was given. The 3D data set was reconstructed in 5%
intervals of the 67-82 % of the R-R cycle. Diastolic phases were
analyzed on a dedicated work station using MPR, MIP and VRT modes.
The patient received 80 cc of contrast.

Aorta: Normal size. Ascending aorta 2.7cm. Mild calcification of the
aortic root. No dissection.

Aortic Valve:  Trileaflet.  No calcifications.

Coronary Arteries:  Normal coronary origin.  Right dominance.

RCA is a large dominant artery that gives rise to PDA and PLVB.
There is minimal (1-24%) plaque in the proximal and distal RCA.

Left main is a large artery that gives rise to LAD and LCX arteries.

LAD is a large vessel that has minimal (1-24%) soft attenuation
plaque proximally. There is a large branching D1 that has no plaque.

LCX is a non-dominant artery that gives rise to one large OM1
branch. There is no plaque.

Other findings:

Normal pulmonary vein drainage into the left atrium.

Normal let atrial appendage without a thrombus.

Normal size of the pulmonary artery.

Likely small PFO.
IMPRESSION: 1. Coronary calcium score of 3.89 This was 69th percentile for age
and sex matched control.

2. Normal coronary origin with right dominance.

3. No evidence of obstructive CAD.

*** End of Addendum ***

## 2020-04-14 IMAGING — DX DG CHEST 2V
2 series · 2 of 2 positions shown · non-contrast
Comparison: Radiographs July 30, 2017.

CLINICAL DATA: Shortness of breath, wheezing.

EXAM:
CHEST - 2 VIEW

[chest pa]
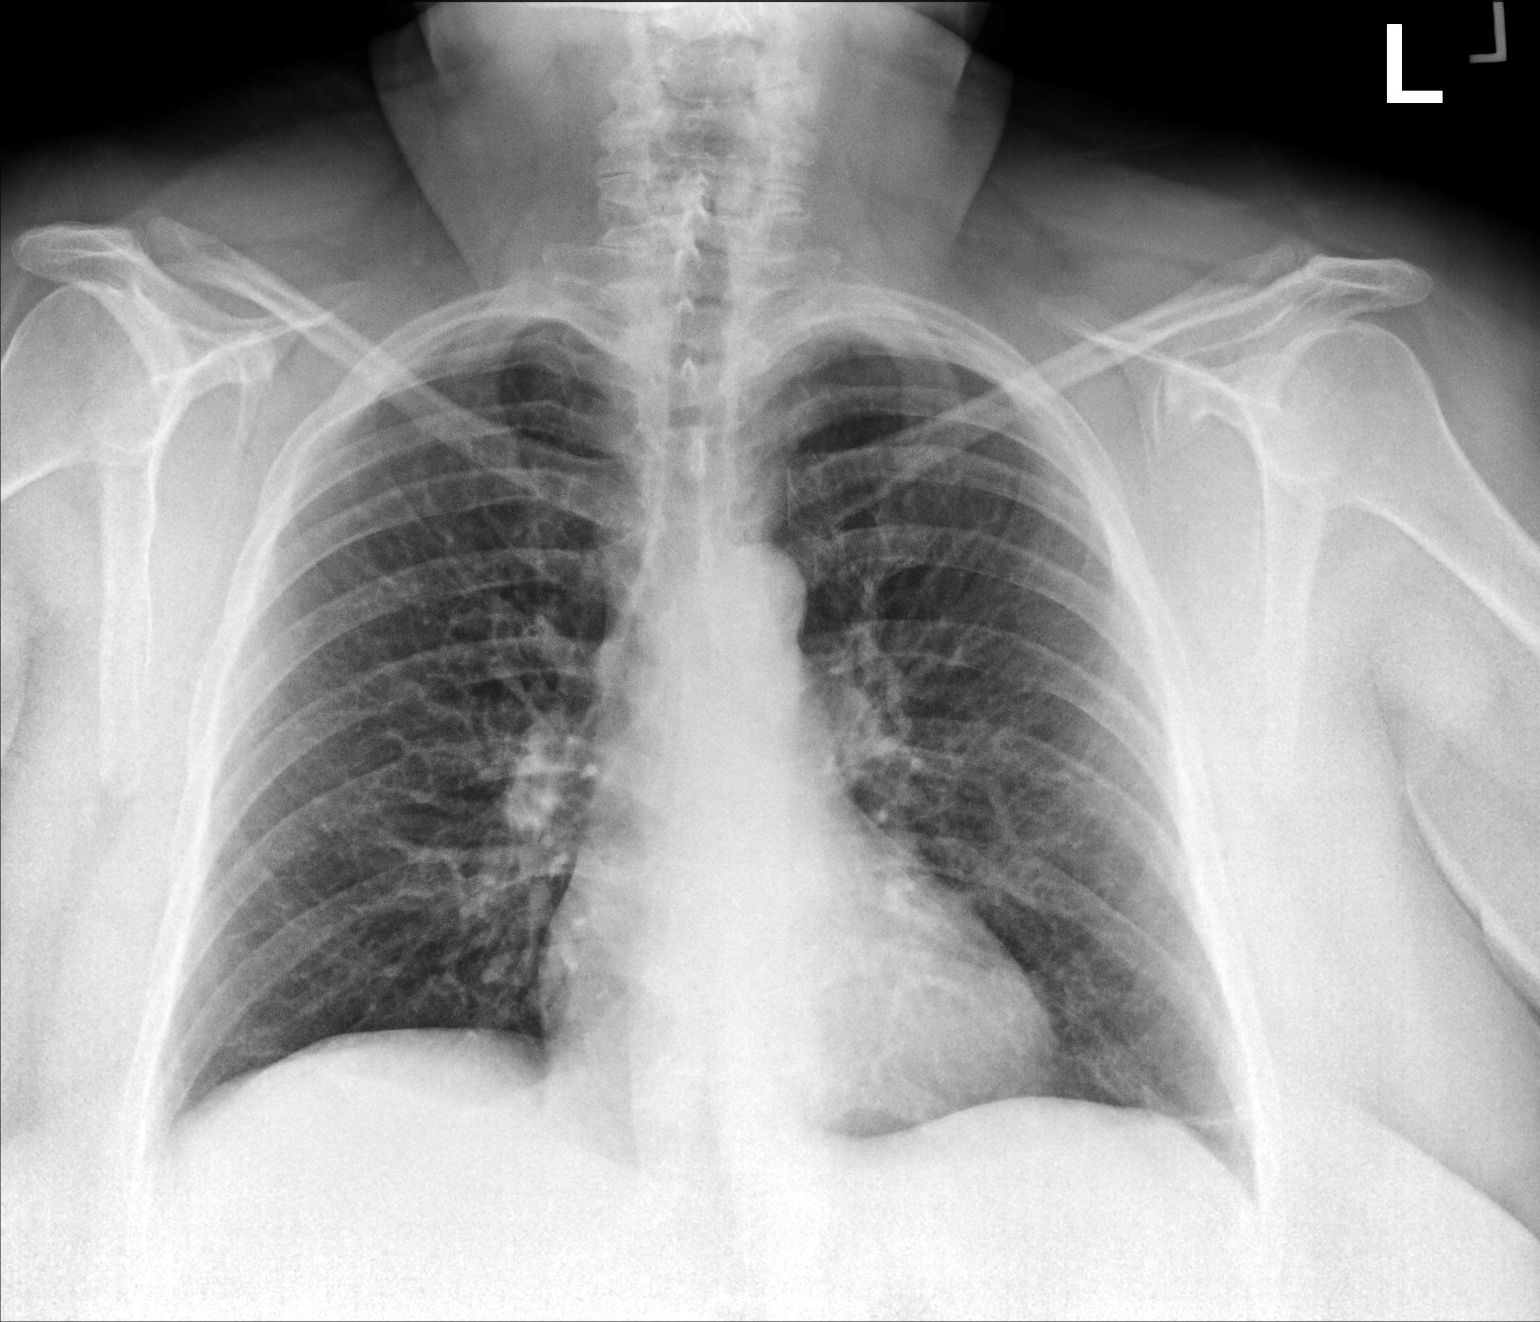

[chest lat]
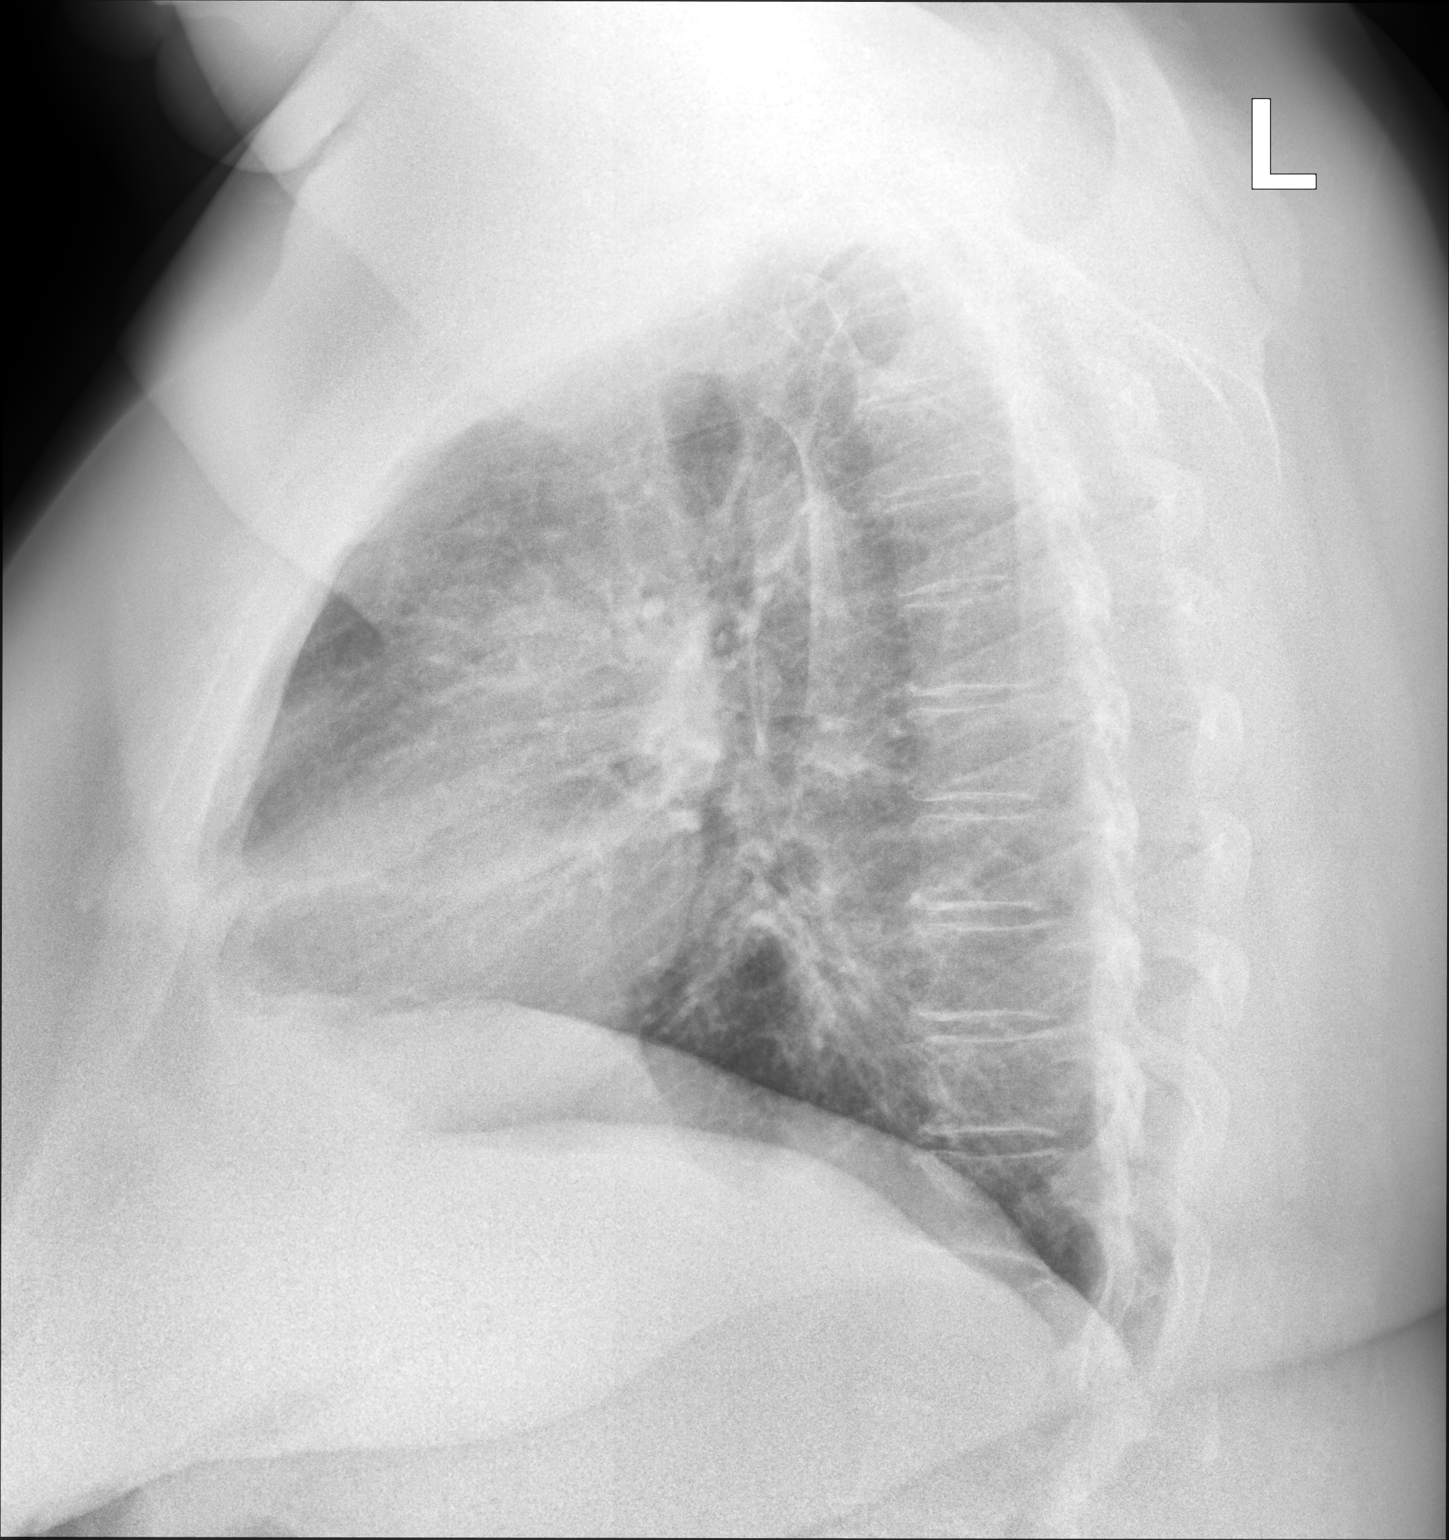

[2 of 2 positions shown; findings below may reference images not displayed]

FINDINGS: The heart size and mediastinal contours are within normal limits.
Both lungs are clear. No pneumothorax or pleural effusion is noted.
The visualized skeletal structures are unremarkable.
IMPRESSION: No active cardiopulmonary disease.

## 2020-04-15 ENCOUNTER — Other Ambulatory Visit: Payer: Self-pay | Admitting: Cardiovascular Disease

## 2020-04-16 ENCOUNTER — Other Ambulatory Visit: Payer: Self-pay | Admitting: Family Medicine

## 2020-05-02 ENCOUNTER — Other Ambulatory Visit: Payer: Self-pay | Admitting: Family Medicine

## 2020-05-03 ENCOUNTER — Other Ambulatory Visit: Payer: Self-pay | Admitting: Family Medicine

## 2020-05-03 NOTE — Telephone Encounter (Signed)
Requesting:Xanax 1mg  Contract: UDS: Last Visit:01/30/20 Next Visit:08/07/20 Last Refill:01/08/20 30 tabs 1 refill  Please Advise

## 2020-05-18 ENCOUNTER — Other Ambulatory Visit: Payer: Self-pay | Admitting: Cardiovascular Disease

## 2020-06-04 ENCOUNTER — Other Ambulatory Visit: Payer: Self-pay | Admitting: Family Medicine

## 2020-06-08 ENCOUNTER — Other Ambulatory Visit: Payer: Self-pay | Admitting: Cardiovascular Disease

## 2020-07-03 ENCOUNTER — Encounter: Payer: Self-pay | Admitting: *Deleted

## 2020-07-25 ENCOUNTER — Other Ambulatory Visit: Payer: Self-pay | Admitting: Family Medicine

## 2020-07-26 NOTE — Telephone Encounter (Signed)
LAST APPOINTMENT DATE: 06/04/2020   NEXT APPOINTMENT DATE: Visit date not found    LAST REFILL: 05/03/2020  QTY: 30     Ref 1

## 2020-07-26 NOTE — Telephone Encounter (Signed)
Patient is requesting a refill of the following medications: Requested Prescriptions   Pending Prescriptions Disp Refills   ALPRAZolam (XANAX) 1 MG tablet [Pharmacy Med Name: ALPRAZOLAM 1 MG TABLET] 30 tablet 1    Sig: TAKE 1/2 TABLET BY MOUTH TWICE A DAY AS NEEDED FOR ANXIETY    Date of patient request: 07/25/20 Birdie Riddle pt Last office visit: 01/30/20 Date of last refill: 05/03/20 Last refill amount: 30 x1 R Follow up time period per chart: 6 months for physical

## 2020-07-29 ENCOUNTER — Other Ambulatory Visit: Payer: Self-pay | Admitting: Family

## 2020-07-30 ENCOUNTER — Other Ambulatory Visit: Payer: Self-pay | Admitting: Family Medicine

## 2020-07-31 ENCOUNTER — Encounter: Payer: BC Managed Care – PPO | Admitting: Family Medicine

## 2020-08-07 ENCOUNTER — Encounter: Payer: BC Managed Care – PPO | Admitting: Family Medicine

## 2020-08-25 ENCOUNTER — Other Ambulatory Visit: Payer: Self-pay | Admitting: Family Medicine

## 2020-09-03 ENCOUNTER — Other Ambulatory Visit: Payer: Self-pay | Admitting: Cardiovascular Disease

## 2020-09-03 ENCOUNTER — Other Ambulatory Visit: Payer: Self-pay | Admitting: Family Medicine

## 2020-09-04 ENCOUNTER — Other Ambulatory Visit: Payer: Self-pay

## 2020-09-04 MED ORDER — BISOPROLOL FUMARATE 5 MG PO TABS: 5.0000 mg | ORAL_TABLET | Freq: Every day | ORAL | 1 refills | Status: DC

## 2020-09-07 ENCOUNTER — Other Ambulatory Visit: Payer: Self-pay | Admitting: Family Medicine

## 2020-09-23 ENCOUNTER — Other Ambulatory Visit: Payer: Self-pay | Admitting: Pulmonary Disease

## 2020-10-03 ENCOUNTER — Other Ambulatory Visit: Payer: Self-pay | Admitting: Family Medicine

## 2020-10-04 ENCOUNTER — Other Ambulatory Visit: Payer: Self-pay | Admitting: Family Medicine

## 2020-10-05 ENCOUNTER — Other Ambulatory Visit: Payer: Self-pay | Admitting: Family Medicine

## 2020-10-20 ENCOUNTER — Other Ambulatory Visit: Payer: Self-pay | Admitting: Registered Nurse

## 2020-10-21 NOTE — Telephone Encounter (Signed)
Patient is requesting a refill of the following medications: Requested Prescriptions   Pending Prescriptions Disp Refills   ALPRAZolam (XANAX) 1 MG tablet [Pharmacy Med Name: ALPRAZOLAM 1 MG TABLET] 30 tablet 1    Sig: TAKE 1/2 TABLET BY MOUTH TWICE A DAY AS NEEDED FOR ANXIETY    Date of patient request: 10/20/2020 Last office visit: 01/30/2020 Date of last refill: 07/26/2020 Last refill amount: 30 tablets 1 refill Follow up time period per chart: 11/21/2020

## 2020-11-02 ENCOUNTER — Other Ambulatory Visit: Payer: Self-pay | Admitting: Family Medicine

## 2020-11-21 ENCOUNTER — Encounter: Payer: Self-pay | Admitting: Family Medicine

## 2020-11-21 ENCOUNTER — Ambulatory Visit (INDEPENDENT_AMBULATORY_CARE_PROVIDER_SITE_OTHER): Payer: BC Managed Care – PPO | Admitting: Family Medicine

## 2020-11-21 VITALS — BP 116/70 | HR 63 | Temp 98.4°F | Resp 16 | Ht 63.0 in | Wt 252.4 lb

## 2020-11-21 DIAGNOSIS — Z0000 Encounter for general adult medical examination without abnormal findings: Secondary | ICD-10-CM

## 2020-11-21 DIAGNOSIS — E559 Vitamin D deficiency, unspecified: Secondary | ICD-10-CM

## 2020-11-21 DIAGNOSIS — E119 Type 2 diabetes mellitus without complications: Secondary | ICD-10-CM | POA: Diagnosis not present

## 2020-11-21 DIAGNOSIS — M170 Bilateral primary osteoarthritis of knee: Secondary | ICD-10-CM

## 2020-11-21 LAB — HEPATIC FUNCTION PANEL
ALT: 17 U/L (ref 0–35)
AST: 20 U/L (ref 0–37)
Albumin: 4.1 g/dL (ref 3.5–5.2)
Alkaline Phosphatase: 80 U/L (ref 39–117)
Bilirubin, Direct: 0.2 mg/dL (ref 0.0–0.3)
Total Bilirubin: 0.5 mg/dL (ref 0.2–1.2)
Total Protein: 7 g/dL (ref 6.0–8.3)

## 2020-11-21 LAB — CBC WITH DIFFERENTIAL/PLATELET
Basophils Absolute: 0 10*3/uL (ref 0.0–0.1)
Basophils Relative: 0.2 % (ref 0.0–3.0)
Eosinophils Absolute: 0 10*3/uL (ref 0.0–0.7)
Eosinophils Relative: 0 % (ref 0.0–5.0)
HCT: 36 % (ref 36.0–46.0)
Hemoglobin: 11.6 g/dL — ABNORMAL LOW (ref 12.0–15.0)
Lymphocytes Relative: 23.3 % (ref 12.0–46.0)
Lymphs Abs: 1.9 10*3/uL (ref 0.7–4.0)
MCHC: 32.2 g/dL (ref 30.0–36.0)
MCV: 77.7 fl — ABNORMAL LOW (ref 78.0–100.0)
Monocytes Absolute: 0.6 10*3/uL (ref 0.1–1.0)
Monocytes Relative: 7.1 % (ref 3.0–12.0)
Neutro Abs: 5.8 10*3/uL (ref 1.4–7.7)
Neutrophils Relative %: 69.4 % (ref 43.0–77.0)
Platelets: 238 10*3/uL (ref 150.0–400.0)
RBC: 4.63 Mil/uL (ref 3.87–5.11)
RDW: 17.6 % — ABNORMAL HIGH (ref 11.5–15.5)
WBC: 8.3 10*3/uL (ref 4.0–10.5)

## 2020-11-21 LAB — BASIC METABOLIC PANEL
BUN: 16 mg/dL (ref 6–23)
CO2: 25 mEq/L (ref 19–32)
Calcium: 9.2 mg/dL (ref 8.4–10.5)
Chloride: 104 mEq/L (ref 96–112)
Creatinine, Ser: 0.86 mg/dL (ref 0.40–1.20)
GFR: 72.53 mL/min (ref >60.00)
Glucose, Bld: 60 mg/dL — ABNORMAL LOW (ref 70–99)
Potassium: 3.8 mEq/L (ref 3.5–5.1)
Sodium: 138 mEq/L (ref 135–145)

## 2020-11-21 LAB — LIPID PANEL
Cholesterol: 110 mg/dL (ref 0–200)
HDL: 31.9 mg/dL — ABNORMAL LOW (ref >39.00)
LDL Cholesterol: 45 mg/dL (ref 0–99)
NonHDL: 77.84
Total CHOL/HDL Ratio: 3
Triglycerides: 166 mg/dL — ABNORMAL HIGH (ref 0.0–149.0)
VLDL: 33.2 mg/dL (ref 0.0–40.0)

## 2020-11-21 LAB — TSH: TSH: 1.26 u[IU]/mL (ref 0.35–5.50)

## 2020-11-21 LAB — VITAMIN D 25 HYDROXY (VIT D DEFICIENCY, FRACTURES): VITD: 14.42 ng/mL — ABNORMAL LOW (ref 30.00–100.00)

## 2020-11-21 LAB — HEMOGLOBIN A1C: Hgb A1c MFr Bld: 5.6 % (ref 4.6–6.5)

## 2020-11-21 NOTE — Assessment & Plan Note (Signed)
Pt's PE WNL w/ exception of obesity.  UTD on colonoscopy, Tdap.  Declines flu.  Due for pap and mammo w/ Dr Julien Girt- had to reschedule due to [place].  Check labs.  Anticipatory guidance provided.

## 2020-11-21 NOTE — Assessment & Plan Note (Signed)
Pt has lost 11 lbs since last visit.  Applauded her efforts and encouraged her to continue.  Check labs to risk stratify.  Will follow.

## 2020-11-21 NOTE — Assessment & Plan Note (Signed)
Pt has not had success w/ Dr Alvan Dame at [facility].  Will refer for a 2nd opinion.

## 2020-11-21 NOTE — Patient Instructions (Signed)
Follow up in 3-4 months to recheck diabetes We'll notify you of your lab results and make any changes if needed Continue to work on healthy diet and regular exercise- you're doing great!  Down 11 lbs! We'll call you with your Orthopedic Appt Call and schedule your pap and mammo w/ Dr Julien Girt Call with any questions or concerns Stay Safe!  Stay Healthy! Happy Holidays!!

## 2020-11-21 NOTE — Assessment & Plan Note (Signed)
Chronic problem.  UTD on eye exam, foot exam, microalbumin.  Check labs.  Adjust meds prn  

## 2020-11-21 NOTE — Progress Notes (Signed)
Subjective:    Patient ID: Ariana [name], female    DOB: 29-Oct-1960, 60 y.o.   MRN: [id number]  HPI CPE- UTD on colonoscopy.  Due for mammo.  UTD on Tdap.  Declines flu.  UTD on foot exam, eye exam (office is closed for renovations), microalbumin.  Patient Care Team    Relationship Specialty Notifications Start End  Midge Minium, MD PCP - General Family Medicine  08/29/15   Troy Sine, MD PCP - Cardiology Cardiology  11/28/19   Rigoberto Noel, MD Consulting Physician Pulmonary Disease  05/05/18   Troy Sine, MD Consulting Physician Cardiology  05/05/18   Marylynn Pearson, MD Consulting Physician Obstetrics and Gynecology  05/05/18     Health Maintenance  Topic Date Due   HIV Screening  Never done   Zoster Vaccines- Shingrix (1 of 2) Never done   MAMMOGRAM  07/23/2018   OPHTHALMOLOGY EXAM  06/07/2019   Pneumococcal Vaccine 78-71 Years old (2 - PCV) 11/07/2019   COVID-19 Vaccine (4 - Booster for Pfizer series) 01/26/2020   HEMOGLOBIN A1C  07/29/2020   INFLUENZA VACCINE  04/04/2021 (Originally 08/05/2020)   PAP SMEAR-Modifier  04/22/2021 (Originally 07/23/2019)   FOOT EXAM  01/29/2021   URINE MICROALBUMIN  01/29/2021   TETANUS/TDAP  08/28/2025   COLONOSCOPY (Pts 45-71yrs Insurance coverage will need to be confirmed)  06/10/2026   Hepatitis C Screening  Completed   HPV VACCINES  Aged Out     Review of Systems Patient reports no vision/ hearing changes, adenopathy,fever, persistant/recurrent hoarseness , swallowing issues, chest pain, palpitations, edema, hemoptysis, gastrointestinal bleeding (melena, rectal bleeding), abdominal pain, significant heartburn, bowel changes, GU symptoms (dysuria, hematuria, incontinence), Gyn symptoms (abnormal  bleeding, pain),  syncope, focal weakness, memory loss, numbness & tingling, hair/nail changes, abnormal bruising or bleeding, anxiety, or depression.   + 11 lb weight loss- eating a light breakfast and then having 1 full meal prior  to 6pm + cough since COVID + SOB w/ exertion since COVID + venous stasis changes  This visit occurred during the SARS-CoV-2 public health emergency.  Safety protocols were in place, including screening questions prior to the visit, additional usage of staff PPE, and extensive cleaning of exam room while observing appropriate contact time as indicated for disinfecting solutions.      Objective:   Physical Exam General Appearance:    Alert, cooperative, no distress, appears stated age, obese  Head:    Normocephalic, without obvious abnormality, atraumatic  Eyes:    PERRL, conjunctiva/corneas clear, EOM's intact, fundi    benign, both eyes  Ears:    Normal TM's and external ear canals, both ears  Nose:   Deferred due to COVID  Throat:   Neck:   Supple, symmetrical, trachea midline, no adenopathy;    Thyroid: no enlargement/tenderness/nodules  Back:     Symmetric, no curvature, ROM normal, no CVA tenderness  Lungs:     Clear to auscultation bilaterally, respirations unlabored  Chest Wall:    No tenderness or deformity   Heart:    Regular rate and rhythm, S1 and S2 normal, no murmur, rub   or gallop  Breast Exam:    Deferred to GYN  Abdomen:     Soft, non-tender, bowel sounds active all four quadrants,    no masses, no organomegaly  Genitalia:    Deferred to GYN  Rectal:    Extremities:   Extremities normal, atraumatic, no cyanosis or edema  Pulses:   2+ and symmetric  all extremities  Skin:   Skin color, texture, turgor normal, no rashes or lesions  Lymph nodes:   Cervical, supraclavicular, and axillary nodes normal  Neurologic:   CNII-XII intact, normal strength, sensation and reflexes    throughout          Assessment & Plan:

## 2020-11-21 NOTE — Assessment & Plan Note (Signed)
Check labs and replete prn. 

## 2020-11-25 ENCOUNTER — Telehealth: Payer: Self-pay

## 2020-11-25 NOTE — Telephone Encounter (Signed)
Pt asking if she needs to be Rx Vitamin D for low level on last lab

## 2020-11-25 NOTE — Telephone Encounter (Signed)
Caller name:Melek Willaims  On DPR? :Yes  Call back [phone]  Provider they see: Birdie Riddle   Reason for call:Pt is calling she received a message about her Vitamin D Being low and Dt Tabori mention she was going to send a RX for Vitamin D nothing has been sent to the pharmacy

## 2020-11-30 MED ORDER — VITAMIN D (ERGOCALCIFEROL) 1.25 MG (50000 UNIT) PO CAPS: 50000.0000 [IU] | ORAL_CAPSULE | ORAL | 0 refills | Status: DC

## 2020-11-30 NOTE — Telephone Encounter (Signed)
Prescription sent to pharmacy.

## 2020-11-30 NOTE — Addendum Note (Signed)
Addended by: Midge Minium on: 11/30/2020 10:58 AM   Modules accepted: Orders

## 2020-12-02 ENCOUNTER — Other Ambulatory Visit: Payer: Self-pay | Admitting: Family Medicine

## 2020-12-03 ENCOUNTER — Other Ambulatory Visit: Payer: Self-pay | Admitting: Family Medicine

## 2020-12-07 ENCOUNTER — Other Ambulatory Visit: Payer: Self-pay | Admitting: Family Medicine

## 2020-12-10 ENCOUNTER — Other Ambulatory Visit (HOSPITAL_BASED_OUTPATIENT_CLINIC_OR_DEPARTMENT_OTHER): Payer: Self-pay | Admitting: Orthopaedic Surgery

## 2020-12-10 ENCOUNTER — Ambulatory Visit (HOSPITAL_BASED_OUTPATIENT_CLINIC_OR_DEPARTMENT_OTHER): Payer: BC Managed Care – PPO | Admitting: Orthopaedic Surgery

## 2020-12-10 ENCOUNTER — Ambulatory Visit (HOSPITAL_BASED_OUTPATIENT_CLINIC_OR_DEPARTMENT_OTHER)
Admission: RE | Admit: 2020-12-10 | Discharge: 2020-12-10 | Disposition: A | Discharge status: Home / Self Care | Payer: BC Managed Care – PPO | Source: Ambulatory Visit | Attending: Orthopaedic Surgery | Admitting: Orthopaedic Surgery

## 2020-12-10 ENCOUNTER — Other Ambulatory Visit: Payer: Self-pay

## 2020-12-10 DIAGNOSIS — M170 Bilateral primary osteoarthritis of knee: Secondary | ICD-10-CM

## 2020-12-10 DIAGNOSIS — M25562 Pain in left knee: Secondary | ICD-10-CM | POA: Insufficient documentation

## 2020-12-10 DIAGNOSIS — M25561 Pain in right knee: Secondary | ICD-10-CM | POA: Diagnosis present

## 2020-12-10 NOTE — Progress Notes (Signed)
Chief Complaint: Bilateral knee pain     History of Present Illness:    Ariana [name] is a 60 y.o. female very pleasant female with bilateral knee pain now for many many years over 5.  She has previously been seen by Dr. Alvan Dame who has been performing steroid injections every year.  She has had 3 injections into both knees.  She states that she did get several months of relief from the first injections but no relief from the most recent injections.  She has been given a series of gel injections as well in the bilateral knees without any pain relief.  She has been taking and continues to take Advil which helps somewhat.  She is trying to flex agenic's program which did not help.  She has pain even when sitting at this point.  She continues to work as a Oceanographer.  She is hoping to consider knee arthroplasty at this point as she has been dealing with this for many years and is no longer getting any type of relief from any therapeutic treatments.    Surgical History:   None  PMH/PSH/Family History/Social History/Meds/Allergies:    Past Medical History:  Diagnosis Date   Anxiety    Depression    Hyperlipidemia    Hypertension    OA (osteoarthritis)    in Bil knees/ gets steroid injections for pain   Ovarian cyst    right ovary/had ablation   Past Surgical History:  Procedure Laterality Date   ENDOMETRIAL ABLATION  2012   OVARIAN CYST REMOVAL Right 2012   Social History   Socioeconomic History   Marital status: Divorced    Spouse name: Not on file   Number of children: Not on file   Years of education: Not on file   Highest education level: Not on file  Occupational History   Not on file  Tobacco Use   Smoking status: Former    Types: Cigarettes    Quit date: 01/18/1981    Years since quitting: 39.9   Smokeless tobacco: Never   Tobacco comments:    pt states she smoker for 3 years in high school (10/28/17)  Vaping Use   Vaping  Use: Never used  Substance and Sexual Activity   Alcohol use: No   Drug use: No   Sexual activity: Never  Other Topics Concern   Not on file  Social History Narrative   Not on file   Social Determinants of Health   Financial Resource Strain: Not on file  Food Insecurity: Not on file  Transportation Needs: Not on file  Physical Activity: Not on file  Stress: Not on file  Social Connections: Not on file   Family History  Problem Relation Age of Onset   Dementia Mother    Heart attack Father    Heart disease Father    Arthritis Father    Hyperlipidemia Father    Hypertension Father    Diabetes Brother    Arthritis Brother    Heart disease Brother    Liver disease Brother    Cancer Maternal Aunt    Allergies  Allergen Reactions   Penicillins Hives   Current Outpatient Medications  Medication Sig Dispense Refill   albuterol (VENTOLIN HFA) 108 (90 Base) MCG/ACT inhaler Inhale 2 puffs into the lungs every  6 (six) hours as needed for wheezing or shortness of breath. 18 g 6   ALPRAZolam (XANAX) 1 MG tablet TAKE 1/2 TABLET BY MOUTH TWICE A DAY AS NEEDED FOR ANXIETY 30 tablet 3   amLODipine (NORVASC) 5 MG tablet TAKE 1 TABLET BY MOUTH EVERY DAY 90 tablet 1   bisoprolol (ZEBETA) 5 MG tablet Take 1 tablet (5 mg total) by mouth daily. 90 tablet 1   buPROPion (WELLBUTRIN XL) 300 MG 24 hr tablet TAKE 1 TABLET BY MOUTH EVERY DAY 30 tablet 0   furosemide (LASIX) 20 MG tablet TAKE 1 TABLET BY MOUTH EVERY DAY AS NEEDED 90 tablet 1   ibuprofen (ADVIL,MOTRIN) 200 MG tablet Take 200 mg by mouth as needed.     JARDIANCE 10 MG TABS tablet TAKE 1 TABLET BY MOUTH EVERY DAY 30 tablet 6   omeprazole (PRILOSEC) 40 MG capsule TAKE 1 CAPSULE BY MOUTH EVERY DAY 90 capsule 1   rosuvastatin (CRESTOR) 20 MG tablet TAKE 1 TABLET BY MOUTH EVERY DAY 90 tablet 2   spironolactone (ALDACTONE) 25 MG tablet TAKE 1/2 TABLET BY MOUTH TWICE A DAY 90 tablet 1   SYMBICORT 160-4.5 MCG/ACT inhaler TAKE 2 PUFFS BY  MOUTH TWICE A DAY 10 each 0   Vitamin D, Ergocalciferol, (DRISDOL) 1.25 MG (50000 UNIT) CAPS capsule Take 1 capsule (50,000 Units total) by mouth every 7 (seven) days. 12 capsule 0   No current facility-administered medications for this visit.   No results found.  Review of Systems:   A ROS was performed including pertinent positives and negatives as documented in the HPI.  Physical Exam :   Constitutional: NAD and appears stated age Neurological: Alert and oriented Psych: Appropriate affect and cooperative There were no vitals taken for this visit.   Comprehensive Musculoskeletal Exam:    She has tenderness in all 3 compartments of bilateral knees.  Alignment is neutral.  She has crepitus bilaterally with range of motion from 0 to 135 degrees.  No laxity with varus or valgus stress.  Trace effusion bilaterally.  2+ dorsalis pedis bilaterally.  Imaging:   Xray (4 views left knee, 4 views right knee): Moderate to severe tricompartmental osteoarthritis in bilateral knees  I personally reviewed and interpreted the radiographs.   Assessment:   60 year old female with bilateral tricompartmental knee osteoarthritis that is very significant.  At this time she is trialed multiple nonoperative modalities and is no longer getting relief from injections.  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  She is a diabetic but has overall well controlled blood sugar on medications with an essentially normal most recent A1c.  I will plan for referral to one of my total arthroplasty colleagues to further consider this.  Plan :    -Plan for referral to total joint arthroplasty for discussion of     I personally saw and evaluated the patient, and participated in the management and treatment plan.  Vanetta Mulders, MD Attending Physician, Orthopedic Surgery  This document was dictated using Dragon voice recognition software. A reasonable attempt at  proof reading has been made to minimize errors.

## 2020-12-19 NOTE — Progress Notes (Signed)
Cardiology Office Note:    Date:  12/20/2020   ID:  CORTNEY BEISSEL, DOB 1960-09-21, MRN [id number]  PCP:  Midge Minium, MD  Cardiologist:  Shelva Majestic, MD  Primary APP: Fabian Sharp PA-C  Referring MD: Midge Minium, MD   Chief Complaint  Patient presents with   Follow-up    Annual follow up and preop clearance    History of Present Illness:    Ariana [name] is a 60 y.o. female with a hx of hypertension, OSA, obesity, GERD, and dyspnea on exertion.  She established care with Dr. Claiborne Billings for evaluation of dyspnea on exertion.  Initial echocardiogram ordered by pulmonology on 08/04/2017 showed LVEF 60 to 99%, grade 2 diastolic dysfunction.  PFTs in August 2019 showed FEV1 79%, FVC 83%, FEV1/FVC ratio 74%.  Coronary CT obtained 12/07/2017 showed coronary calcium score of 3.89 placing her at the 69th percentile.  No significant CAD.  She was last seen by Almyra Deforest, PA-C on 11/28/2019 for a telehealth visit.  She continued to report chest tightness with very strenuous activities.  It was felt that this was more related to her pulmonary status rather than CAD.  She presents today for annual follow-up.  She anticipates having knee replacements in the next 1-2 months. She is primarily limited by her asthma and knee pain. 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. Overall, doing well from a cardiovascular standpoint, no complaints.    Past Medical History:  Diagnosis Date   Anxiety    Depression    Hyperlipidemia    Hypertension    OA (osteoarthritis)    in Bil knees/ gets steroid injections for pain   Ovarian cyst    right ovary/had ablation    Past Surgical History:  Procedure Laterality Date   ENDOMETRIAL ABLATION  2012   OVARIAN CYST REMOVAL Right 2012    Current Medications: Current Meds  Medication Sig   albuterol (VENTOLIN HFA) 108 (90 Base) MCG/ACT inhaler Inhale 2 puffs into the lungs every 6 (six) hours  as needed for wheezing or shortness of breath.   ALPRAZolam (XANAX) 1 MG tablet TAKE 1/2 TABLET BY MOUTH TWICE A DAY AS NEEDED FOR ANXIETY   amLODipine (NORVASC) 5 MG tablet TAKE 1 TABLET BY MOUTH EVERY DAY   bisoprolol (ZEBETA) 5 MG tablet Take 1 tablet (5 mg total) by mouth daily.   buPROPion (WELLBUTRIN XL) 300 MG 24 hr tablet TAKE 1 TABLET BY MOUTH EVERY DAY   furosemide (LASIX) 20 MG tablet TAKE 1 TABLET BY MOUTH EVERY DAY AS NEEDED   ibuprofen (ADVIL,MOTRIN) 200 MG tablet Take 200 mg by mouth as needed.   JARDIANCE 10 MG TABS tablet TAKE 1 TABLET BY MOUTH EVERY DAY   omeprazole (PRILOSEC) 40 MG capsule TAKE 1 CAPSULE BY MOUTH EVERY DAY   rosuvastatin (CRESTOR) 20 MG tablet TAKE 1 TABLET BY MOUTH EVERY DAY   spironolactone (ALDACTONE) 25 MG tablet TAKE 1/2 TABLET BY MOUTH TWICE A DAY   SYMBICORT 160-4.5 MCG/ACT inhaler TAKE 2 PUFFS BY MOUTH TWICE A DAY   Vitamin D, Ergocalciferol, (DRISDOL) 1.25 MG (50000 UNIT) CAPS capsule Take 1 capsule (50,000 Units total) by mouth every 7 (seven) days.     Allergies:   Penicillins   Social History   Socioeconomic History   Marital status: Divorced    Spouse name: Not on file   Number of children: Not on file   Years of education: Not on  file   Highest education level: Not on file  Occupational History   Not on file  Tobacco Use   Smoking status: Former    Types: Cigarettes    Quit date: 01/18/1981    Years since quitting: 39.9   Smokeless tobacco: Never   Tobacco comments:    pt states she smoker for 3 years in high school (10/28/17)  Vaping Use   Vaping Use: Never used  Substance and Sexual Activity   Alcohol use: No   Drug use: No   Sexual activity: Never  Other Topics Concern   Not on file  Social History Narrative   Not on file   Social Determinants of Health   Financial Resource Strain: Not on file  Food Insecurity: Not on file  Transportation Needs: Not on file  Physical Activity: Not on file  Stress: Not on file   Social Connections: Not on file     Family History: The patient's family history includes Arthritis in her brother and father; Cancer in her maternal aunt; Dementia in her mother; Diabetes in her brother; Heart attack in her father; Heart disease in her brother and father; Hyperlipidemia in her father; Hypertension in her father; Liver disease in her brother.  ROS:   Please see the history of present illness.     All other systems reviewed and are negative.  EKGs/Labs/Other Studies Reviewed:    The following studies were reviewed today:  Echo 08/04/2017 LV EF: 60% -   65%  Study Conclusions   - Left ventricle: The cavity size was normal. Wall thickness was    normal. Systolic function was normal. The estimated ejection    fraction was in the range of 60% to 65%. Wall motion was normal;    there were no regional wall motion abnormalities. Features are    consistent with a pseudonormal left ventricular filling pattern,    with concomitant abnormal relaxation and increased filling    pressure (grade 2 diastolic dysfunction).      Coronary CT 12/07/2017 IMPRESSION: 1. Coronary calcium score of 3.89 This was 69th percentile for age and sex matched control.   2. Normal coronary origin with right dominance.   3. No evidence of obstructive CAD.    EKG:  EKG is  ordered today.  The ekg ordered today demonstrates sinus rhythm with HR 74, nonspecific ST abnormalities stable from prior  Recent Labs: 11/21/2020: ALT 17; BUN 16; Creatinine, Ser 0.86; Hemoglobin 11.6; Platelets 238.0; Potassium 3.8; Sodium 138; TSH 1.26  Recent Lipid Panel    Component Value Date/Time   CHOL 110 11/21/2020 1410   TRIG 166.0 (H) 11/21/2020 1410   HDL 31.90 (L) 11/21/2020 1410   CHOLHDL 3 11/21/2020 1410   VLDL 33.2 11/21/2020 1410   LDLCALC 45 11/21/2020 1410   LDLDIRECT 76.0 01/30/2020 1348    Physical Exam:    VS:  BP 110/60    Pulse 74    Ht 5\' 3"  (1.6 m)    Wt 248 lb 12.8 oz (112.9 kg)     SpO2 98%    BMI 44.07 kg/m     Wt Readings from Last 3 Encounters:  12/20/20 248 lb 12.8 oz (112.9 kg)  11/21/20 252 lb 6.4 oz (114.5 kg)  01/30/20 263 lb (119.3 kg)     GEN: obese female in NAD HEENT: Normal NECK: No JVD; No carotid bruits LYMPHATICS: No lymphadenopathy CARDIAC: RRR, no murmurs, rubs, gallops RESPIRATORY:  Clear to auscultation without rales, wheezing or rhonchi  ABDOMEN: Soft, non-tender, non-distended MUSCULOSKELETAL:  Mild B LE edema with chronic skin changes SKIN: Warm and dry NEUROLOGIC:  Alert and oriented x 3 PSYCHIATRIC:  Normal affect   ASSESSMENT:    1. Essential hypertension   2. Coronary artery disease involving native coronary artery of native heart without angina pectoris   3. Hyperlipidemia with target LDL less than 70   4. Family history of early CAD   66. Grade II diastolic dysfunction   6. Morbid obesity ([place])   7. Type 2 diabetes mellitus without complication, without long-term current use of insulin ([place])   8. Preoperative clearance    PLAN:    In order of problems listed above:  Minimal CAD Hyperlipidemia with LDL goal less than 70 Focus on risk factor management  Lipid profile 11/21/20: Total chol 110 LDL 45 HDL 32 Triglycerides 166 - was not fasting at the time of these labs   Chronic diastolic heart failure Chronic dyspnea on exertion Reassuring echocardiogram and CT coronary in 2019 She does have grade 2 diastolic dysfunction She is on good goal-directed medical therapy including bisoprolol, spironolactone, and Lasix.  Vania Rea has been added to her regimen. 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 I have advised compression stockings and gave the Ted hose sheet.    Hypertension Maintained on 5 mg amlodipine, 5 mg bisoprolol, 12.5 mg spironolactone, and 20 mg Lasix daily. BP well controlled, no change in therapy.    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    Preoperative clearance She has mild CAD by coronary CT in 2019. She denies anginal symptoms. 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 She is primarily limited by her asthma and knee pain. She is not on insulin, does not have a history of stroke or heart failure, and has normal renal function. She would be at acceptable risk to proceed with knee surgery. She is on no antiplatelets or anticoagulants.    Follow up with me or Dr. Claiborne Billings in one year.   Medication Adjustments/Labs and Tests Ordered: Current medicines are reviewed at length with the patient today.  Concerns regarding medicines are outlined above.  Orders Placed This Encounter  Procedures   EKG 12-Lead   No orders of the defined types were placed in this encounter.   Signed, Ariana Bottcher, PA  12/20/2020 2:50 PM    [facility]

## 2020-12-20 ENCOUNTER — Encounter: Payer: Self-pay | Admitting: Physician Assistant

## 2020-12-20 ENCOUNTER — Other Ambulatory Visit: Payer: Self-pay

## 2020-12-20 ENCOUNTER — Ambulatory Visit: Payer: BC Managed Care – PPO | Admitting: Physician Assistant

## 2020-12-20 VITALS — BP 110/60 | HR 74 | Ht 63.0 in | Wt 248.8 lb

## 2020-12-20 DIAGNOSIS — I2510 Atherosclerotic heart disease of native coronary artery without angina pectoris: Secondary | ICD-10-CM

## 2020-12-20 DIAGNOSIS — E785 Hyperlipidemia, unspecified: Secondary | ICD-10-CM

## 2020-12-20 DIAGNOSIS — Z8249 Family history of ischemic heart disease and other diseases of the circulatory system: Secondary | ICD-10-CM

## 2020-12-20 DIAGNOSIS — Z01810 Encounter for preprocedural cardiovascular examination: Secondary | ICD-10-CM

## 2020-12-20 DIAGNOSIS — I10 Essential (primary) hypertension: Secondary | ICD-10-CM

## 2020-12-20 DIAGNOSIS — I5189 Other ill-defined heart diseases: Secondary | ICD-10-CM

## 2020-12-20 DIAGNOSIS — Z01818 Encounter for other preprocedural examination: Secondary | ICD-10-CM

## 2020-12-20 DIAGNOSIS — E119 Type 2 diabetes mellitus without complications: Secondary | ICD-10-CM

## 2020-12-20 NOTE — Patient Instructions (Signed)
Medication Instructions:  Your physician recommends that you continue on your current medications as directed. Please refer to the Current Medication list given to you today.  *If you need a refill on your cardiac medications before your next appointment, please call your pharmacy*   Lab Work: None ordered  If you have labs (blood work) drawn today and your tests are completely normal, you will receive your results only by: [place] (if you have MyChart) OR A paper copy in the mail If you have any lab test that is abnormal or we need to change your treatment, we will call you to review the results.   Testing/Procedures: None ordered   Follow-Up: At [facility], you and your health needs are our priority.  As part of our continuing mission to provide you with exceptional heart care, we have created designated Provider Care Teams.  These Care Teams include your primary Cardiologist (physician) and Advanced Practice Providers (APPs -  Physician Assistants and Nurse Practitioners) who all work together to provide you with the care you need, when you need it.  We recommend signing up for the patient portal called "MyChart".  Sign up information is provided on this After Visit Summary.  MyChart is used to connect with patients for Virtual Visits (Telemedicine).  Patients are able to view lab/test results, encounter notes, upcoming appointments, etc.  Non-urgent messages can be sent to your provider as well.   To learn more about what you can do with MyChart, go to [email or web address].    Your next appointment:   12 month(s)  The format for your next appointment:   In Person  Provider:   Shelva Majestic, MD     Other Instructions

## 2020-12-25 ENCOUNTER — Encounter (HOSPITAL_BASED_OUTPATIENT_CLINIC_OR_DEPARTMENT_OTHER): Payer: Self-pay | Admitting: Orthopaedic Surgery

## 2020-12-26 NOTE — Telephone Encounter (Signed)
This was already faxed to [facility]

## 2020-12-27 ENCOUNTER — Other Ambulatory Visit: Payer: Self-pay | Admitting: Family Medicine

## 2021-01-01 ENCOUNTER — Other Ambulatory Visit: Payer: Self-pay | Admitting: Cardiovascular Disease

## 2021-01-03 ENCOUNTER — Telehealth: Payer: Self-pay

## 2021-01-03 NOTE — Telephone Encounter (Signed)
Caller name:Latoyia Mcwhirter   On DPR? :Yes  Call back number:[phone]  Provider they see: Tabori   Reason for call:Murphy No[facility] form placed in bin to be sign and returned

## 2021-01-03 NOTE — Telephone Encounter (Signed)
Placed form in to be signed folder at nurse station

## 2021-01-07 NOTE — Telephone Encounter (Signed)
Form was faxed and placed in the faxed bin up front °

## 2021-01-29 ENCOUNTER — Other Ambulatory Visit: Payer: Self-pay | Admitting: Cardiovascular Disease

## 2021-01-29 NOTE — Telephone Encounter (Signed)
Called office I have verified with [facility] that they didn't receive the clearance form. It hasn't made it to scan and they need it asap.   I have placed this in the bin up front.

## 2021-01-29 NOTE — Telephone Encounter (Signed)
This is the form I had brought to you and explained. Can fax again once you have had the time to complete!   Thank you

## 2021-01-29 NOTE — Telephone Encounter (Signed)
Form completed and placed in basket  

## 2021-01-29 NOTE — Telephone Encounter (Signed)
faxed

## 2021-02-24 ENCOUNTER — Encounter: Payer: Self-pay | Admitting: Family Medicine

## 2021-02-24 ENCOUNTER — Ambulatory Visit: Payer: BC Managed Care – PPO | Admitting: Family Medicine

## 2021-02-24 VITALS — BP 106/62 | HR 57 | Temp 97.9°F | Resp 16 | Wt 248.6 lb

## 2021-02-24 DIAGNOSIS — Z124 Encounter for screening for malignant neoplasm of cervix: Secondary | ICD-10-CM | POA: Diagnosis not present

## 2021-02-24 DIAGNOSIS — E119 Type 2 diabetes mellitus without complications: Secondary | ICD-10-CM | POA: Diagnosis not present

## 2021-02-24 DIAGNOSIS — Z1231 Encounter for screening mammogram for malignant neoplasm of breast: Secondary | ICD-10-CM

## 2021-02-24 LAB — MICROALBUMIN / CREATININE URINE RATIO
Creatinine,U: 189.1 mg/dL
Microalb Creat Ratio: 1.2 mg/g (ref 0.0–30.0)
Microalb, Ur: 2.3 mg/dL — ABNORMAL HIGH (ref 0.0–1.9)

## 2021-02-24 LAB — BASIC METABOLIC PANEL
BUN: 11 mg/dL (ref 6–23)
CO2: 32 mEq/L (ref 19–32)
Calcium: 9.3 mg/dL (ref 8.4–10.5)
Chloride: 104 mEq/L (ref 96–112)
Creatinine, Ser: 0.87 mg/dL (ref 0.40–1.20)
GFR: 71.41 mL/min (ref >60.00)
Glucose, Bld: 95 mg/dL (ref 70–99)
Potassium: 3.5 mEq/L (ref 3.5–5.1)
Sodium: 141 mEq/L (ref 135–145)

## 2021-02-24 LAB — HEMOGLOBIN A1C: Hgb A1c MFr Bld: 5.7 % (ref 4.6–6.5)

## 2021-02-24 NOTE — Assessment & Plan Note (Signed)
Pt has upcoming appt w/ [facility] b/c she can't have knee replacement unless BMI<40.  Encouraged low carb diet and exercise as she is able (due to severe knee arthritis).  Applauded her current efforts.  Will follow.

## 2021-02-24 NOTE — Progress Notes (Signed)
° °  Subjective:    Patient ID: Ariana [name], female    DOB: 1960-06-09, 61 y.o.   MRN: [id number]  HPI DM- chronic problem, on Jardiance 10mg  daily.  Due for foot exam.  Due for microalbumin.  UTD on eye exam.  No CP, SOB, HAs, visual changes, abd pain, N/V.  No numbness/tingling of hands/feet.  Rare symptomatic lows.  Obesity- pt has upcoming appt w/ [facility].  Goal is to lose weight for upcoming knee replacement.  Exercise is limited due to bilateral knee arthritis  Health Maintenance- due for pap and mammo.  Will refer back to GYN as pt admits she has been putting this off.   Review of Systems For ROS see HPI   This visit occurred during the SARS-CoV-2 public health emergency.  Safety protocols were in place, including screening questions prior to the visit, additional usage of staff PPE, and extensive cleaning of exam room while observing appropriate contact time as indicated for disinfecting solutions.      Objective:   Physical Exam Vitals reviewed.  Constitutional:      General: She is not in acute distress.    Appearance: Normal appearance. She is well-developed. She is obese. She is not ill-appearing.  HENT:     Head: Normocephalic and atraumatic.  Eyes:     Conjunctiva/sclera: Conjunctivae normal.     Pupils: Pupils are equal, round, and reactive to light.  Neck:     Thyroid: No thyromegaly.  Cardiovascular:     Rate and Rhythm: Normal rate and regular rhythm.     Pulses: Normal pulses.     Heart sounds: Normal heart sounds. No murmur heard. Pulmonary:     Effort: Pulmonary effort is normal. No respiratory distress.     Breath sounds: Normal breath sounds.  Abdominal:     General: There is no distension.     Palpations: Abdomen is soft.     Tenderness: There is no abdominal tenderness.  Musculoskeletal:     Cervical back: Normal range of motion and neck supple.     Right lower leg: No edema.     Left lower leg: No edema.  Lymphadenopathy:      Cervical: No cervical adenopathy.  Skin:    General: Skin is warm and dry.  Neurological:     General: No focal deficit present.     Mental Status: She is alert and oriented to person, place, and time.  Psychiatric:        Mood and Affect: Mood normal.        Behavior: Behavior normal.          Assessment & Plan:  Pap/Mammo- pt is overdue for both.  Sees Dr Julien Girt at [facility] but admits she has avoided calling to schedule.  I placed referral so hopefully someone will call her and take the effort out of it.  Pt expressed understanding and is in agreement w/ plan.

## 2021-02-24 NOTE — Patient Instructions (Addendum)
Follow up in 3-4 months to recheck diabetes, blood pressure, and cholesterol We'll notify you of your lab results and make any changes if needed Continue to work on low carb diet and exercise as able- you can do it!!! We'll call you to schedule your GYN exam Call with any questions or concerns Stay Safe!  Stay Healthy! Happy Spring!!!

## 2021-02-24 NOTE — Assessment & Plan Note (Signed)
Chronic problem.  On Jardiance w/o difficulty.  Foot exam done.  UTD on eye exam- attempting to get copy.  Microalbumin ordered.  Currently asymptomatic.  Check labs.  Adjust meds prn

## 2021-03-28 ENCOUNTER — Other Ambulatory Visit: Payer: Self-pay | Admitting: Family Medicine

## 2021-04-10 ENCOUNTER — Other Ambulatory Visit: Payer: Self-pay | Admitting: Family Medicine

## 2021-04-16 ENCOUNTER — Other Ambulatory Visit: Payer: Self-pay | Admitting: Family Medicine

## 2021-04-23 LAB — HM PAP SMEAR

## 2021-04-23 LAB — HM MAMMOGRAPHY

## 2021-04-24 ENCOUNTER — Other Ambulatory Visit: Payer: Self-pay | Admitting: Pulmonary Disease

## 2021-05-05 ENCOUNTER — Other Ambulatory Visit: Payer: Self-pay | Admitting: Family Medicine

## 2021-05-05 LAB — HM DIABETES EYE EXAM

## 2021-05-12 ENCOUNTER — Encounter: Payer: Self-pay | Admitting: Family Medicine

## 2021-05-22 ENCOUNTER — Encounter: Payer: Self-pay | Admitting: Gastroenterology

## 2021-06-05 ENCOUNTER — Other Ambulatory Visit: Payer: Self-pay | Admitting: Cardiovascular Disease

## 2021-06-05 ENCOUNTER — Other Ambulatory Visit: Payer: Self-pay | Admitting: Family Medicine

## 2021-06-24 ENCOUNTER — Encounter: Payer: Self-pay | Admitting: Family Medicine

## 2021-06-24 ENCOUNTER — Ambulatory Visit: Payer: BC Managed Care – PPO | Admitting: Family Medicine

## 2021-06-24 VITALS — BP 132/80 | HR 58 | Temp 97.3°F | Resp 16 | Ht 63.0 in | Wt 221.4 lb

## 2021-06-24 DIAGNOSIS — E785 Hyperlipidemia, unspecified: Secondary | ICD-10-CM

## 2021-06-24 DIAGNOSIS — I10 Essential (primary) hypertension: Secondary | ICD-10-CM

## 2021-06-24 DIAGNOSIS — E119 Type 2 diabetes mellitus without complications: Secondary | ICD-10-CM

## 2021-06-24 LAB — HEPATIC FUNCTION PANEL
ALT: 20 U/L (ref 0–35)
AST: 22 U/L (ref 0–37)
Albumin: 4.3 g/dL (ref 3.5–5.2)
Alkaline Phosphatase: 77 U/L (ref 39–117)
Bilirubin, Direct: 0.1 mg/dL (ref 0.0–0.3)
Total Bilirubin: 0.4 mg/dL (ref 0.2–1.2)
Total Protein: 7.8 g/dL (ref 6.0–8.3)

## 2021-06-24 LAB — LIPID PANEL
Cholesterol: 111 mg/dL (ref 0–200)
HDL: 34.2 mg/dL — ABNORMAL LOW (ref >39.00)
LDL Cholesterol: 47 mg/dL (ref 0–99)
NonHDL: 76.36
Total CHOL/HDL Ratio: 3
Triglycerides: 147 mg/dL (ref 0.0–149.0)
VLDL: 29.4 mg/dL (ref 0.0–40.0)

## 2021-06-24 LAB — CBC WITH DIFFERENTIAL/PLATELET
Basophils Absolute: 0 10*3/uL (ref 0.0–0.1)
Basophils Relative: 0.2 % (ref 0.0–3.0)
Eosinophils Absolute: 0 10*3/uL (ref 0.0–0.7)
Eosinophils Relative: 0 % (ref 0.0–5.0)
HCT: 42.6 % (ref 36.0–46.0)
Hemoglobin: 13.7 g/dL (ref 12.0–15.0)
Lymphocytes Relative: 25.6 % (ref 12.0–46.0)
Lymphs Abs: 1.8 10*3/uL (ref 0.7–4.0)
MCHC: 32.1 g/dL (ref 30.0–36.0)
MCV: 77.4 fl — ABNORMAL LOW (ref 78.0–100.0)
Monocytes Absolute: 0.5 10*3/uL (ref 0.1–1.0)
Monocytes Relative: 6.7 % (ref 3.0–12.0)
Neutro Abs: 4.7 10*3/uL (ref 1.4–7.7)
Neutrophils Relative %: 67.5 % (ref 43.0–77.0)
Platelets: 272 10*3/uL (ref 150.0–400.0)
RBC: 5.51 Mil/uL — ABNORMAL HIGH (ref 3.87–5.11)
RDW: 17.9 % — ABNORMAL HIGH (ref 11.5–15.5)
WBC: 6.9 10*3/uL (ref 4.0–10.5)

## 2021-06-24 LAB — HEMOGLOBIN A1C: Hgb A1c MFr Bld: 5.4 % (ref 4.6–6.5)

## 2021-06-24 LAB — BASIC METABOLIC PANEL
BUN: 10 mg/dL (ref 6–23)
CO2: 26 mEq/L (ref 19–32)
Calcium: 10.3 mg/dL (ref 8.4–10.5)
Chloride: 104 mEq/L (ref 96–112)
Creatinine, Ser: 1.01 mg/dL (ref 0.40–1.20)
GFR: 59.56 mL/min — ABNORMAL LOW (ref >60.00)
Glucose, Bld: 69 mg/dL — ABNORMAL LOW (ref 70–99)
Potassium: 3.9 mEq/L (ref 3.5–5.1)
Sodium: 139 mEq/L (ref 135–145)

## 2021-06-24 LAB — TSH: TSH: 1.37 u[IU]/mL (ref 0.35–5.50)

## 2021-06-24 NOTE — Assessment & Plan Note (Signed)
Chronic problem.  Currently adequate control on Amlodipine '5mg'$  daily, Bisoprolol '5mg'$  daily, Spironolactone 12.'5mg'$  BID.  Asymptomatic at this time.  Check labs due to diuretic use but no anticipated med changes.  Will follow.

## 2021-06-24 NOTE — Assessment & Plan Note (Signed)
Chronic problem.  Tolerating Crestor 20mg daily w/o difficulty.  Check labs.  Adjust meds prn  

## 2021-06-24 NOTE — Patient Instructions (Signed)
Follow up in 3-4 months to recheck sugars We'll notify you of your lab results and make any changes if needed Continue to work on low carb diet and regular exercise- you're doing GREAT!!! We can either continue the Ozempic or stop it once you have reached your weight loss goal.  It is totally up to you Call with any questions or concerns Have a great summer!!!

## 2021-06-24 NOTE — Progress Notes (Signed)
   Subjective:    Patient ID: Ariana [name], female    DOB: 1960-12-08, 61 y.o.   MRN: [id number]  HPI DM- chronic problem, on Jardiance '10mg'$  daily, Ozempic 0.'25mg'$  weekly.  UTD on eye exam, foot exam, microalbumin.  Denies symptomatic lows.  No visual changes.  No numbness/tingling of hands/feet  HTN- chronic problem, on Amlodipine '5mg'$  daily, Bisoprolol '5mg'$  daily, Spironolactone 12.'5mg'$  BID w/ adequate control.  Denies CP, SOB, HAs, visual changes, edema.  Hyperlipidemia- chronic problem, on Crestor '20mg'$  daily.  Denies abd pain, N/V.  Morbid obesity- pt is down 30 lbs since Feb.  Currently working w/ [facility] at [employer] and is on [place].  Drinking 4 shakes/day but these are causing GI upset.  Has switched off of shakes and now doing 1200 calorie diet.     Review of Systems For ROS see HPI     Objective:   Physical Exam Vitals reviewed.  Constitutional:      General: She is not in acute distress.    Appearance: Normal appearance. She is well-developed. She is obese. She is not ill-appearing.  HENT:     Head: Normocephalic and atraumatic.  Eyes:     Conjunctiva/sclera: Conjunctivae normal.     Pupils: Pupils are equal, round, and reactive to light.  Neck:     Thyroid: No thyromegaly.  Cardiovascular:     Rate and Rhythm: Normal rate and regular rhythm.     Pulses: Normal pulses.     Heart sounds: Normal heart sounds. No murmur heard. Pulmonary:     Effort: Pulmonary effort is normal. No respiratory distress.     Breath sounds: Normal breath sounds.  Abdominal:     General: There is no distension.     Palpations: Abdomen is soft.     Tenderness: There is no abdominal tenderness.  Musculoskeletal:     Cervical back: Normal range of motion and neck supple.     Right lower leg: No edema.     Left lower leg: No edema.  Lymphadenopathy:     Cervical: No cervical adenopathy.  Skin:    General: Skin is warm and dry.  Neurological:     Mental Status: She is alert and  oriented to person, place, and time.  Psychiatric:        Behavior: Behavior normal.           Assessment & Plan:

## 2021-06-24 NOTE — Assessment & Plan Note (Signed)
Chronic problem.  Currently on Jardiance '10mg'$  daily and was started on [facility]empic by [facility].  Discussed possibility of stopping Jardiance but it also offers heart protection so will continue at this time.  UTD on foot exam, eye exam, and microalbumin.  Check labs.  Adjust meds prn

## 2021-06-24 NOTE — Assessment & Plan Note (Signed)
Pt is down 30 lbs since last visit!!  Is working w/ [facility] at [employer] and is now on [place].  Applauded her efforts and encouraged her to continue.  Will follow.

## 2021-06-25 NOTE — Progress Notes (Signed)
Pt seen results Via my chart  

## 2021-06-26 ENCOUNTER — Encounter: Payer: Self-pay | Admitting: Family Medicine

## 2021-07-07 ENCOUNTER — Telehealth: Payer: Self-pay

## 2021-07-07 ENCOUNTER — Telehealth: Payer: Self-pay | Admitting: *Deleted

## 2021-07-07 NOTE — Telephone Encounter (Signed)
Pt is agreeable to plan of care for tele pre op appt 07/14/21 @ 3 pm. Med rec and consent are done.

## 2021-07-07 NOTE — Telephone Encounter (Signed)
   Name: Ariana [name]  DOB: 09/18/1960  MRN: [id number]  Primary Cardiologist: Shelva Majestic, MD   Preoperative team, please contact this patient and set up a phone call appointment for further preoperative risk assessment. Please obtain consent and complete medication review. Thank you for your help.  I confirm that guidance regarding antiplatelet and oral anticoagulation therapy has been completed and, if necessary, noted below.   Lenna Sciara, NP 07/07/2021, 4:53 PM [place] [address] [place], [place] [address] '

## 2021-07-07 NOTE — Telephone Encounter (Signed)
Pt is agreeable to plan of care for tele pre op appt 07/14/21 @ 3 pm. Med rec and consent are done.     Patient Consent for Virtual Visit        Ariana [name] has provided verbal consent on 07/07/2021 for a virtual visit (video or telephone).   CONSENT FOR VIRTUAL VISIT FOR:  Ariana [name]  By participating in this virtual visit I agree to the following:  I hereby voluntarily request, consent and authorize [facility] and its employed or contracted physicians, physician assistants, nurse practitioners or other licensed health care professionals (the Practitioner), to provide me with telemedicine health care services (the "Services") as deemed necessary by the treating Practitioner. I acknowledge and consent to receive the Services by the Practitioner via telemedicine. I understand that the telemedicine visit will involve communicating with the Practitioner through live audiovisual communication technology and the disclosure of certain medical information by electronic transmission. I acknowledge that I have been given the opportunity to request an in-person assessment or other available alternative prior to the telemedicine visit and am voluntarily participating in the telemedicine visit.  I understand that I have the right to withhold or withdraw my consent to the use of telemedicine in the course of my care at any time, without affecting my right to future care or treatment, and that the Practitioner or I may terminate the telemedicine visit at any time. I understand that I have the right to inspect all information obtained and/or recorded in the course of the telemedicine visit and may receive copies of available information for a reasonable fee.  I understand that some of the potential risks of receiving the Services via telemedicine include:  Delay or interruption in medical evaluation due to technological equipment failure or disruption; Information transmitted may not be sufficient (e.g.  poor resolution of images) to allow for appropriate medical decision making by the Practitioner; and/or  In rare instances, security protocols could fail, causing a breach of personal health information.  Furthermore, I acknowledge that it is my responsibility to provide information about my medical history, conditions and care that is complete and accurate to the best of my ability. I acknowledge that Practitioner's advice, recommendations, and/or decision may be based on factors not within their control, such as incomplete or inaccurate data provided by me or distortions of diagnostic images or specimens that may result from electronic transmissions. I understand that the practice of medicine is not an exact science and that Practitioner makes no warranties or guarantees regarding treatment outcomes. I acknowledge that a copy of this consent can be made available to me via my patient portal ([facility]), or I can request a printed copy by calling the office of [facility].    I understand that my insurance will be billed for this visit.   I have read or had this consent read to me. I understand the contents of this consent, which adequately explains the benefits and risks of the Services being provided via telemedicine.  I have been provided ample opportunity to ask questions regarding this consent and the Services and have had my questions answered to my satisfaction. I give my informed consent for the services to be provided through the use of telemedicine in my medical care

## 2021-07-07 NOTE — Telephone Encounter (Signed)
   Pre-operative Risk Assessment    Patient Name: Ariana [name]  DOB: 11/02/1960 MRN: [id number]      Request for Surgical Clearance    Procedure:   Right total knee replacement  Date of Surgery:  Clearance TBD                                 Surgeon:  Charlies Constable, MD Surgeon's Group or Practice Name:  [facility] Phone number:  [phone] Ext [phone] Fax number:  [phone] ATTN: [facility]   Type of Clearance Requested:   - Medical    Type of Anesthesia:  Spinal   Additional requests/questions:   None  Signed, Faydra Korman   07/07/2021, 4:43 PM

## 2021-07-09 ENCOUNTER — Telehealth: Payer: Self-pay

## 2021-07-09 NOTE — Telephone Encounter (Signed)
Pt is scheduled for a surgical clearance apt July 20,2023. Form in Dr Birdie Riddle to be signed folder

## 2021-07-10 ENCOUNTER — Other Ambulatory Visit: Payer: Self-pay | Admitting: Family Medicine

## 2021-07-14 ENCOUNTER — Ambulatory Visit (INDEPENDENT_AMBULATORY_CARE_PROVIDER_SITE_OTHER): Payer: BC Managed Care – PPO | Admitting: Nurse Practitioner

## 2021-07-14 DIAGNOSIS — Z01810 Encounter for preprocedural cardiovascular examination: Secondary | ICD-10-CM | POA: Diagnosis not present

## 2021-07-14 NOTE — Progress Notes (Signed)
Virtual Visit via Telephone Note   Because of Ariana [name]'s co-morbid illnesses, she is at least at moderate risk for complications without adequate follow up.  This format is felt to be most appropriate for this patient at this time.  The patient did not have access to video technology/had technical difficulties with video requiring transitioning to audio format only (telephone).  All issues noted in this document were discussed and addressed.  No physical exam could be performed with this format.  Please refer to the patient's chart for her consent to telehealth for Ariana [name].  Evaluation Performed:  Preoperative cardiovascular risk assessment _____________   Date:  07/14/2021   Patient ID:  Ariana [name], DOB 1960-09-07, MRN [id number] Patient Location:  Home Provider location:   Office  Primary Care Provider:  Midge Minium, MD Primary Cardiologist:  Shelva Majestic, MD  Chief Complaint / Patient Profile   61 y.o. y/o female with a h/o hypertension, OSA, obesity, chronic HFpEF, elevated calcium score, hyperlipidemia, type 2 diabetes, who is pending right total knee replacement and presents today for telephonic preoperative cardiovascular risk assessment.  Past Medical History    Past Medical History:  Diagnosis Date   Anxiety    Depression    Hyperlipidemia    Hypertension    OA (osteoarthritis)    in Bil knees/ gets steroid injections for pain   Ovarian cyst    right ovary/had ablation   Past Surgical History:  Procedure Laterality Date   ENDOMETRIAL ABLATION  2012   OVARIAN CYST REMOVAL Right 2012    Allergies  Allergies  Allergen Reactions   Penicillins Hives and Rash    History of Present Illness    Ariana [name] is a 61 y.o. female who presents via audio/video conferencing for a telehealth visit today.  Pt was last seen in cardiology clinic on 12/20/20 by Doreene Adas, PA.  At that time Ariana [name] was doing well.  The patient is now  pending procedure as outlined above. Since her last visit, she  denies chest pain, shortness of breath, lower extremity edema, fatigue, palpitations, melena, hematuria, hemoptysis, diaphoresis, weakness, presyncope, syncope, orthopnea, and PND. She has 30 lbs since her office visit. 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 Symptoms do not occur at other times. She walks shorter distances for exercise without SOB or chest pain.    Home Medications    Prior to Admission medications   Medication Sig Start Date End Date Taking? Authorizing Provider  albuterol (VENTOLIN HFA) 108 (90 Base) MCG/ACT inhaler Inhale 2 puffs into the lungs every 6 (six) hours as needed for wheezing or shortness of breath. 01/19/19   Rigoberto Noel, MD  ALPRAZolam Duanne Moron) 1 MG tablet TAKE 1/2 TABLET BY MOUTH TWICE A DAY AS NEEDED FOR ANXIETY 04/17/21   Maximiano Coss, NP  amLODipine (NORVASC) 5 MG tablet TAKE 1 TABLET BY MOUTH EVERY DAY 06/05/21   Midge Minium, MD  bisoprolol (ZEBETA) 5 MG tablet TAKE 1 TABLET (5 MG TOTAL) BY MOUTH DAILY. 06/05/21   Troy Sine, MD  buPROPion (WELLBUTRIN XL) 300 MG 24 hr tablet TAKE 1 TABLET BY MOUTH EVERY DAY 07/10/21   Midge Minium, MD  celecoxib (CELEBREX) 50 MG capsule Take 50 mg by mouth 2 (two) times daily.    [provider]  furosemide (LASIX) 20 MG tablet TAKE 1 TABLET BY MOUTH EVERY DAY AS  NEEDED 01/08/20   Midge Minium, MD  ibuprofen (ADVIL,MOTRIN) 200 MG tablet Take 200 mg by mouth as needed.    [provider]  JARDIANCE 10 MG TABS tablet TAKE 1 TABLET BY MOUTH EVERY DAY 05/05/21   Midge Minium, MD  omeprazole (PRILOSEC) 40 MG capsule TAKE 1 CAPSULE BY MOUTH EVERY DAY 03/28/21   Midge Minium, MD  rosuvastatin (CRESTOR) 20 MG tablet TAKE 1 TABLET BY MOUTH EVERY DAY 01/30/21   Troy Sine, MD  Semaglutide,0.25 or 0.'5MG'$ /DOS, (OZEMPIC, 0.25 OR 0.5 MG/DOSE,) 2  MG/3ML SOPN Inject 0.25 mLs into the skin once a week.    [provider]  spironolactone (ALDACTONE) 25 MG tablet TAKE 1/2 TABLET BY MOUTH TWICE A DAY 01/01/21   Troy Sine, MD  SYMBICORT 160-4.5 MCG/ACT inhaler TAKE 2 PUFFS BY MOUTH TWICE A DAY 09/24/20   Rigoberto Noel, MD  Vitamin D, Ergocalciferol, (DRISDOL) 1.25 MG (50000 UNIT) CAPS capsule Take 1 capsule (50,000 Units total) by mouth every 7 (seven) days. Patient not taking: Reported on 06/24/2021 11/30/20   Midge Minium, MD    Physical Exam    Vital Signs:  Ariana [name] does not have vital signs available for review today.  Given telephonic nature of communication, physical exam is limited. AAOx3. NAD. Normal affect.  Speech and respirations are unlabored.  Accessory Clinical Findings    None  Assessment & Plan    1.  Preoperative Cardiovascular Risk Assessment: Patient is doing well from a cardiac perspective and may proceed to surgery without further testing. According to the Revised Cardiac Risk Index (RCRI), her Perioperative Risk of Major Cardiac Event is (%): 6.6 Her Functional Capacity in METs is: 6.05 according to the Duke Activity Status Index (DASI).    A copy of this note will be routed to requesting surgeon.  Time:   Today, I have spent 10 minutes with the patient with telehealth technology discussing medical history, symptoms, and management plan.     Emmaline Life, NP-C    07/14/2021, 3:07 PM [facility] [address] Office [phone] Fax [phone]

## 2021-07-21 ENCOUNTER — Encounter: Payer: Self-pay | Admitting: Family Medicine

## 2021-07-22 NOTE — Telephone Encounter (Signed)
Surgical clearance form was faxed to [facility] . Pt is aware as well. Copy is in scan folder and I have the original.

## 2021-07-24 ENCOUNTER — Ambulatory Visit: Payer: BC Managed Care – PPO | Admitting: Family Medicine

## 2021-07-24 ENCOUNTER — Encounter: Payer: Self-pay | Admitting: Family Medicine

## 2021-07-24 VITALS — BP 128/80 | HR 51 | Temp 97.6°F | Resp 16 | Ht 63.0 in | Wt 218.4 lb

## 2021-07-24 DIAGNOSIS — R0681 Apnea, not elsewhere classified: Secondary | ICD-10-CM | POA: Insufficient documentation

## 2021-07-24 DIAGNOSIS — Z01818 Encounter for other preprocedural examination: Secondary | ICD-10-CM | POA: Diagnosis not present

## 2021-07-24 NOTE — Patient Instructions (Addendum)
Follow up as scheduled or as needed No need for labs today- yay!!! Keep up the good work on healthy diet and regular exercise- you're doing great! Call with any questions or concerns GOOD LUCK WITH SURGERY!!!

## 2021-07-24 NOTE — Progress Notes (Signed)
   Subjective:    Patient ID: Ariana [name], female    DOB: 01-17-1960, 61 y.o.   MRN: [id number]  HPI Surgical clearance- pt has TKR upcoming.  They are asking for clearance due to her underlying diabetes.  Most recent A1C 5.4% on Jardiance '10mg'$  daily and Ozempic 0.'5mg'$  weekly.  They were asking Cardiology for cardiac clearance- which she received on 07/14/21.  Pt reports feeling good.   Review of Systems For ROS see HPI     Objective:   Physical Exam Vitals reviewed.  Constitutional:      General: She is not in acute distress.    Appearance: Normal appearance. She is obese. She is not ill-appearing.  HENT:     Head: Normocephalic and atraumatic.  Eyes:     Extraocular Movements: Extraocular movements intact.     Conjunctiva/sclera: Conjunctivae normal.     Pupils: Pupils are equal, round, and reactive to light.  Cardiovascular:     Rate and Rhythm: Normal rate and regular rhythm.  Pulmonary:     Effort: Pulmonary effort is normal. No respiratory distress.     Breath sounds: No wheezing.  Skin:    General: Skin is warm and dry.  Neurological:     General: No focal deficit present.     Mental Status: She is alert and oriented to person, place, and time.  Psychiatric:        Mood and Affect: Mood normal.        Behavior: Behavior normal.        Thought Content: Thought content normal.           Assessment & Plan:   Surgical clearance- pt ok to proceed w/ planned joint replacement.  She will hold Jardiance the day of the procedure.  Forms were completed and faxed.

## 2021-08-11 NOTE — Patient Instructions (Addendum)
SURGICAL WAITING ROOM VISITATION Patients having surgery or a procedure may have no more than 2 support people in the waiting area - these visitors may rotate.   Children under the age of 46 must have an adult with them who is not the patient. If the patient needs to stay at the hospital during part of their recovery, the visitor guidelines for inpatient rooms apply. Pre-op nurse will coordinate an appropriate time for 1 support person to accompany patient in pre-op.  This support person may not rotate.    Please refer to the [facility] website for the visitor guidelines for Inpatients (after your surgery is over and you are in a regular room).    Your procedure is scheduled on: 08/25/21   Report to [facility] Main Entrance    Report to admitting at 12:40 PM   Call this number if you have problems the morning of surgery [phone]   Do not eat food :After Midnight.   After Midnight you may have the following liquids until 12:10 PM DAY OF SURGERY  Water Non-Citrus Juices (without pulp, NO RED) Carbonated Beverages Black Coffee (NO MILK/CREAM OR CREAMERS, sugar ok)  Clear Tea (NO MILK/CREAM OR CREAMERS, sugar ok) regular and decaf                             Plain Jell-O (NO RED)                                           Fruit ices (not with fruit pulp, NO RED)                                     Popsicles (NO RED)                                                               Sports drinks like Gatorade (NO RED)                 The day of surgery:  Drink ONE (1) Pre-Surgery G2 at 12:10 PM the morning of surgery. Drink in one sitting. Do not sip.  This drink was given to you during your hospital  pre-op appointment visit. Nothing else to drink after completing the  Pre-Surgery G2.          If you have questions, please contact your surgeon's office.   FOLLOW BOWEL PREP AND ANY ADDITIONAL PRE OP INSTRUCTIONS YOU RECEIVED FROM YOUR SURGEON'S OFFICE!!!     Oral  Hygiene is also important to reduce your risk of infection.                                    Remember - BRUSH YOUR TEETH THE MORNING OF SURGERY WITH YOUR REGULAR TOOTHPASTE   Do NOT smoke after Midnight   Take these medicines the morning of surgery with A SIP OF WATER: Tylenol, Inhalers, Xanax, Amlodipine, Bisoprolol, Bupropion, Omeprazole, Rosuvastatin   DO NOT TAKE ANY ORAL  DIABETIC MEDICATIONS DAY OF YOUR SURGERY  How to Manage Your Diabetes Before and After Surgery  Why is it important to control my blood sugar before and after surgery? Improving blood sugar levels before and after surgery helps healing and can limit problems. A way of improving blood sugar control is eating a healthy diet by:  Eating less sugar and carbohydrates  Increasing activity/exercise  Talking with your doctor about reaching your blood sugar goals High blood sugars (greater than 180 mg/dL) can raise your risk of infections and slow your recovery, so you will need to focus on controlling your diabetes during the weeks before surgery. Make sure that the doctor who takes care of your diabetes knows about your planned surgery including the date and location.  How do I manage my blood sugar before surgery? Check your blood sugar at least 4 times a day, starting 2 days before surgery, to make sure that the level is not too high or low. Check your blood sugar the morning of your surgery when you wake up and every 2 hours until you get to the Short Stay unit. If your blood sugar is less than 70 mg/dL, you will need to treat for low blood sugar: Do not take insulin. Treat a low blood sugar (less than 70 mg/dL) with  cup of clear juice (cranberry or apple), 4 glucose tablets, OR glucose gel. Recheck blood sugar in 15 minutes after treatment (to make sure it is greater than 70 mg/dL). If your blood sugar is not greater than 70 mg/dL on recheck, call [phone] for further instructions. Report your blood sugar to the  short stay nurse when you get to Short Stay.  If you are admitted to the hospital after surgery: Your blood sugar will be checked by the staff and you will probably be given insulin after surgery (instead of oral diabetes medicines) to make sure you have good blood sugar levels. The goal for blood sugar control after surgery is 80-180 mg/dL.   WHAT DO I DO ABOUT MY DIABETES MEDICATION?  Do not take oral diabetes medicines (pills) the morning of surgery  DO NOT TAKE Jardiance for 3 days beforesurgery  The day of surgery, do not take other diabetes injectables, including Byetta (exenatide), Bydureon (exenatide ER), Victoza (liraglutide), or Trulicity (dulaglutide).  If your CBG is greater than 220 mg/dL, you may take  of your sliding scale  (correction) dose of insulin.  Reviewed and Endorsed by [facility] Patient Education Committee, August 2015   Bring CPAP mask and tubing day of surgery.                              You may not have any metal on your body including hair pins, jewelry, and body piercing             Do not wear make-up, lotions, powders, perfumes, or deodorant  Do not wear nail polish including gel and S&S, artificial/acrylic nails, or any other type of covering on natural nails including finger and toenails. If you have artificial nails, gel coating, etc. that needs to be removed by a nail salon please have this removed prior to surgery or surgery may need to be canceled/ delayed if the surgeon/ anesthesia feels like they are unable to be safely monitored.   Do not shave  48 hours prior to surgery.    Do not bring valuables to the hospital. [facility] IS NOT  RESPONSIBLE   FOR VALUABLES.   Contacts, dentures or bridgework may not be worn into surgery.  DO NOT [place]. PHARMACY WILL DISPENSE MEDICATIONS LISTED ON YOUR MEDICATION LIST TO YOU DURING YOUR ADMISSION [place]!    Patients discharged on the day  of surgery will not be allowed to drive home.  Someone NEEDS to stay with you for the first 24 hours after anesthesia.   Special Instructions: Bring a copy of your healthcare power of attorney and living will documents         the day of surgery if you haven't scanned them before.              Please read over the following fact sheets you were given: IF YOU HAVE QUESTIONS ABOUT YOUR PRE-OP INSTRUCTIONS PLEASE CALL [facility] - Preparing for Surgery Before surgery, you can play an important role.  Because skin is not sterile, your skin needs to be as free of germs as possible.  You can reduce the number of germs on your skin by washing with CHG (chlorahexidine gluconate) soap before surgery.  CHG is an antiseptic cleaner which kills germs and bonds with the skin to continue killing germs even after washing. Please DO NOT use if you have an allergy to CHG or antibacterial soaps.  If your skin becomes reddened/irritated stop using the CHG and inform your nurse when you arrive at Short Stay. Do not shave (including legs and underarms) for at least 48 hours prior to the first CHG shower.  You may shave your face/neck.  Please follow these instructions carefully:  1.  Shower with CHG Soap the night before surgery and the  morning of surgery.  2.  If you choose to wash your hair, wash your hair first as usual with your normal  shampoo.  3.  After you shampoo, rinse your hair and body thoroughly to remove the shampoo.                             4.  Use CHG as you would any other liquid soap.  You can apply chg directly to the skin and wash.  Gently with a scrungie or clean washcloth.  5.  Apply the CHG Soap to your body ONLY FROM THE NECK DOWN.   Do   not use on face/ open                           Wound or open sores. Avoid contact with eyes, ears mouth and   genitals (private parts).                       Wash face,  Genitals (private parts) with your normal soap.              6.  Wash thoroughly, paying special attention to the area where your    surgery  will be performed.  7.  Thoroughly rinse your body with warm water from the neck down.  8.  DO NOT shower/wash with your normal soap after using and rinsing off the CHG Soap.                9.  Pat yourself dry with a clean towel.            10.  Wear  clean pajamas.            11.  Place clean sheets on your bed the night of your first shower and do not  sleep with pets. Day of Surgery : Do not apply any lotions/deodorants the morning of surgery.  Please wear clean clothes to the hospital/surgery center.  FAILURE TO FOLLOW THESE INSTRUCTIONS MAY RESULT IN THE CANCELLATION OF YOUR SURGERY  PATIENT SIGNATURE_________________________________  NURSE SIGNATURE__________________________________  ________________________________________________________________________   Adam Phenix  An incentive spirometer is a tool that can help keep your lungs clear and active. This tool measures how well you are filling your lungs with each breath. Taking long deep breaths may help reverse or decrease the chance of developing breathing (pulmonary) problems (especially infection) following: A long period of time when you are unable to move or be active. BEFORE THE PROCEDURE  If the spirometer includes an indicator to show your best effort, your nurse or respiratory therapist will set it to a desired goal. If possible, sit up straight or lean slightly forward. Try not to slouch. Hold the incentive spirometer in an upright position. INSTRUCTIONS FOR USE  Sit on the edge of your bed if possible, or sit up as far as you can in bed or on a chair. Hold the incentive spirometer in an upright position. Breathe out normally. Place the mouthpiece in your mouth and seal your lips tightly around it. Breathe in slowly and as deeply as possible, raising the piston or the ball toward the top of the column. Hold your breath for 3-5  seconds or for as long as possible. Allow the piston or ball to fall to the bottom of the column. Remove the mouthpiece from your mouth and breathe out normally. Rest for a few seconds and repeat Steps 1 through 7 at least 10 times every 1-2 hours when you are awake. Take your time and take a few normal breaths between deep breaths. The spirometer may include an indicator to show your best effort. Use the indicator as a goal to work toward during each repetition. After each set of 10 deep breaths, practice coughing to be sure your lungs are clear. If you have an incision (the cut made at the time of surgery), support your incision when coughing by placing a pillow or rolled up towels firmly against it. Once you are able to get out of bed, walk around indoors and cough well. You may stop using the incentive spirometer when instructed by your caregiver.  RISKS AND COMPLICATIONS Take your time so you do not get dizzy or light-headed. If you are in pain, you may need to take or ask for pain medication before doing incentive spirometry. It is harder to take a deep breath if you are having pain. AFTER USE Rest and breathe slowly and easily. It can be helpful to keep track of a log of your progress. Your caregiver can provide you with a simple table to help with this. If you are using the spirometer at home, follow these instructions: [place] IF:  You are having difficultly using the spirometer. You have trouble using the spirometer as often as instructed. Your pain medication is not giving enough relief while using the spirometer. You develop fever of 100.5 F (38.1 C) or higher. SEEK IMMEDIATE MEDICAL CARE IF:  You cough up bloody sputum that had not been present before. You develop fever of 102 F (38.9 C) or greater. You develop worsening pain at or near the incision  site. MAKE SURE YOU:  Understand these instructions. Will watch your condition. Will get help right away if you are  not doing well or get worse. Document Released: 05/04/2006 Document Revised: 03/16/2011 Document Reviewed: 07/05/2006 [facility] Patient Information 2014 [place], [place].   ________________________________________________________________________

## 2021-08-11 NOTE — Progress Notes (Signed)
COVID Vaccine Completed: yes x3  Date of COVID positive in last 90 days:  PCP - Annye Asa, MD Cardiologist - Shelva Majestic, MD  Cardiac clearance by Christen Bame 07/14/21 in Epic Medical clearance by Cammy Brochure 07/24/21 in Epic  Chest x-ray -  EKG - 12/20/20 Epic Stress Test -  ECHO - 08/04/17 Epic Cardiac Cath -  Pacemaker/ICD device last checked: Spinal Cord Stimulator:  Bowel Prep -   Sleep Study -  CPAP -   Fasting Blood Sugar -  Checks Blood Sugar _____ times a day  Blood Thinner Instructions: Aspirin Instructions: Last Dose:  Activity level:  Can go up a flight of stairs and perform activities of daily living without stopping and without symptoms of chest pain or shortness of breath.  Able to exercise without symptoms  Unable to go up a flight of stairs without symptoms of     Anesthesia review: CAD, DM2, HF, SOB, HTN  Patient denies shortness of breath, fever, cough and chest pain at PAT appointment  Patient verbalized understanding of instructions that were given to them at the PAT appointment. Patient was also instructed that they will need to review over the PAT instructions again at home before surgery.

## 2021-08-12 ENCOUNTER — Encounter (HOSPITAL_COMMUNITY)
Admission: RE | Admit: 2021-08-12 | Discharge: 2021-08-12 | Disposition: A | Discharge status: Home / Self Care | Payer: BC Managed Care – PPO | Source: Ambulatory Visit | Attending: Orthopedic Surgery | Admitting: Orthopedic Surgery

## 2021-08-12 ENCOUNTER — Encounter (HOSPITAL_COMMUNITY): Payer: Self-pay

## 2021-08-12 VITALS — BP 117/61 | HR 62 | Temp 98.0°F | Resp 18 | Ht 63.0 in | Wt 217.0 lb

## 2021-08-12 DIAGNOSIS — G4733 Obstructive sleep apnea (adult) (pediatric): Secondary | ICD-10-CM | POA: Diagnosis not present

## 2021-08-12 DIAGNOSIS — Z01812 Encounter for preprocedural laboratory examination: Secondary | ICD-10-CM | POA: Insufficient documentation

## 2021-08-12 DIAGNOSIS — E119 Type 2 diabetes mellitus without complications: Secondary | ICD-10-CM | POA: Insufficient documentation

## 2021-08-12 DIAGNOSIS — I2510 Atherosclerotic heart disease of native coronary artery without angina pectoris: Secondary | ICD-10-CM | POA: Diagnosis not present

## 2021-08-12 DIAGNOSIS — M1711 Unilateral primary osteoarthritis, right knee: Secondary | ICD-10-CM | POA: Diagnosis not present

## 2021-08-12 DIAGNOSIS — Z01818 Encounter for other preprocedural examination: Secondary | ICD-10-CM

## 2021-08-12 DIAGNOSIS — I110 Hypertensive heart disease with heart failure: Secondary | ICD-10-CM | POA: Diagnosis not present

## 2021-08-12 DIAGNOSIS — I5032 Chronic diastolic (congestive) heart failure: Secondary | ICD-10-CM | POA: Insufficient documentation

## 2021-08-12 HISTORY — DX: Type 2 diabetes mellitus without complications: E11.9

## 2021-08-12 HISTORY — DX: Atherosclerotic heart disease of native coronary artery without angina pectoris: I25.10

## 2021-08-12 HISTORY — DX: Gastro-esophageal reflux disease without esophagitis: K21.9

## 2021-08-12 HISTORY — DX: Dyspnea, unspecified: R06.00

## 2021-08-12 LAB — BASIC METABOLIC PANEL
Anion gap: 8 (ref 5–15)
BUN: 15 mg/dL (ref 8–23)
CO2: 24 mmol/L (ref 22–32)
Calcium: 9.3 mg/dL (ref 8.9–10.3)
Chloride: 108 mmol/L (ref 98–111)
Creatinine, Ser: 0.95 mg/dL (ref 0.44–1.00)
GFR, Estimated: >60 mL/min (ref >60)
Glucose, Bld: 88 mg/dL (ref 70–99)
Potassium: 3.5 mmol/L (ref 3.5–5.1)
Sodium: 140 mmol/L (ref 135–145)

## 2021-08-12 LAB — CBC
HCT: 41.5 % (ref 36.0–46.0)
Hemoglobin: 12.9 g/dL (ref 12.0–15.0)
MCH: 25 pg — ABNORMAL LOW (ref 26.0–34.0)
MCHC: 31.1 g/dL (ref 30.0–36.0)
MCV: 80.4 fL (ref 80.0–100.0)
Platelets: 228 10*3/uL (ref 150–400)
RBC: 5.16 MIL/uL — ABNORMAL HIGH (ref 3.87–5.11)
RDW: 16.9 % — ABNORMAL HIGH (ref 11.5–15.5)
WBC: 8.4 10*3/uL (ref 4.0–10.5)
nRBC: 0 % (ref 0.0–0.2)

## 2021-08-12 LAB — SURGICAL PCR SCREEN
MRSA, PCR: NEGATIVE
Staphylococcus aureus: NEGATIVE

## 2021-08-12 LAB — GLUCOSE, CAPILLARY: Glucose-Capillary: 98 mg/dL (ref 70–99)

## 2021-08-12 LAB — HEMOGLOBIN A1C
Hgb A1c MFr Bld: 5.2 % (ref 4.8–5.6)
Mean Plasma Glucose: 102.54 mg/dL

## 2021-08-13 ENCOUNTER — Encounter (HOSPITAL_COMMUNITY): Payer: Self-pay

## 2021-08-13 NOTE — Anesthesia Preprocedure Evaluation (Addendum)
Anesthesia Evaluation  Patient identified by MRN, date of birth, ID band Patient awake    Reviewed: Allergy & Precautions, NPO status , Patient's Chart, lab work & pertinent test results  Airway Mallampati: II  TM Distance: >3 FB Neck ROM: Full    Dental  (+) Dental Advisory Given, Partial Upper, Partial Lower   Pulmonary asthma , former smoker,    Pulmonary exam normal breath sounds clear to auscultation       Cardiovascular hypertension, Pt. on medications and Pt. on home beta blockers (-) angina+ CAD  (-) Past MI Normal cardiovascular exam Rhythm:Regular Rate:Normal     Neuro/Psych PSYCHIATRIC DISORDERS Anxiety Depression negative neurological ROS     GI/Hepatic Neg liver ROS, GERD  Medicated,  Endo/Other  diabetes, Type 2, Oral Hypoglycemic AgentsObesity   Renal/GU negative Renal ROS     Musculoskeletal  (+) Arthritis , Osteoarthritis,    Abdominal   Peds  Hematology negative hematology ROS (+) Plt 228k   Anesthesia Other Findings   Reproductive/Obstetrics                           Anesthesia Physical Anesthesia Plan  ASA: 3  Anesthesia Plan: Spinal   Post-op Pain Management: Regional block* and Tylenol PO (pre-op)*   Induction: Intravenous  PONV Risk Score and Plan: 2 and Midazolam, Dexamethasone, TIVA and Ondansetron  Airway Management Planned: Natural Airway and Nasal Cannula  Additional Equipment:   Intra-op Plan:   Post-operative Plan:   Informed Consent: I have reviewed the patients History and Physical, chart, labs and discussed the procedure including the risks, benefits and alternatives for the proposed anesthesia with the patient or authorized representative who has indicated his/her understanding and acceptance.     Dental advisory given  Plan Discussed with: CRNA  Anesthesia Plan Comments: (See APP note by Joslyn Hy, FNP )      Anesthesia Quick  Evaluation

## 2021-08-13 NOTE — Progress Notes (Signed)
Anesthesia Chart Review:   Case: [id number] Date/Time: 08/25/21 1256   Procedure: TOTAL KNEE ARTHROPLASTY (Right: Knee)   Anesthesia type: Spinal   Pre-op diagnosis: OA RIGHT KNEE   Location: [facility] ROOM 08 / [facility]   Surgeons: Willaim Sheng, MD       DISCUSSION: Pt is 60 years old with hx CAD (by CT), HFpEF, HTN, DM, OSA  VS: BP 117/61   Pulse 62   Temp 36.7 C (Oral)   Resp 18   Ht '5\' 3"'$  (1.6 m)   Wt 98.4 kg   SpO2 98%   BMI 38.44 kg/m   PROVIDERS: - PCP is Midge Minium, MD. Cleared for surgery at last office visit 07/24/21 - Cardiologist is Shelva Majestic, MD. Cleared for surgery at last telephone visit by Christen Bame, NP 07/14/21  LABS: Labs reviewed: Acceptable for surgery. (all labs ordered are listed, but only abnormal results are displayed)  Labs Reviewed  CBC - Abnormal; Notable for the following components:      Result Value   RBC 5.16 (*)    MCH 25.0 (*)    RDW 16.9 (*)    All other components within normal limits  SURGICAL PCR SCREEN  GLUCOSE, CAPILLARY  HEMOGLOBIN G2E  BASIC METABOLIC PANEL    EKG 36/62/94: NSR. nonspecific ST abnormalities   CV: CT coronary morphology 12/07/17:  1. Coronary calcium score of 3.89 This was 69th percentile for age and sex matched control. 2. Normal coronary origin with right dominance. 3. No evidence of obstructive CAD.  Echo 08/04/17:  - Left ventricle: The cavity size was normal. Wall thickness was normal. Systolic function was normal. The estimated ejection fraction was in the range of 60% to 65%. Wall motion was normal; there were no regional wall motion abnormalities. Features are consistent with a pseudonormal left ventricular filling pattern, with concomitant abnormal relaxation and increased filling pressure (grade 2 diastolic dysfunction).     Past Medical History:  Diagnosis Date   Anxiety    Coronary artery disease    on CT   Depression    Diabetes mellitus without complication (HCC)     Dyspnea    GERD (gastroesophageal reflux disease)    Hyperlipidemia    Hypertension    OA (osteoarthritis)    in Bil knees/ gets steroid injections for pain   Ovarian cyst    right ovary/had ablation    Past Surgical History:  Procedure Laterality Date   COLONOSCOPY     ENDOMETRIAL ABLATION  2012   OVARIAN CYST REMOVAL Right 2012   WISDOM TOOTH EXTRACTION      MEDICATIONS:  acetaminophen (TYLENOL) 500 MG tablet   albuterol (VENTOLIN HFA) 108 (90 Base) MCG/ACT inhaler   ALPRAZolam (XANAX) 1 MG tablet   amLODipine (NORVASC) 5 MG tablet   bisoprolol (ZEBETA) 5 MG tablet   buPROPion (WELLBUTRIN XL) 300 MG 24 hr tablet   celecoxib (CELEBREX) 100 MG capsule   furosemide (LASIX) 20 MG tablet   ibuprofen (ADVIL,MOTRIN) 200 MG tablet   JARDIANCE 10 MG TABS tablet   nystatin-triamcinolone ointment (MYCOLOG)   omeprazole (PRILOSEC) 40 MG capsule   rosuvastatin (CRESTOR) 20 MG tablet   Semaglutide,0.25 or 0.'5MG'$ /DOS, (OZEMPIC, 0.25 OR 0.5 MG/DOSE,) 2 MG/3ML SOPN   spironolactone (ALDACTONE) 25 MG tablet   No current facility-administered medications for this encounter.    If no changes, I anticipate pt can proceed with surgery as scheduled.   Willeen Cass, PhD, FNP-BC [facility] Short Stay Surgical [facility]/Anesthesiology Phone: (  [phone] 08/13/2021 12:28 PM

## 2021-08-15 NOTE — H&P (Signed)
KNEE ARTHROPLASTY ADMISSION H&P  Patient ID: Ariana [name] MRN: [id number] DOB/AGE: 1960/08/24 61 y.o.  Chief Complaint: right knee pain.  Planned Procedure Date: 08/25/21 Medical Clearance by Dr. Leandrew Koyanagi Cardiac Clearance by Dr. Claiborne Billings  HPI: Ariana [name] is a 61 y.o. female who presents for evaluation of OA RIGHT KNEE. The patient has a history of pain and functional disability in the right knee due to arthritis and has failed non-surgical conservative treatments for greater than 12 weeks to include NSAID's and/or analgesics, corticosteriod injections, viscosupplementation injections, weight reduction as appropriate, and activity modification.  Onset of symptoms was gradual, starting >10 years ago with gradually worsening course since that time. The patient noted no past surgery on the right knee.  Patient currently rates pain at 7 out of 10 with activity. Patient has worsening of pain with activity and weight bearing and pain that interferes with activities of daily living.  Patient has evidence of joint space narrowing by imaging studies.  There is no active infection.  Past Medical History:  Diagnosis Date   Anxiety    Coronary artery disease    on CT   Depression    Diabetes mellitus without complication (HCC)    Dyspnea    GERD (gastroesophageal reflux disease)    Hyperlipidemia    Hypertension    OA (osteoarthritis)    in Bil knees/ gets steroid injections for pain   Ovarian cyst    right ovary/had ablation   Past Surgical History:  Procedure Laterality Date   COLONOSCOPY     ENDOMETRIAL ABLATION  2012   OVARIAN CYST REMOVAL Right 2012   WISDOM TOOTH EXTRACTION     Allergies  Allergen Reactions   Penicillins Hives and Rash   Prior to Admission medications   Medication Sig Start Date End Date Taking? Authorizing Provider  acetaminophen (TYLENOL) 500 MG tablet Take 1,000 mg by mouth every 8 (eight) hours as needed for moderate pain.   Yes [provider]   albuterol (VENTOLIN HFA) 108 (90 Base) MCG/ACT inhaler Inhale 2 puffs into the lungs every 6 (six) hours as needed for wheezing or shortness of breath. 01/19/19  Yes Rigoberto Noel, MD  ALPRAZolam Duanne Moron) 1 MG tablet TAKE 1/2 TABLET BY MOUTH TWICE A DAY AS NEEDED FOR ANXIETY 04/17/21  Yes Maximiano Coss, NP  amLODipine (NORVASC) 5 MG tablet TAKE 1 TABLET BY MOUTH EVERY DAY 06/05/21  Yes Midge Minium, MD  bisoprolol (ZEBETA) 5 MG tablet TAKE 1 TABLET (5 MG TOTAL) BY MOUTH DAILY. 06/05/21  Yes Troy Sine, MD  buPROPion (WELLBUTRIN XL) 300 MG 24 hr tablet TAKE 1 TABLET BY MOUTH EVERY DAY 07/10/21  Yes Midge Minium, MD  celecoxib (CELEBREX) 100 MG capsule Take 100 mg by mouth daily.   Yes [provider]  furosemide (LASIX) 20 MG tablet TAKE 1 TABLET BY MOUTH EVERY DAY AS NEEDED 01/08/20  Yes Midge Minium, MD  ibuprofen (ADVIL,MOTRIN) 200 MG tablet Take 400 mg by mouth every 8 (eight) hours as needed for moderate pain.   Yes [provider]  JARDIANCE 10 MG TABS tablet TAKE 1 TABLET BY MOUTH EVERY DAY 05/05/21  Yes Midge Minium, MD  nystatin-triamcinolone ointment [facility]) Apply 1 Application topically 2 (two) times daily as needed (yeast infections).   Yes [provider]  omeprazole (PRILOSEC) 40 MG capsule TAKE 1 CAPSULE BY MOUTH EVERY DAY 03/28/21  Yes Midge Minium, MD  rosuvastatin (CRESTOR) 20 MG tablet TAKE 1  TABLET BY MOUTH EVERY DAY 01/30/21  Yes Troy Sine, MD  Semaglutide,0.25 or 0.'5MG'$ /DOS, (OZEMPIC, 0.25 OR 0.5 MG/DOSE,) 2 MG/3ML SOPN Inject 0.5 mg into the skin every Monday.   Yes [provider]  spironolactone (ALDACTONE) 25 MG tablet TAKE 1/2 TABLET BY MOUTH TWICE A DAY 01/01/21  Yes Troy Sine, MD   Social History   Socioeconomic History   Marital status: Divorced    Spouse name: Not on file   Number of children: Not on file   Years of education: Not on file   Highest education level: Not on file   Occupational History   Not on file  Tobacco Use   Smoking status: Former    Types: Cigarettes    Quit date: 01/18/1981    Years since quitting: 40.6   Smokeless tobacco: Never   Tobacco comments:    pt states she smoker for 3 years in high school (10/28/17)  Vaping Use   Vaping Use: Never used  Substance and Sexual Activity   Alcohol use: No   Drug use: No   Sexual activity: Never  Other Topics Concern   Not on file  Social History Narrative   Not on file   Social Determinants of Health   Financial Resource Strain: Not on file  Food Insecurity: Not on file  Transportation Needs: Not on file  Physical Activity: Not on file  Stress: Not on file  Social Connections: Not on file   Family History  Problem Relation Age of Onset   Dementia Mother    Heart attack Father    Heart disease Father    Arthritis Father    Hyperlipidemia Father    Hypertension Father    Diabetes Brother    Arthritis Brother    Heart disease Brother    Liver disease Brother    Cancer Maternal Aunt     ROS: Currently denies lightheadedness, dizziness, Fever, chills, CP, SOB.   No personal history of DVT, PE, MI, or CVA. No loose teeth or dentures All other systems have been reviewed and were otherwise currently negative with the exception of those mentioned in the HPI and as above.  Objective: Vitals: Ht: '5\' 3"'$  Wt: 217 lbs Temp: 97.6 BP: 115/76 Pulse: 66 O2 93% on room air.   Physical Exam: General: Alert, NAD.  Antalgic Gait  HEENT: EOMI, Good Neck Extension  Pulm: No increased work of breathing.  Clear B/L A/P w/o crackle or wheeze.  CV: RRR, No m/g/r appreciated  GI: soft, NT, ND Neuro: Neuro without gross focal deficit.  Sensation intact distally Skin: No lesions in the area of chief complaint MSK/Surgical Site: right knee w/o redness or effusion.  no JLT. ROM 0-110.  5/5 strength in extension and flexion.  +EHL/FHL.  NVI.  Stable varus and valgus stress.    Imaging Review Plain  radiographs demonstrate severe degenerative joint disease of the right knee.   Preoperative templating of the joint replacement has been completed, documented, and submitted to the Operating Room personnel in order to optimize intra-operative equipment management.  Assessment: OA RIGHT KNEE    Plan: Plan for Procedure(s): TOTAL KNEE ARTHROPLASTY  The patient history, physical exam, clinical judgement of the provider and imaging are consistent with end stage degenerative joint disease and total joint arthroplasty is deemed medically necessary. The treatment options including medical management, injection therapy, and arthroplasty were discussed at length. The risks and benefits of Procedure(s): TOTAL KNEE ARTHROPLASTY were presented and reviewed.  The  risks of nonoperative treatment, versus surgical intervention including but not limited to continued pain, aseptic loosening, stiffness, dislocation/subluxation, infection, bleeding, nerve injury, blood clots, cardiopulmonary complications, morbidity, mortality, among others were discussed. The patient verbalizes understanding and wishes to proceed with the plan.  Patient is being admitted for inpatient treatment for surgery, pain control, PT, prophylactic antibiotics, VTE prophylaxis, progressive ambulation, ADL's and discharge planning.   Dental prophylaxis discussed and recommended for 2 years postoperatively.  The patient does  meet the criteria for TXA which will be used perioperatively.   ASA 81 mg BID  will be used postoperatively for DVT prophylaxis in addition to SCDs, and early ambulation. The patient is planning to be discharged home with OPPT in care of her brother  Jola Baptist 08/15/2021 3:37 PM

## 2021-08-25 ENCOUNTER — Ambulatory Visit (HOSPITAL_COMMUNITY): Payer: BC Managed Care – PPO | Admitting: Anesthesiology

## 2021-08-25 ENCOUNTER — Encounter (HOSPITAL_COMMUNITY): Payer: Self-pay | Admitting: Orthopedic Surgery

## 2021-08-25 ENCOUNTER — Other Ambulatory Visit: Payer: Self-pay

## 2021-08-25 ENCOUNTER — Encounter (HOSPITAL_COMMUNITY)
Admission: RE | Disposition: A | Discharge status: Home / Self Care | Payer: Self-pay | Source: Ambulatory Visit | Attending: Orthopedic Surgery

## 2021-08-25 ENCOUNTER — Ambulatory Visit (HOSPITAL_COMMUNITY): Payer: BC Managed Care – PPO | Admitting: Emergency Medicine

## 2021-08-25 ENCOUNTER — Ambulatory Visit (HOSPITAL_COMMUNITY)
Admission: RE | Admit: 2021-08-25 | Discharge: 2021-08-25 | Disposition: A | Discharge status: Home / Self Care | Payer: BC Managed Care – PPO | Source: Ambulatory Visit | Attending: Orthopedic Surgery | Admitting: Orthopedic Surgery

## 2021-08-25 ENCOUNTER — Ambulatory Visit (HOSPITAL_COMMUNITY): Payer: BC Managed Care – PPO

## 2021-08-25 DIAGNOSIS — Z7984 Long term (current) use of oral hypoglycemic drugs: Secondary | ICD-10-CM | POA: Diagnosis not present

## 2021-08-25 DIAGNOSIS — K219 Gastro-esophageal reflux disease without esophagitis: Secondary | ICD-10-CM | POA: Diagnosis not present

## 2021-08-25 DIAGNOSIS — Z87891 Personal history of nicotine dependence: Secondary | ICD-10-CM | POA: Diagnosis not present

## 2021-08-25 DIAGNOSIS — F418 Other specified anxiety disorders: Secondary | ICD-10-CM | POA: Insufficient documentation

## 2021-08-25 DIAGNOSIS — J45909 Unspecified asthma, uncomplicated: Secondary | ICD-10-CM | POA: Insufficient documentation

## 2021-08-25 DIAGNOSIS — I10 Essential (primary) hypertension: Secondary | ICD-10-CM | POA: Insufficient documentation

## 2021-08-25 DIAGNOSIS — I2510 Atherosclerotic heart disease of native coronary artery without angina pectoris: Secondary | ICD-10-CM | POA: Diagnosis not present

## 2021-08-25 DIAGNOSIS — E119 Type 2 diabetes mellitus without complications: Secondary | ICD-10-CM | POA: Diagnosis not present

## 2021-08-25 DIAGNOSIS — M1711 Unilateral primary osteoarthritis, right knee: Secondary | ICD-10-CM | POA: Diagnosis present

## 2021-08-25 HISTORY — PX: TOTAL KNEE ARTHROPLASTY: SHX125

## 2021-08-25 LAB — GLUCOSE, CAPILLARY
Glucose-Capillary: 76 mg/dL (ref 70–99)
Glucose-Capillary: 93 mg/dL (ref 70–99)

## 2021-08-25 SURGERY — ARTHROPLASTY, KNEE, TOTAL
Anesthesia: Spinal | Site: Knee | Laterality: Right

## 2021-08-25 MED ORDER — KETOROLAC TROMETHAMINE 15 MG/ML IJ SOLN
INTRAMUSCULAR | Status: AC
  Filled 2021-08-25: qty 1

## 2021-08-25 MED ORDER — FENTANYL CITRATE (PF) 100 MCG/2ML IJ SOLN
INTRAMUSCULAR | Status: AC
  Filled 2021-08-25: qty 2

## 2021-08-25 MED ORDER — CEFAZOLIN SODIUM-DEXTROSE 2-4 GM/100ML-% IV SOLN
2.0000 g | INTRAVENOUS | Status: AC
  Administered 2021-08-25: 2 g via INTRAVENOUS
  Filled 2021-08-25: qty 100

## 2021-08-25 MED ORDER — METHOCARBAMOL 500 MG PO TABS
ORAL_TABLET | ORAL | Status: AC
  Administered 2021-08-25: 500 mg via ORAL
  Filled 2021-08-25: qty 1

## 2021-08-25 MED ORDER — CHLORHEXIDINE GLUCONATE 0.12 % MT SOLN
15.0000 mL | Freq: Once | OROMUCOSAL | Status: AC
  Administered 2021-08-25: 15 mL via OROMUCOSAL

## 2021-08-25 MED ORDER — TRANEXAMIC ACID-NACL 1000-0.7 MG/100ML-% IV SOLN
1000.0000 mg | INTRAVENOUS | Status: AC
  Administered 2021-08-25: 1000 mg via INTRAVENOUS
  Filled 2021-08-25: qty 100

## 2021-08-25 MED ORDER — BUPIVACAINE LIPOSOME 1.3 % IJ SUSP: 20.0000 mL | Freq: Once | INTRAMUSCULAR | Status: DC

## 2021-08-25 MED ORDER — PHENYLEPHRINE HCL (PRESSORS) 10 MG/ML IV SOLN
INTRAVENOUS | Status: DC | PRN
  Administered 2021-08-25: 80 ug via INTRAVENOUS
  Administered 2021-08-25: 160 ug via INTRAVENOUS

## 2021-08-25 MED ORDER — KETOROLAC TROMETHAMINE 15 MG/ML IJ SOLN
7.5000 mg | Freq: Four times a day (QID) | INTRAMUSCULAR | Status: DC
  Administered 2021-08-25: 7.5 mg via INTRAVENOUS

## 2021-08-25 MED ORDER — ONDANSETRON HCL 4 MG/2ML IJ SOLN
INTRAMUSCULAR | Status: DC | PRN
  Administered 2021-08-25: 4 mg via INTRAVENOUS

## 2021-08-25 MED ORDER — CELECOXIB 200 MG PO CAPS
400.0000 mg | ORAL_CAPSULE | Freq: Once | ORAL | Status: AC
  Administered 2021-08-25: 400 mg via ORAL
  Filled 2021-08-25: qty 2

## 2021-08-25 MED ORDER — POVIDONE-IODINE 10 % EX SWAB
2.0000 | Freq: Once | CUTANEOUS | Status: AC
  Administered 2021-08-25: 2 via TOPICAL

## 2021-08-25 MED ORDER — LACTATED RINGERS IV BOLUS
250.0000 mL | Freq: Once | INTRAVENOUS | Status: AC
  Administered 2021-08-25: 250 mL via INTRAVENOUS

## 2021-08-25 MED ORDER — CEFAZOLIN SODIUM-DEXTROSE 2-4 GM/100ML-% IV SOLN: 2.0000 g | Freq: Four times a day (QID) | INTRAVENOUS | Status: DC

## 2021-08-25 MED ORDER — DEXAMETHASONE SODIUM PHOSPHATE 10 MG/ML IJ SOLN
INTRAMUSCULAR | Status: AC
  Filled 2021-08-25: qty 1

## 2021-08-25 MED ORDER — ACETAMINOPHEN 500 MG PO TABS
1000.0000 mg | ORAL_TABLET | Freq: Four times a day (QID) | ORAL | Status: DC
  Administered 2021-08-25: 1000 mg via ORAL

## 2021-08-25 MED ORDER — ACETAMINOPHEN 500 MG PO TABS
1000.0000 mg | ORAL_TABLET | Freq: Once | ORAL | Status: AC
  Administered 2021-08-25: 1000 mg via ORAL
  Filled 2021-08-25: qty 2

## 2021-08-25 MED ORDER — BUPIVACAINE LIPOSOME 1.3 % IJ SUSP
INTRAMUSCULAR | Status: DC | PRN
  Administered 2021-08-25: 20 mL

## 2021-08-25 MED ORDER — SODIUM CHLORIDE 0.9% FLUSH
INTRAVENOUS | Status: DC | PRN
  Administered 2021-08-25: 60 mL

## 2021-08-25 MED ORDER — LACTATED RINGERS IV SOLN: INTRAVENOUS | Status: DC

## 2021-08-25 MED ORDER — BUPIVACAINE LIPOSOME 1.3 % IJ SUSP
INTRAMUSCULAR | Status: AC
  Filled 2021-08-25: qty 20

## 2021-08-25 MED ORDER — FENTANYL CITRATE PF 50 MCG/ML IJ SOSY: 25.0000 ug | PREFILLED_SYRINGE | INTRAMUSCULAR | Status: DC | PRN

## 2021-08-25 MED ORDER — ROPIVACAINE HCL 5 MG/ML IJ SOLN
INTRAMUSCULAR | Status: DC | PRN
  Administered 2021-08-25: 25 mL via PERINEURAL

## 2021-08-25 MED ORDER — ASPIRIN 81 MG PO TBEC: 81.0000 mg | DELAYED_RELEASE_TABLET | Freq: Two times a day (BID) | ORAL | 0 refills | Status: AC

## 2021-08-25 MED ORDER — OXYCODONE HCL 5 MG PO TABS: 5.0000 mg | ORAL_TABLET | ORAL | 0 refills | Status: AC | PRN

## 2021-08-25 MED ORDER — SODIUM CHLORIDE (PF) 0.9 % IJ SOLN
INTRAMUSCULAR | Status: AC
  Filled 2021-08-25: qty 50

## 2021-08-25 MED ORDER — LACTATED RINGERS IV BOLUS
500.0000 mL | Freq: Once | INTRAVENOUS | Status: AC
  Administered 2021-08-25: 500 mL via INTRAVENOUS

## 2021-08-25 MED ORDER — ISOPROPYL ALCOHOL 70 % SOLN
Status: AC
  Filled 2021-08-25: qty 480

## 2021-08-25 MED ORDER — ACETAMINOPHEN 500 MG PO TABS
ORAL_TABLET | ORAL | Status: AC
  Filled 2021-08-25: qty 2

## 2021-08-25 MED ORDER — POVIDONE-IODINE 7.5 % EX SOLN: Freq: Once | CUTANEOUS | Status: DC

## 2021-08-25 MED ORDER — METHOCARBAMOL 500 MG PO TABS: 500.0000 mg | ORAL_TABLET | Freq: Four times a day (QID) | ORAL | Status: DC | PRN

## 2021-08-25 MED ORDER — PHENYLEPHRINE HCL-NACL 20-0.9 MG/250ML-% IV SOLN
INTRAVENOUS | Status: AC
  Filled 2021-08-25: qty 250

## 2021-08-25 MED ORDER — DEXAMETHASONE SODIUM PHOSPHATE 10 MG/ML IJ SOLN
INTRAMUSCULAR | Status: DC | PRN
  Administered 2021-08-25: 8 mg via INTRAVENOUS

## 2021-08-25 MED ORDER — MIDAZOLAM HCL 2 MG/2ML IJ SOLN
INTRAMUSCULAR | Status: AC
  Filled 2021-08-25: qty 2

## 2021-08-25 MED ORDER — CEFAZOLIN SODIUM-DEXTROSE 2-4 GM/100ML-% IV SOLN
INTRAVENOUS | Status: AC
  Administered 2021-08-25: 2 g via INTRAVENOUS
  Filled 2021-08-25: qty 100

## 2021-08-25 MED ORDER — ORAL CARE MOUTH RINSE: 15.0000 mL | Freq: Once | OROMUCOSAL | Status: AC

## 2021-08-25 MED ORDER — WATER FOR IRRIGATION, STERILE IR SOLN
Status: DC | PRN
  Administered 2021-08-25: 2000 mL

## 2021-08-25 MED ORDER — POVIDONE-IODINE 10 % EX SWAB: 2.0000 | Freq: Once | CUTANEOUS | Status: DC

## 2021-08-25 MED ORDER — OXYCODONE HCL 5 MG PO TABS
ORAL_TABLET | ORAL | Status: AC
  Administered 2021-08-25: 5 mg via ORAL
  Filled 2021-08-25: qty 1

## 2021-08-25 MED ORDER — METHOCARBAMOL 500 MG PO TABS: 500.0000 mg | ORAL_TABLET | Freq: Three times a day (TID) | ORAL | 0 refills | Status: AC | PRN

## 2021-08-25 MED ORDER — ONDANSETRON HCL 4 MG/2ML IJ SOLN: 4.0000 mg | Freq: Once | INTRAMUSCULAR | Status: DC | PRN

## 2021-08-25 MED ORDER — SODIUM CHLORIDE 0.9 % IR SOLN
Status: DC | PRN
  Administered 2021-08-25: 3000 mL

## 2021-08-25 MED ORDER — 0.9 % SODIUM CHLORIDE (POUR BTL) OPTIME
TOPICAL | Status: DC | PRN
  Administered 2021-08-25: 1000 mL

## 2021-08-25 MED ORDER — METHOCARBAMOL 500 MG IVPB - SIMPLE MED: 500.0000 mg | Freq: Four times a day (QID) | INTRAVENOUS | Status: DC | PRN

## 2021-08-25 MED ORDER — ONDANSETRON HCL 4 MG/2ML IJ SOLN
INTRAMUSCULAR | Status: AC
  Filled 2021-08-25: qty 2

## 2021-08-25 MED ORDER — FENTANYL CITRATE (PF) 100 MCG/2ML IJ SOLN
INTRAMUSCULAR | Status: DC | PRN
  Administered 2021-08-25 (×2): 50 ug via INTRAVENOUS

## 2021-08-25 MED ORDER — ONDANSETRON HCL 4 MG PO TABS: 4.0000 mg | ORAL_TABLET | Freq: Three times a day (TID) | ORAL | 0 refills | Status: AC | PRN

## 2021-08-25 MED ORDER — SODIUM CHLORIDE (PF) 0.9 % IJ SOLN
INTRAMUSCULAR | Status: AC
  Filled 2021-08-25: qty 10

## 2021-08-25 MED ORDER — SURGIRINSE WOUND IRRIGATION SYSTEM - OPTIME: TOPICAL | Status: DC | PRN

## 2021-08-25 MED ORDER — ISOPROPYL ALCOHOL 70 % SOLN
Status: DC | PRN
  Administered 2021-08-25: 1 via TOPICAL

## 2021-08-25 MED ORDER — OXYCODONE HCL 5 MG PO TABS: 5.0000 mg | ORAL_TABLET | ORAL | Status: DC | PRN

## 2021-08-25 MED ORDER — PROPOFOL 500 MG/50ML IV EMUL
INTRAVENOUS | Status: DC | PRN
  Administered 2021-08-25: 50 ug/kg/min via INTRAVENOUS

## 2021-08-25 MED ORDER — MIDAZOLAM HCL 5 MG/5ML IJ SOLN
INTRAMUSCULAR | Status: DC | PRN
  Administered 2021-08-25: 2 mg via INTRAVENOUS

## 2021-08-25 MED ORDER — BUPIVACAINE IN DEXTROSE 0.75-8.25 % IT SOLN
INTRATHECAL | Status: DC | PRN
  Administered 2021-08-25: 1.8 mL via INTRATHECAL

## 2021-08-25 SURGICAL SUPPLY — 63 items
ACE 6X15 IMPLANT
BAG COUNTER SPONGE SURGICOUNT (BAG) IMPLANT
BLADE SAG 18X100X1.27 (BLADE) ×1 IMPLANT
BLADE SAW SAG 35X64 .89 (BLADE) ×1 IMPLANT
BNDG COHESIVE 3X5 TAN ST LF (GAUZE/BANDAGES/DRESSINGS) ×1 IMPLANT
BNDG ELASTIC 6X10 VLCR STRL LF (GAUZE/BANDAGES/DRESSINGS) ×1 IMPLANT
BOWL SMART MIX CTS (DISPOSABLE) ×1 IMPLANT
CEMENT BONE REFOBACIN R1X40 US (Cement) IMPLANT
CHLORAPREP W/TINT 26 (MISCELLANEOUS) ×2 IMPLANT
COMP FEM CEMT PERS SZ7 RT (Joint) ×1 IMPLANT
COMPONENT FEM CEMT PERS SZ7 RT (Joint) IMPLANT
COVER SURGICAL LIGHT HANDLE (MISCELLANEOUS) ×1 IMPLANT
CUFF TOURN SGL QUICK 34 (TOURNIQUET CUFF) ×1
CUFF TRNQT CYL 34X4.125X (TOURNIQUET CUFF) ×1 IMPLANT
DERMABOND ADVANCED (GAUZE/BANDAGES/DRESSINGS) ×1
DERMABOND ADVANCED .7 DNX12 (GAUZE/BANDAGES/DRESSINGS) ×1 IMPLANT
DRAPE INCISE IOBAN 85X60 (DRAPES) ×1 IMPLANT
DRAPE SHEET LG 3/4 BI-LAMINATE (DRAPES) ×1 IMPLANT
DRAPE U-SHAPE 47X51 STRL (DRAPES) ×1 IMPLANT
DRESSING AQUACEL AG SP 3.5X10 (GAUZE/BANDAGES/DRESSINGS) ×1 IMPLANT
DRSG AQUACEL AG SP 3.5X10 (GAUZE/BANDAGES/DRESSINGS) ×1
ELECT REM PT RETURN 15FT ADLT (MISCELLANEOUS) ×1 IMPLANT
GAUZE SPONGE 4X4 12PLY STRL (GAUZE/BANDAGES/DRESSINGS) ×1 IMPLANT
GLOVE BIO SURGEON STRL SZ 6.5 (GLOVE) IMPLANT
GLOVE BIO SURGEON STRL SZ7 (GLOVE) IMPLANT
GLOVE BIOGEL M STRL SZ7.5 (GLOVE) IMPLANT
GLOVE BIOGEL PI IND STRL 8 (GLOVE) ×1 IMPLANT
GLOVE BIOGEL PI INDICATOR 8 (GLOVE) ×3
GLOVE SURG ORTHO 8.0 STRL STRW (GLOVE) ×2 IMPLANT
GOWN STRL REUS W/ TWL XL LVL3 (GOWN DISPOSABLE) ×1 IMPLANT
GOWN STRL REUS W/TWL XL LVL3 (GOWN DISPOSABLE) ×3
HANDPIECE INTERPULSE COAX TIP (DISPOSABLE) ×1
HDLS TROCR DRIL PIN KNEE 75 (PIN) ×1
HOLDER FOLEY CATH W/STRAP (MISCELLANEOUS) ×1 IMPLANT
HOOD PEEL AWAY FLYTE STAYCOOL (MISCELLANEOUS) ×3 IMPLANT
INSERT TIB ASF VIV SZ 6-7 12H (Insert) IMPLANT
MANIFOLD NEPTUNE II (INSTRUMENTS) ×1 IMPLANT
MARKER SKIN DUAL TIP RULER LAB (MISCELLANEOUS) ×1 IMPLANT
NS IRRIG 1000ML POUR BTL (IV SOLUTION) ×1 IMPLANT
PACK TOTAL KNEE CUSTOM (KITS) ×1 IMPLANT
PIN DRILL HDLS TROCAR 75 4PK (PIN) IMPLANT
PROTECTOR NERVE ULNAR (MISCELLANEOUS) ×1 IMPLANT
SCREW HEADED 33MM KNEE (MISCELLANEOUS) IMPLANT
SET HNDPC FAN SPRY TIP SCT (DISPOSABLE) ×1 IMPLANT
SOLUTION IRRIG SURGIPHOR (IV SOLUTION) IMPLANT
SPIKE FLUID TRANSFER (MISCELLANEOUS) ×1 IMPLANT
STEM POLY PAT PLY 32M KNEE (Knees) IMPLANT
STEM TIBIA 5 DEG SZ D R KNEE (Knees) IMPLANT
STERI STRIP BROWN 1/4X3 R155 (GAUZE/BANDAGES/DRESSINGS) IMPLANT
STRIP CLOSURE SKIN 1/2X4 (GAUZE/BANDAGES/DRESSINGS) ×1 IMPLANT
SUT MNCRL AB 3-0 PS2 18 (SUTURE) ×1 IMPLANT
SUT STRATAFIX 0 PDS 27 VIOLET (SUTURE) ×1
SUT STRATAFIX PDO 1 14 VIOLET (SUTURE) ×1
SUT STRATFX PDO 1 14 VIOLET (SUTURE) ×1
SUT VIC AB 2-0 CT2 27 (SUTURE) ×2 IMPLANT
SUTURE STRATFX 0 PDS 27 VIOLET (SUTURE) ×1 IMPLANT
SUTURE STRATFX PDO 1 14 VIOLET (SUTURE) ×1 IMPLANT
SYR 50ML LL SCALE MARK (SYRINGE) ×1 IMPLANT
TIBIA STEM 5 DEG SZ D R KNEE (Knees) ×1 IMPLANT
TRAY FOLEY MTR SLVR 14FR STAT (SET/KITS/TRAYS/PACK) IMPLANT
TUBE SUCTION HIGH CAP CLEAR NV (SUCTIONS) ×1 IMPLANT
UNDERPAD 30X36 HEAVY ABSORB (UNDERPADS AND DIAPERS) ×1 IMPLANT
WRAP KNEE MAXI GEL POST OP (GAUZE/BANDAGES/DRESSINGS) IMPLANT

## 2021-08-25 NOTE — Anesthesia Procedure Notes (Signed)
Anesthesia Regional Block: Adductor canal block   Pre-Anesthetic Checklist: , timeout performed,  Correct Patient, Correct Site, Correct Laterality,  Correct Procedure, Correct Position, site marked,  Risks and benefits discussed,  Surgical consent,  Pre-op evaluation,  At surgeon's request and post-op pain management  Laterality: Right  Prep: chloraprep       Needles:  Injection technique: Single-shot  Needle Type: Echogenic Needle     Needle Length: 9cm  Needle Gauge: 21     Additional Needles:   Procedures:,,,, ultrasound used (permanent image in chart),,    Narrative:  Start time: 08/25/2021 6:48 AM End time: 08/25/2021 6:54 AM Injection made incrementally with aspirations every 5 mL.  Performed by: Personally  Anesthesiologist: Santa Lighter, MD  Additional Notes: No pain on injection. No increased resistance to injection. Injection made in 5cc increments.  Good needle visualization.  Patient tolerated procedure well.

## 2021-08-25 NOTE — Transfer of Care (Signed)
Immediate Anesthesia Transfer of Care Note  Patient: Ariana [name]  Procedure(s) Performed: TOTAL KNEE ARTHROPLASTY (Right: Knee)  Patient Location: PACU  Anesthesia Type:Spinal  Level of Consciousness: alert   Airway & Oxygen Therapy: Patient Spontanous Breathing  Post-op Assessment: Report given to RN  Post vital signs: stable  Last Vitals:  Vitals Value Taken Time  BP 100/56 08/25/21 0920  Temp 36.4 C 08/25/21 0920  Pulse 75 08/25/21 0923  Resp 17 08/25/21 0923  SpO2 93 % 08/25/21 0923  Vitals shown include unvalidated device data.  Last Pain:  Vitals:   08/25/21 0606  TempSrc:   PainSc: 0-No pain         Complications: No notable events documented.

## 2021-08-25 NOTE — Anesthesia Procedure Notes (Signed)
Spinal  Patient location during procedure: OR Start time: 08/25/2021 7:18 AM End time: 08/25/2021 7:21 AM Reason for block: surgical anesthesia Staffing Performed: anesthesiologist  Anesthesiologist: Santa Lighter, MD Performed by: Santa Lighter, MD Authorized by: Santa Lighter, MD   Preanesthetic Checklist Completed: patient identified, IV checked, risks and benefits discussed, surgical consent, monitors and equipment checked, pre-op evaluation and timeout performed Spinal Block Patient position: sitting Prep: DuraPrep and site prepped and draped Patient monitoring: continuous pulse ox and blood pressure Approach: midline Location: L3-4 Injection technique: single-shot Needle Needle type: Pencan  Needle gauge: 24 G Assessment Events: CSF return Additional Notes Functioning IV was confirmed and monitors were applied. Sterile prep and drape, including hand hygiene, mask and sterile gloves were used. The patient was positioned and the spine was prepped. The skin was anesthetized with lidocaine.  Free flow of clear CSF was obtained prior to injecting local anesthetic into the CSF.  The spinal needle aspirated freely following injection.  The needle was carefully withdrawn.  The patient tolerated the procedure well. Consent was obtained prior to procedure with all questions answered and concerns addressed. Risks including but not limited to bleeding, infection, nerve damage, paralysis, failed block, inadequate analgesia, allergic reaction, high spinal, itching and headache were discussed and the patient wished to proceed.   Hoy Morn, MD

## 2021-08-25 NOTE — Progress Notes (Signed)
Orthopedic Tech Progress Note Patient Details:  Ariana [name] October 11, 1960 [id number]  Ortho Devices Type of Ortho Device: Bone foam zero knee Ortho Device/Splint Interventions: Ordered      Danton Sewer A Jemya Depierro 08/25/2021, 12:03 PM

## 2021-08-25 NOTE — Discharge Instructions (Signed)

## 2021-08-25 NOTE — Evaluation (Signed)
Physical Therapy Evaluation Patient Details Name: Ariana [name] MRN: [id number] DOB: 1960-02-23 Today's Date: 08/25/2021  History of Present Illness  Pt is a 61yo female presenting s/p R-TKA on 08/25/21. PMH: CAD, DM GERD, HLD, HTN  Clinical Impression  Ariana [name] is a 61 y.o. female POD 0 s/p R-TKA. Patient reports independence with mobility at baseline. Patient is now limited by functional impairments (see PT problem list below) and requires min guard for transfers and gait with RW. Patient was able to ambulate 60 feet with RW and min guard and cues for safe walker management. Patient instructed in exercises to facilitate ROM and circulation. Patient will benefit from continued skilled PT interventions to address impairments and progress towards PLOF. Patient has met mobility goals at adequate level for discharge home; will continue to follow if pt continues acute stay to progress towards Mod I goals.        Recommendations for follow up therapy are one component of a multi-disciplinary discharge planning process, led by the attending physician.  Recommendations may be updated based on patient status, additional functional criteria and insurance authorization.  Follow Up Recommendations Follow physician's recommendations for discharge plan and follow up therapies      Assistance Recommended at Discharge Intermittent Supervision/Assistance  Patient can return home with the following  A little help with walking and/or transfers;A little help with bathing/dressing/bathroom;Assistance with cooking/housework;Assist for transportation;Help with stairs or ramp for entrance    Equipment Recommendations Rolling walker (2 wheels)  Recommendations for Other Services       Functional Status Assessment Patient has had a recent decline in their functional status and demonstrates the ability to make significant improvements in function in a reasonable and predictable amount of time.      Precautions / Restrictions Precautions Precautions: None      Mobility  Bed Mobility Overal bed mobility: Modified Independent                  Transfers Overall transfer level: Needs assistance Equipment used: Rolling walker (2 wheels) Transfers: Sit to/from Stand Sit to Stand: Min guard, From elevated surface           General transfer comment: Min guard for safety from stretcher surface, VCs for sequencing, no physical assist required    Ambulation/Gait Ambulation/Gait assistance: Min guard Gait Distance (Feet): 60 Feet Assistive device: Rolling walker (2 wheels) Gait Pattern/deviations: Step-to pattern Gait velocity: decreased     General Gait Details: Pt ambulatd with RW and min guard, no physical assist or overt LOB noted.  Stairs            Wheelchair Mobility    Modified Rankin (Stroke Patients Only)       Balance Overall balance assessment: Needs assistance Sitting-balance support: Feet unsupported, Single extremity supported Sitting balance-Leahy Scale: Good     Standing balance support: Reliant on assistive device for balance, During functional activity, Bilateral upper extremity supported Standing balance-Leahy Scale: Poor                               Pertinent Vitals/Pain Pain Assessment Pain Assessment: 0-10 Pain Score: 5  Pain Location: right knee Pain Descriptors / Indicators: Operative site guarding    Home Living Family/patient expects to be discharged to:: Private residence Living Arrangements: Other relatives Available Help at Discharge: Family;Available 24 hours/day Type of Home: House Home Access: Level entry       Home Layout:  One level Home Equipment: BSC/3in1;Cane - single point;Rollator (4 wheels);Tub bench      Prior Function Prior Level of Function : Independent/Modified Independent;Driving             Mobility Comments: ind ADLs Comments: ind     Hand Dominance         Extremity/Trunk Assessment   Upper Extremity Assessment Upper Extremity Assessment: Overall WFL for tasks assessed    Lower Extremity Assessment Lower Extremity Assessment: RLE deficits/detail;LLE deficits/detail RLE Deficits / Details: MMT ank DF/PF 5/5, No extensor lag noted RLE Sensation: WNL LLE Deficits / Details: MMT ank DF/PF 5/5 LLE Sensation: WNL    Cervical / Trunk Assessment Cervical / Trunk Assessment: Normal  Communication   Communication: No difficulties  Cognition Arousal/Alertness: Awake/alert Behavior During Therapy: WFL for tasks assessed/performed Overall Cognitive Status: Within Functional Limits for tasks assessed                                          General Comments      Exercises Total Joint Exercises Ankle Circles/Pumps: AROM, Right, 10 reps, Left Quad Sets: AROM, Right, Other reps (comment) (3) Short Arc Quad: AROM, Right, Other reps (comment) (3) Heel Slides: AROM, Right, Other reps (comment) (3) Hip ABduction/ADduction: AROM, Right, 5 reps Straight Leg Raises: AROM, Right, 5 reps   Assessment/Plan    PT Assessment All further PT needs can be met in the next venue of care  PT Problem List Decreased strength;Decreased range of motion;Decreased activity tolerance;Decreased balance;Decreased mobility;Decreased coordination;Pain       PT Treatment Interventions      PT Goals (Current goals can be found in the Care Plan section)  Acute Rehab PT Goals Patient Stated Goal: walk without pain PT Goal Formulation: With patient Time For Goal Achievement: 09/01/21 Potential to Achieve Goals: Fair    Frequency       Co-evaluation               AM-PAC PT "6 Clicks" Mobility  Outcome Measure Help needed turning from your back to your side while in a flat bed without using bedrails?: None Help needed moving from lying on your back to sitting on the side of a flat bed without using bedrails?: None Help needed moving to  and from a bed to a chair (including a wheelchair)?: A Little Help needed standing up from a chair using your arms (e.g., wheelchair or bedside chair)?: A Little Help needed to walk in hospital room?: A Little Help needed climbing 3-5 steps with a railing? : A Little 6 Click Score: 20    End of Session Equipment Utilized During Treatment: Gait belt Activity Tolerance: Patient tolerated treatment well;No increased pain Patient left: in chair;with call bell/phone within reach;with nursing/sitter in room Nurse Communication: Mobility status PT Visit Diagnosis: Pain;Difficulty in walking, not elsewhere classified (R26.2) Pain - Right/Left: Right Pain - part of body: Knee    Time: 8676-7209 PT Time Calculation (min) (ACUTE ONLY): 38 min   Charges:   PT Evaluation $PT Eval Low Complexity: 1 Low PT Treatments $Gait Training: 8-22 mins        Coolidge Breeze, PT, DPT [facility] Department Office: [phone] Pager: [phone]  Coolidge Breeze 08/25/2021, 3:12 PM

## 2021-08-25 NOTE — Interval H&P Note (Signed)

## 2021-08-25 NOTE — Op Note (Signed)
DATE OF SURGERY:  08/25/2021 TIME: 9:43 AM  PATIENT NAME:  Ariana [name]   AGE: 61 y.o.    PRE-OPERATIVE DIAGNOSIS:  End-stage right knee osteoarthritis  POST-OPERATIVE DIAGNOSIS:  Same  PROCEDURE:  Right Total Knee Arthroplasty  SURGEON:  Shantel Helwig A Lateesha Bezold, MD   ASSISTANT: Izola Price, RNFA, present and scrubbed throughout the case, critical for assistance with exposure, retraction, instrumentation, and closure.   OPERATIVE IMPLANTS:  Cemented Zimmer persona size 7 femur CR narrow, D tibia, 32 mm patella, 12 mm MC poly Implant Name Type Inv. Item Serial No. Manufacturer Lot No. LRB No. Used Action  CEMENT BONE REFOBACIN R1X40 [place] - [id number] Cement CEMENT BONE REFOBACIN R1X40 [place]  ZIMMER RECON(ORTH,TRAU,BIO,SG) [id number] Right 1 Implanted  CEMENT BONE REFOBACIN R1X40 [place] - [id number] Cement CEMENT BONE REFOBACIN R1X40 [place]  ZIMMER RECON(ORTH,TRAU,BIO,SG) [id number] Right 1 Implanted  STEM POLY PAT PLY 68M KNEE - [id number] Knees STEM POLY PAT PLY 68M KNEE  ZIMMER RECON(ORTH,TRAU,BIO,SG) [id number] Right 1 Implanted  TIBIA STEM 5 DEG SZ D R KNEE - [id number] Knees TIBIA STEM 5 DEG SZ D R KNEE  ZIMMER RECON(ORTH,TRAU,BIO,SG) [id number] Right 1 Implanted  COMP FEM CEMT PERS SZ7 RT - [id number] Joint COMP FEM CEMT PERS SZ7 RT  ZIMMER RECON(ORTH,TRAU,BIO,SG) [id number] Right 1 Implanted  INSERT TIB ASF VIV SZ 6-7 12H - [id number] Insert INSERT TIB ASF VIV SZ 6-7 12H  ZIMMER RECON(ORTH,TRAU,BIO,SG) [id number] Right 1 Implanted      PREOPERATIVE INDICATIONS:  Ariana [name] is a 60 y.o. year old female with end stage bone on bone degenerative arthritis of the knee who failed conservative treatment, including injections, antiinflammatories, activity modification, and assistive devices, and had significant impairment of their activities of daily living, and elected for Total Knee Arthroplasty.   The risks, benefits, and alternatives were discussed at length including but not limited to the risks of  infection, bleeding, nerve injury, stiffness, blood clots, the need for revision surgery, cardiopulmonary complications, among others, and they were willing to proceed.  ESTIMATED BLOOD LOSS: 50cc  OPERATIVE DESCRIPTION:   Once adequate anesthesia was induced, preoperative antibiotics, 2 gm of ancef,1 gm of Tranexamic Acid, and 8 mg of Decadron administered, the patient was positioned supine with a right thigh tourniquet placed.  The right lower extremity was prepped and draped in sterile fashion.  A time-  out was performed identifying the patient, planned procedure, and the appropriate extremity.     The leg was  exsanguinated, tourniquet elevated to 250 mmHg.  A midline incision was  made followed by median parapatellar arthrotomy. Anterior horn of the medial meniscus was released and resected. A medial release was performed, the infrapatellar fat pad was resected with care taken to protect the patellar tendon. The suprapatellar fat was removed to exposed the distal anterior femur. The anterior horn of the lateral meniscus and ACL were released.    Following initial  exposure, I first started with the femur  The femoral  canal was opened with a drill, canal was suctioned to try to prevent fat emboli.  An  intramedullary rod was passed set at 5 degrees valgus, 10 mm. The distal femur was resected.  Following this resection, the tibia was  subluxated anteriorly.  Using the extramedullary guide, 10 mm of bone was resected off the proximal latera; tibia.  We confirmed the gap would be  stable medially and laterally with a size 82m spacer block as well as confirmed that the tibial cut was perpendicular in the coronal  plane, checking with an alignment rod.    Once this was done, the posterior femoral referencing femoral sizer was placed under to the posterior condyles with 3 degrees of external rotational which was parallel to the transepicondylar axis and perpendicular to [employer]. The femur was  sized to be a size 7 in the anterior-  posterior dimension. The  anterior, posterior, and  chamfer cuts were made without difficulty nor   notching making certain that I was along the anterior cortex to help  with flexion gap stability. Next a laminar spreader was placed with the knee in flexion and the medial lateral menisci were resected.  5 cc of the Exparel mixture was injected in the medial side of the back of the knee and 3 cc in the lateral side.  1/2 inch curved osteotome was used to resect posterior osteophyte that was then removed with a pituitary rongeur.       At this point, the tibia was sized to be a size D.  The size D tray was  then pinned in position. Trial reduction was now carried with a 7 femur, D tibia, a 11 mm MC insert.  The knee had full extension and was stable to varus valgus stress in extension.  The knee was slightly tight in flexion and the PCL was partially released.   Attention was next directed to the patella.  Precut  measurement was noted to be 23 mm.  I resected down to 13 mm and used a  30m patellar button to restore patellar height as well as cover the cut surface.     The patella lug holes were drilled and a 32 mm patella poly trial was placed.    The knee was brought to full extension with good flexion stability with the patella tracking through the trochlea without application of pressure.     Next the femoral component was again assessed and determined to be seated and appropriately lateralized.  The femoral lug holes were drilled.  The femoral component was then removed. Tibial component was again assessed and felt to be seated and appropriately rotated with the medial third of the tubercle. The tibia was then drilled, and keel punched.     Final components were  opened and antibiotic cement was mixed.      Final implants were then  cemented onto cleaned and dried cut surfaces of bone with the knee brought to extension with a 11 mm MC poly.  The knee was  irrigated with sterile Betadine diluted in saline as well as pulse lavage normal saline.  The synovial lining was  then injected a dilute Exparel.      Once the cement had fully cured, excess cement was removed throughout the knee.  I confirmed that I was satisfied with the range of motion and stability, and the final 12 mm MC poly insert was chosen.  It was placed into the knee.         The tourniquet had been let down at 60 minutes.  No significant hemostasis was required.  The medial parapatellar arthrotomy was then reapproximated using #1 Stratafix sutures with the knee  in flexion.  The remaining wound was closed with 0 stratafix, 2-0 Vicryl, and running 3-0 Monocryl. The knee was cleaned, dried, dressed sterilely using Dermabond and   Aquacel dressing.  The patient was then brought to recovery room in stable condition, tolerating the procedure  well. There were no complications.   Post op recs: WB: WBAT  Abx: ancef Imaging: PACU xrays DVT prophylaxis: Aspirin '81mg'$  BID x4 weeks Follow up: 2 weeks after surgery for a wound check with Dr. Zachery Dakins at [facility].  Address: [address], [place], [place] [address]  Office Phone: [phone]  Charlies Constable, MD Orthopaedic Surgery

## 2021-08-25 NOTE — Anesthesia Postprocedure Evaluation (Signed)
Anesthesia Post Note  Patient: Ariana [name]  Procedure(s) Performed: TOTAL KNEE ARTHROPLASTY (Right: Knee)     Patient location during evaluation: PACU Anesthesia Type: Spinal Level of consciousness: awake, awake and alert and oriented Pain management: pain level controlled Vital Signs Assessment: post-procedure vital signs reviewed and stable Respiratory status: spontaneous breathing, nonlabored ventilation and respiratory function stable Cardiovascular status: blood pressure returned to baseline and stable Postop Assessment: no headache, no backache, spinal receding and no apparent nausea or vomiting Anesthetic complications: no   No notable events documented.  Last Vitals:  Vitals:   08/25/21 1130 08/25/21 1145  BP: (!) 111/58 (!) 108/53  Pulse: 79 81  Resp: 14 14  Temp:    SpO2: 95% 96%    Last Pain:  Vitals:   08/25/21 1145  TempSrc:   PainSc: 0-No pain                 Santa Lighter

## 2021-08-26 ENCOUNTER — Encounter (HOSPITAL_COMMUNITY): Payer: Self-pay | Admitting: Orthopedic Surgery

## 2021-09-18 ENCOUNTER — Other Ambulatory Visit: Payer: Self-pay | Admitting: Family Medicine

## 2021-09-24 ENCOUNTER — Encounter: Payer: Self-pay | Admitting: Family Medicine

## 2021-09-24 ENCOUNTER — Ambulatory Visit (INDEPENDENT_AMBULATORY_CARE_PROVIDER_SITE_OTHER): Payer: BC Managed Care – PPO | Admitting: Family Medicine

## 2021-09-24 VITALS — BP 134/86 | HR 77 | Temp 97.1°F | Resp 17 | Ht 63.0 in | Wt 211.5 lb

## 2021-09-24 DIAGNOSIS — E119 Type 2 diabetes mellitus without complications: Secondary | ICD-10-CM | POA: Diagnosis not present

## 2021-09-24 NOTE — Patient Instructions (Signed)
Schedule your complete physical in 3-4 months No need for repeat labs today- you look great!!! Keep up the good work in PT- you're crushing it!!! Call with any questions or concerns Stay Safe!  Stay Healthy! Happy [birth year] Birthday!!!

## 2021-09-24 NOTE — Assessment & Plan Note (Signed)
Chronic problem.  Excellent control on Jardiance '10mg'$  daily and Ozempic 0.'5mg'$  weekly.  Last A1C 5.2%  Currently asymptomatic.  No need to repeat labs.  Applauded her efforts.  Will continue to follow.

## 2021-09-24 NOTE — Progress Notes (Signed)
   Subjective:    Patient ID: Ariana [name], female    DOB: June 17, 1960, 61 y.o.   MRN: [id number]  HPI DM- chronic problem, on Jardiance '10mg'$  daily and Ozempic 0.'5mg'$  weekly.  UTD on microalbumin, foot exam, eye exam.  Pt is down 6 lbs since July.  A1C on 8/8 was 5.2%!  Denies symptomatic lows.  No CP, SOB, HAs, visual changes, abd pain, N/V.  No numbness/tingling of hands/feet.  Declines flu.   Review of Systems For ROS see HPI     Objective:   Physical Exam Vitals reviewed.  Constitutional:      General: She is not in acute distress.    Appearance: Normal appearance. She is well-developed. She is not ill-appearing.  HENT:     Head: Normocephalic and atraumatic.  Eyes:     Conjunctiva/sclera: Conjunctivae normal.     Pupils: Pupils are equal, round, and reactive to light.  Neck:     Thyroid: No thyromegaly.  Cardiovascular:     Rate and Rhythm: Normal rate and regular rhythm.     Pulses: Normal pulses.     Heart sounds: Normal heart sounds. No murmur heard. Pulmonary:     Effort: Pulmonary effort is normal. No respiratory distress.     Breath sounds: Normal breath sounds.  Abdominal:     General: There is no distension.     Palpations: Abdomen is soft.     Tenderness: There is no abdominal tenderness.  Musculoskeletal:     Cervical back: Normal range of motion and neck supple.     Right lower leg: No edema.     Left lower leg: No edema.  Lymphadenopathy:     Cervical: No cervical adenopathy.  Skin:    General: Skin is warm and dry.  Neurological:     Mental Status: She is alert and oriented to person, place, and time.  Psychiatric:        Behavior: Behavior normal.           Assessment & Plan:

## 2021-10-17 ENCOUNTER — Other Ambulatory Visit: Payer: Self-pay | Admitting: Family Medicine

## 2021-10-17 MED ORDER — ALPRAZOLAM 1 MG PO TABS: ORAL_TABLET | ORAL | 3 refills | Status: DC

## 2021-10-17 NOTE — Telephone Encounter (Signed)
Patient is requesting a refill of the following medications: Requested Prescriptions   Pending Prescriptions Disp Refills   ALPRAZolam (XANAX) 1 MG tablet 30 tablet 3    Sig: TAKE 1/2 TABLET BY MOUTH TWICE A DAY AS NEEDED FOR ANXIETY    Date of patient request: 10/17/21 Last office visit: 09/24/21 Date of last refill: 04/17/21 Last refill amount: 30 Follow up time period per chart: 3-4 months

## 2021-10-17 NOTE — Telephone Encounter (Signed)
Encourage patient to contact the pharmacy for refills or they can request refills through [facility]  (Please schedule appointment if patient has not been seen in over a year)    WHAT PHARMACY WOULD THEY LIKE THIS SENT TO:  [facility] [address] [phone]  MEDICATION NAME & DOSE: alprazolam 1 mg   NOTES/COMMENTS FROM PATIENT:      Echo office please notify patient: It takes 48-72 hours to process rx refill requests Ask patient to call pharmacy to ensure rx is ready before heading there.

## 2021-10-17 NOTE — Telephone Encounter (Signed)
Called patient and informed

## 2021-11-17 ENCOUNTER — Encounter: Payer: Self-pay | Admitting: Family Medicine

## 2021-11-17 MED ORDER — OZEMPIC (0.25 OR 0.5 MG/DOSE) 2 MG/3ML ~~LOC~~ SOPN: 0.5000 mg | PEN_INJECTOR | SUBCUTANEOUS | 3 refills | Status: DC

## 2021-12-01 NOTE — H&P (Signed)
KNEE ARTHROPLASTY ADMISSION H&P  Patient ID: Ariana [name] MRN: [id number] DOB/AGE: October 17, 1960 61 y.o.  Chief Complaint: left knee pain.  Planned Procedure Date: 12/22/21 Medical Clearance by Dr. Annye Asa Cardiac Clearance by Dr. Shelva Majestic  HPI: Ariana [name] is a 61 y.o. female who presents for evaluation of OA LEFT KNEE. The patient has a history of pain and functional disability in the left knee due to arthritis and has failed non-surgical conservative treatments for greater than 12 weeks to include NSAID's and/or analgesics, corticosteriod injections, viscosupplementation injections, and activity modification.  Onset of symptoms was gradual, starting >10 years ago with gradually worsening course since that time. The patient noted no past surgery on the left knee.  Patient currently rates pain at 5 out of 10 with activity. Patient has worsening of pain with activity and weight bearing and pain that interferes with activities of daily living.  Patient has evidence of joint space narrowing by imaging studies.  There is no active infection.  Past Medical History:  Diagnosis Date   Anxiety    Coronary artery disease    on CT   Depression    Diabetes mellitus without complication (HCC)    Dyspnea    GERD (gastroesophageal reflux disease)    Hyperlipidemia    Hypertension    OA (osteoarthritis)    in Bil knees/ gets steroid injections for pain   Ovarian cyst    right ovary/had ablation   Past Surgical History:  Procedure Laterality Date   COLONOSCOPY     ENDOMETRIAL ABLATION  2012   OVARIAN CYST REMOVAL Right 2012   TOTAL KNEE ARTHROPLASTY Right 08/25/2021   Procedure: TOTAL KNEE ARTHROPLASTY;  Surgeon: Willaim Sheng, MD;  Location: [facility];  Service: Orthopedics;  Laterality: Right;   WISDOM TOOTH EXTRACTION     Allergies  Allergen Reactions   Penicillins Hives and Rash   Prior to Admission medications   Medication Sig Start Date End Date Taking?  Authorizing Provider  acetaminophen (TYLENOL) 500 MG tablet Take 1,000 mg by mouth every 8 (eight) hours as needed for moderate pain.    [provider]  albuterol (VENTOLIN HFA) 108 (90 Base) MCG/ACT inhaler Inhale 2 puffs into the lungs every 6 (six) hours as needed for wheezing or shortness of breath. 01/19/19   Rigoberto Noel, MD  ALPRAZolam Duanne Moron) 1 MG tablet TAKE 1/2 TABLET BY MOUTH TWICE A DAY AS NEEDED FOR ANXIETY 10/17/21   Midge Minium, MD  amLODipine (NORVASC) 5 MG tablet TAKE 1 TABLET BY MOUTH EVERY DAY 06/05/21   Midge Minium, MD  bisoprolol (ZEBETA) 5 MG tablet TAKE 1 TABLET (5 MG TOTAL) BY MOUTH DAILY. 06/05/21   Troy Sine, MD  buPROPion (WELLBUTRIN XL) 300 MG 24 hr tablet TAKE 1 TABLET BY MOUTH EVERY DAY 07/10/21   Midge Minium, MD  celecoxib (CELEBREX) 100 MG capsule Take 100 mg by mouth daily.    [provider]  furosemide (LASIX) 20 MG tablet TAKE 1 TABLET BY MOUTH EVERY DAY AS NEEDED 01/08/20   Midge Minium, MD  JARDIANCE 10 MG TABS tablet TAKE 1 TABLET BY MOUTH EVERY DAY 05/05/21   Midge Minium, MD  nystatin-triamcinolone ointment [facility]) Apply 1 Application topically 2 (two) times daily as needed (yeast infections).    [provider]  omeprazole (PRILOSEC) 40 MG capsule TAKE 1 CAPSULE BY MOUTH EVERY DAY 09/18/21   Midge Minium, MD  rosuvastatin (CRESTOR) 20 MG tablet  TAKE 1 TABLET BY MOUTH EVERY DAY 01/30/21   Troy Sine, MD  Semaglutide,0.25 or 0.'5MG'$ /DOS, (OZEMPIC, 0.25 OR 0.5 MG/DOSE,) 2 MG/3ML SOPN Inject 0.5 mg into the skin every Monday. 11/17/21   Midge Minium, MD  spironolactone (ALDACTONE) 25 MG tablet TAKE 1/2 TABLET BY MOUTH TWICE A DAY 01/01/21   Troy Sine, MD   Social History   Socioeconomic History   Marital status: Divorced    Spouse name: Not on file   Number of children: Not on file   Years of education: Not on file   Highest education level: Not on file  Occupational  History   Not on file  Tobacco Use   Smoking status: Former    Types: Cigarettes    Quit date: 01/18/1981    Years since quitting: 40.8   Smokeless tobacco: Never   Tobacco comments:    pt states she smoker for 3 years in high school (10/28/17)  Vaping Use   Vaping Use: Never used  Substance and Sexual Activity   Alcohol use: No   Drug use: No   Sexual activity: Never  Other Topics Concern   Not on file  Social History Narrative   Not on file   Social Determinants of Health   Financial Resource Strain: Not on file  Food Insecurity: Not on file  Transportation Needs: Not on file  Physical Activity: Not on file  Stress: Not on file  Social Connections: Not on file   Family History  Problem Relation Age of Onset   Dementia Mother    Heart attack Father    Heart disease Father    Arthritis Father    Hyperlipidemia Father    Hypertension Father    Diabetes Brother    Arthritis Brother    Heart disease Brother    Liver disease Brother    Cancer Maternal Aunt     ROS: Currently denies lightheadedness, dizziness, Fever, chills, CP, SOB.   No personal history of DVT, PE, MI, or CVA. Patient has dentures which she will remove prior to surgery. All other systems have been reviewed and were otherwise currently negative with the exception of those mentioned in the HPI and as above.  Objective: Vitals: Ht: '5\' 2"'$  Wt: 200 lbs Temp: 97.8 BP: 115/72 Pulse: 62 O2 95% on room air.   Physical Exam: General: Alert, NAD.  Antalgic Gait  HEENT: EOMI, Good Neck Extension  Pulm: No increased work of breathing.  Clear B/L A/P w/o crackle or wheeze.  CV: RRR, No m/g/r appreciated  GI: soft, NT, ND Neuro: Neuro without gross focal deficit.  Sensation intact distally Skin: No lesions in the area of chief complaint MSK/Surgical Site: left knee w/o redness or effusion.  + medial JLT. ROM 5-115.  5/5 strength in extension and flexion.  +EHL/FHL.  NVI.  Stable varus and valgus stress.     Imaging Review Plain radiographs demonstrate severe degenerative joint disease of the left knee.   The overall alignment issignificant varus. The bone quality appears to be adequate for age and reported activity level.  Preoperative templating of the joint replacement has been completed, documented, and submitted to the Operating Room personnel in order to optimize intra-operative equipment management.  Assessment: OA LEFT KNEE    Plan: Plan for Procedure(s): TOTAL KNEE ARTHROPLASTY  The patient history, physical exam, clinical judgement of the provider and imaging are consistent with end stage degenerative joint disease and total joint arthroplasty is deemed medically necessary. The  treatment options including medical management, injection therapy, and arthroplasty were discussed at length. The risks and benefits of Procedure(s): TOTAL KNEE ARTHROPLASTY were presented and reviewed.  The risks of nonoperative treatment, versus surgical intervention including but not limited to continued pain, aseptic loosening, stiffness, dislocation/subluxation, infection, bleeding, nerve injury, blood clots, cardiopulmonary complications, morbidity, mortality, among others were discussed. The patient verbalizes understanding and wishes to proceed with the plan.  Patient is being admitted for inpatient treatment for surgery, pain control, PT, prophylactic antibiotics, VTE prophylaxis, progressive ambulation, ADL's and discharge planning.   Dental prophylaxis discussed and recommended for 2 years postoperatively.  The patient does meet the criteria for TXA which will be used perioperatively.   ASA 81 mg BID  will be used postoperatively for DVT prophylaxis in addition to SCDs, and early ambulation. The patient is planning to be discharged home with OPPT in care of her brother    Jola Baptist 12/01/2021 3:53 PM

## 2021-12-04 NOTE — Progress Notes (Addendum)
Anesthesia Review:  PCP: Ariana [name]  Cardiologist  Ariana Bame, NP [place] 07/14/21  Has appt with Ariana [name] on 12/23/21 at 1000am  Chest x-ray : EKG : 12/20/20  and 12/09/21  CT [place]- 2019  Echo : 2019  PFT- 2019  Stress test: Cardiac Cath :  Activity level: can do a flight of stairs without difficutly  Sleep Study/ CPAP : none  Fasting Blood Sugar :      / Checks Blood Sugar -- times a day:   Blood Thinner/ Instructions /Last Dose: ASA / Instructions/ Last Dose :   Ozempic  Last dose on 12/15/21  Jardiance Last dose on 12/18/21  Glucose 73 at preop appt on 12/09/21.  PT states she feels fine.  Has not eaten this am but has taken meds.  PT given 6 pack of cheddar cheese cracks and diet cola. PT eating at preop .  PT to eat meal once leaves preop appt.  PST voices understanding.  Ariana [name],Ariana [name] aware.  AT preop appt heart rate 42-43 on dynamapp.  PT voices no complaints.  Denies any chest pain, dizziness, shortness of breath or blurred vision.  PT has taken am meds.  EKG done at preop .  Ariana [name], Ariana [name] seen EKG and aware of above.  PT has appt with Ariana [name] on 12/19/21 at 1000am.  PT aware if she feels any different or has any dizziness, chest pain, blurred vision to notify Ariana [name] office  PT voiced understanding.  PT aware that heart rate is 41-43.  PT discharged from preop appt.

## 2021-12-07 ENCOUNTER — Other Ambulatory Visit: Payer: Self-pay | Admitting: Cardiovascular Disease

## 2021-12-07 ENCOUNTER — Other Ambulatory Visit: Payer: Self-pay | Admitting: Family Medicine

## 2021-12-08 NOTE — Patient Instructions (Signed)
SURGICAL WAITING ROOM VISITATION Patients having surgery or a procedure may have no more than 2 support people in the waiting area - these visitors may rotate.   Children under the age of 70 must have an adult with them who is not the patient. If the patient needs to stay at the hospital during part of their recovery, the visitor guidelines for inpatient rooms apply. Pre-op nurse will coordinate an appropriate time for 1 support person to accompany patient in pre-op.  This support person may not rotate.    Please refer to the [facility] website for the visitor guidelines for Inpatients (after your surgery is over and you are in a regular room).       Your procedure is scheduled on:  12/22/2021    Report to [facility] Main Entrance    Report to admitting at  [place] AM   Call this number if you have problems the morning of surgery [phone]   Do not eat food :After Midnight.   After Midnight you may have the following liquids until __ 0430____ AM DAY OF SURGERY  Water Non-Citrus Juices (without pulp, NO RED) Carbonated Beverages Black Coffee (NO MILK/CREAM OR CREAMERS, sugar ok)  Clear Tea (NO MILK/CREAM OR CREAMERS, sugar ok) regular and decaf                             Plain Jell-O (NO RED)                                           Fruit ices (not with fruit pulp, NO RED)                                     Popsicles (NO RED)                                                               Sports drinks like Gatorade (NO RED)                    The day of surgery:  Drink ONE (1) Pre-Surgery Clear Ensure or G2 at   0430AM ( have completed by )  the morning of surgery. Drink in one sitting. Do not sip.  This drink was given to you during your hospital  pre-op appointment visit. Nothing else to drink after completing the  Pre-Surgery Clear Ensure or G2.          If you have questions, please contact your surgeon's office.       Oral Hygiene is also important to  reduce your risk of infection.                                    Remember - BRUSH YOUR TEETH THE MORNING OF SURGERY WITH YOUR REGULAR TOOTHPASTE  DENTURES WILL BE REMOVED PRIOR TO SURGERY PLEASE DO NOT APPLY "Poly grip" OR ADHESIVES!!!   Do NOT smoke after Midnight   Take these medicines  the morning of surgery with A SIP OF WATER:  inhalers as usual and bring, amlodipine, bisoprolol, wellbutrin, omeprazole     DO NOT TAKE ANY ORAL DIABETIC MEDICATIONS DAY OF YOUR SURGERY  Bring CPAP mask and tubing day of surgery.                              You may not have any metal on your body including hair pins, jewelry, and body piercing             Do not wear make-up, lotions, powders, perfumes/cologne, or deodorant  Do not wear nail polish including gel and S&S, artificial/acrylic nails, or any other type of covering on natural nails including finger and toenails. If you have artificial nails, gel coating, etc. that needs to be removed by a nail salon please have this removed prior to surgery or surgery may need to be canceled/ delayed if the surgeon/ anesthesia feels like they are unable to be safely monitored.   Do not shave  48 hours prior to surgery.               Men may shave face and neck.   Do not bring valuables to the hospital. [place].   Contacts, glasses, dentures or bridgework may not be worn into surgery.   Bring small overnight bag day of surgery.   DO NOT [place]. PHARMACY WILL DISPENSE MEDICATIONS LISTED ON YOUR MEDICATION LIST TO YOU DURING YOUR ADMISSION [place]!    Patients discharged on the day of surgery will not be allowed to drive home.  Someone NEEDS to stay with you for the first 24 hours after anesthesia.   Special Instructions: Bring a copy of your healthcare power of attorney and living will documents the day of surgery if you haven't scanned them before.               Please read over the following fact sheets you were given: IF [place] [phone]   If you received a COVID test during your pre-op visit  it is requested that you wear a mask when out in public, stay away from anyone that may not be feeling well and notify your surgeon if you develop symptoms. If you test positive for Covid or have been in contact with anyone that has tested positive in the last 10 days please notify you surgeon.    [place] - Preparing for Surgery Before surgery, you can play an important role.  Because skin is not sterile, your skin needs to be as free of germs as possible.  You can reduce the number of germs on your skin by washing with CHG (chlorahexidine gluconate) soap before surgery.  CHG is an antiseptic cleaner which kills germs and bonds with the skin to continue killing germs even after washing. Please DO NOT use if you have an allergy to CHG or antibacterial soaps.  If your skin becomes reddened/irritated stop using the CHG and inform your nurse when you arrive at Short Stay. Do not shave (including legs and underarms) for at least 48 hours prior to the first CHG shower.  You may shave your face/neck. Please follow these instructions carefully:  1.  Shower with CHG Soap the night before  surgery and the  morning of Surgery.  2.  If you choose to wash your hair, wash your hair first as usual with your  normal  shampoo.  3.  After you shampoo, rinse your hair and body thoroughly to remove the  shampoo.                           4.  Use CHG as you would any other liquid soap.  You can apply chg directly  to the skin and wash                       Gently with a scrungie or clean washcloth.  5.  Apply the CHG Soap to your body ONLY FROM THE NECK DOWN.   Do not use on face/ open                           Wound or open sores. Avoid contact with eyes, ears mouth and genitals (private parts).                        Wash face,  Genitals (private parts) with your normal soap.             6.  Wash thoroughly, paying special attention to the area where your surgery  will be performed.  7.  Thoroughly rinse your body with warm water from the neck down.  8.  DO NOT shower/wash with your normal soap after using and rinsing off  the CHG Soap.                9.  Pat yourself dry with a clean towel.            10.  Wear clean pajamas.            11.  Place clean sheets on your bed the night of your first shower and do not  sleep with pets. Day of Surgery : Do not apply any lotions/deodorants the morning of surgery.  Please wear clean clothes to the hospital/surgery center.  FAILURE TO FOLLOW THESE INSTRUCTIONS MAY RESULT IN THE CANCELLATION OF YOUR SURGERY PATIENT SIGNATURE_________________________________  NURSE SIGNATURE__________________________________  ________________________________________________________________________

## 2021-12-09 ENCOUNTER — Other Ambulatory Visit: Payer: Self-pay

## 2021-12-09 ENCOUNTER — Encounter (HOSPITAL_COMMUNITY): Payer: Self-pay

## 2021-12-09 ENCOUNTER — Encounter (HOSPITAL_COMMUNITY)
Admission: RE | Admit: 2021-12-09 | Discharge: 2021-12-09 | Disposition: A | Discharge status: Home / Self Care | Payer: BC Managed Care – PPO | Source: Ambulatory Visit | Attending: Orthopedic Surgery | Admitting: Orthopedic Surgery

## 2021-12-09 VITALS — BP 108/67 | HR 43 | Temp 98.0°F | Resp 16 | Ht 63.0 in | Wt 203.0 lb

## 2021-12-09 DIAGNOSIS — Z01818 Encounter for other preprocedural examination: Secondary | ICD-10-CM | POA: Insufficient documentation

## 2021-12-09 LAB — BASIC METABOLIC PANEL
Anion gap: 6 (ref 5–15)
BUN: 13 mg/dL (ref 8–23)
CO2: 24 mmol/L (ref 22–32)
Calcium: 9.5 mg/dL (ref 8.9–10.3)
Chloride: 110 mmol/L (ref 98–111)
Creatinine, Ser: 0.93 mg/dL (ref 0.44–1.00)
GFR, Estimated: >60 mL/min (ref >60)
Glucose, Bld: 76 mg/dL (ref 70–99)
Potassium: 4 mmol/L (ref 3.5–5.1)
Sodium: 140 mmol/L (ref 135–145)

## 2021-12-09 LAB — SURGICAL PCR SCREEN
MRSA, PCR: NEGATIVE
Staphylococcus aureus: NEGATIVE

## 2021-12-09 LAB — CBC
HCT: 42.8 % (ref 36.0–46.0)
Hemoglobin: 12.9 g/dL (ref 12.0–15.0)
MCH: 25.1 pg — ABNORMAL LOW (ref 26.0–34.0)
MCHC: 30.1 g/dL (ref 30.0–36.0)
MCV: 83.4 fL (ref 80.0–100.0)
Platelets: 345 10*3/uL (ref 150–400)
RBC: 5.13 MIL/uL — ABNORMAL HIGH (ref 3.87–5.11)
RDW: 16.5 % — ABNORMAL HIGH (ref 11.5–15.5)
WBC: 9.5 10*3/uL (ref 4.0–10.5)
nRBC: 0 % (ref 0.0–0.2)

## 2021-12-09 LAB — GLUCOSE, CAPILLARY: Glucose-Capillary: 73 mg/dL (ref 70–99)

## 2021-12-10 LAB — HEMOGLOBIN A1C
Hgb A1c MFr Bld: 5.4 % (ref 4.8–5.6)
Mean Plasma Glucose: 108 mg/dL

## 2021-12-11 ENCOUNTER — Other Ambulatory Visit: Payer: Self-pay | Admitting: Family Medicine

## 2021-12-19 ENCOUNTER — Ambulatory Visit: Payer: BC Managed Care – PPO | Admitting: Cardiovascular Disease

## 2021-12-19 ENCOUNTER — Encounter: Payer: Self-pay | Admitting: Cardiovascular Disease

## 2021-12-19 ENCOUNTER — Ambulatory Visit: Payer: BC Managed Care – PPO | Attending: Cardiovascular Disease | Admitting: Cardiovascular Disease

## 2021-12-19 VITALS — BP 124/80 | HR 63 | Ht 63.0 in | Wt 205.0 lb

## 2021-12-19 DIAGNOSIS — Z01818 Encounter for other preprocedural examination: Secondary | ICD-10-CM | POA: Diagnosis not present

## 2021-12-19 NOTE — Progress Notes (Signed)
Pt aware to arrive at [facility] admitting dept at 0730 on Monday 12/22/2021 for scheduled surgical procedure. No food after midnight; clear liquids from midnight till 0715 consuming entire pre surgery drink by 0715 then nothing by mouth.

## 2021-12-19 NOTE — Progress Notes (Signed)
Dr. Kennon Holter note for pre-operative clearance sent to [place], Dr. Charlies Constable using Epic fax function.

## 2021-12-19 NOTE — Progress Notes (Signed)
12/19/2021 ORAH [name]   04-06-1960  [id number]  Primary Physician Midge Minium, MD Primary Cardiologist: Lorretta Harp MD Lupe Carney, [place]  HPI:  Ariana [name] is a 61 y.o. 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  She is a cardiology patient of Dr. Evette Georges.  She does have a history of type 2 diabetes, hyperlipidemia, essential hypertension, heart failure with preserved EF.  She had an uncomplicated right total knee replacement 08/25/2021 which she was cleared for at low risk.  She has an upcoming elective left total knee replacement scheduled for 12/22/2021.  She had a fairly normal 2D echo performed 08/04/2017 with grade 2 diastolic dysfunction and coronary CTA at that time which revealed a coronary calcium score of 3 with no obstructive CAD.  She is active and denies any symptoms.   Current Meds  Medication Sig   acetaminophen (TYLENOL) 500 MG tablet Take 1,000 mg by mouth every 8 (eight) hours as needed for moderate pain.   albuterol (VENTOLIN HFA) 108 (90 Base) MCG/ACT inhaler Inhale 2 puffs into the lungs every 6 (six) hours as needed for wheezing or shortness of breath.   ALPRAZolam (XANAX) 1 MG tablet TAKE 1/2 TABLET BY MOUTH TWICE A DAY AS NEEDED FOR ANXIETY   amLODipine (NORVASC) 5 MG tablet TAKE 1 TABLET BY MOUTH EVERY DAY   bisoprolol (ZEBETA) 5 MG tablet TAKE 1 TABLET (5 MG TOTAL) BY MOUTH DAILY.   buPROPion (WELLBUTRIN XL) 300 MG 24 hr tablet TAKE 1 TABLET BY MOUTH EVERY DAY   clindamycin (CLEOCIN) 300 MG capsule Take 600 mg by mouth See admin instructions. Take 2 capsules (600 mg) by mouth 1 hour prior to dental appointment   furosemide (LASIX) 20 MG tablet TAKE 1 TABLET BY MOUTH EVERY DAY AS NEEDED   JARDIANCE 10 MG TABS tablet TAKE 1 TABLET BY MOUTH EVERY DAY   nystatin-triamcinolone ointment (MYCOLOG) Apply 1 Application  topically 2 (two) times daily as needed (yeast infections).   omeprazole (PRILOSEC) 40 MG capsule TAKE 1 CAPSULE BY MOUTH EVERY DAY   rosuvastatin (CRESTOR) 20 MG tablet TAKE 1 TABLET BY MOUTH EVERY DAY   Semaglutide,0.25 or 0.'5MG'$ /DOS, (OZEMPIC, 0.25 OR 0.5 MG/DOSE,) 2 MG/3ML SOPN Inject 0.5 mg into the skin every Monday.   spironolactone (ALDACTONE) 25 MG tablet TAKE 1/2 TABLET BY MOUTH TWICE A DAY     Allergies  Allergen Reactions   Penicillins Hives and Rash    Social History   Socioeconomic History   Marital status: Divorced    Spouse name: Not on file   Number of children: Not on file   Years of education: Not on file   Highest education level: Not on file  Occupational History   Not on file  Tobacco Use   Smoking status: Former    Types: Cigarettes    Quit date: 01/18/1981    Years since quitting: 40.9   Smokeless tobacco: Never   Tobacco comments:    pt states she smoker for 3 years in high school (10/28/17)  Vaping Use   Vaping Use: Never used  Substance and Sexual Activity   Alcohol use: No   Drug use: No   Sexual activity: Never  Other Topics Concern   Not on file  Social History Narrative   Not on file   Social Determinants of Health   Financial Resource Strain:  Not on file  Food Insecurity: Not on file  Transportation Needs: Not on file  Physical Activity: Not on file  Stress: Not on file  Social Connections: Not on file  Intimate Partner Violence: Not on file     Review of Systems: General: negative for chills, fever, night sweats or weight changes.  Cardiovascular: negative for chest pain, dyspnea on exertion, edema, orthopnea, palpitations, paroxysmal nocturnal dyspnea or shortness of breath Dermatological: negative for rash Respiratory: negative for cough or wheezing Urologic: negative for hematuria Abdominal: negative for nausea, vomiting, diarrhea, bright red blood per rectum, melena, or hematemesis Neurologic: negative for visual changes,  syncope, or dizziness All other systems reviewed and are otherwise negative except as noted above.    Blood pressure 124/80, pulse 63, height '5\' 3"'$  (1.6 m), weight 205 lb (93 kg), SpO2 100 %.  General appearance: alert and no distress Neck: no adenopathy, no carotid bruit, no JVD, supple, symmetrical, trachea midline, and thyroid not enlarged, symmetric, no tenderness/mass/nodules Lungs: clear to auscultation bilaterally Heart: regular rate and rhythm, S1, S2 normal, no murmur, click, rub or gallop Extremities: extremities normal, atraumatic, no cyanosis or edema Pulses: 2+ and symmetric Skin: Skin color, texture, turgor normal. No rashes or lesions Neurologic: Grossly normal  EKG sinus rhythm at 72 with an occasional PVC and nonspecific ST and T wave changes.  Personally reviewed this EKG.  ASSESSMENT AND PLAN:   Preoperative clearance Ariana [name] is here for preoperative clearance before left total knee replacement scheduled for 12/22/2021.  She did have uncomplicated right total knee replacement performed 08/25/2021 which she was cleared for.  She has a history of hypertension, type 2 diabetes, hyperlipidemia, obesity and heart failure with preserved EF.  She did have a fairly normal 2D echo 08/04/2017 except for grade 2 diastolic dysfunction and a coronary CTA at that time that revealed a coronary calcium score of 3.89 with no evidence of obstructive CAD.  She is very active.  She denies chest pain or shortness of breath.  She apparently had an EKG that showed PVCs with an effective heart rate of 40.  Today she has 1 PVC on her EKG.  This is not a concern to me.  She is cleared for her upcoming procedure at low cardiovascular risk.     Lorretta Harp MD FACP,FACC,FAHA, [facility] 12/19/2021 1:11 PM

## 2021-12-19 NOTE — Assessment & Plan Note (Signed)
Ariana [name] is here for preoperative clearance before left total knee replacement scheduled for 12/22/2021.  She did have uncomplicated right total knee replacement performed 08/25/2021 which she was cleared for.  She has a history of hypertension, type 2 diabetes, hyperlipidemia, obesity and heart failure with preserved EF.  She did have a fairly normal 2D echo 08/04/2017 except for grade 2 diastolic dysfunction and a coronary CTA at that time that revealed a coronary calcium score of 3.89 with no evidence of obstructive CAD.  She is very active.  She denies chest pain or shortness of breath.  She apparently had an EKG that showed PVCs with an effective heart rate of 40.  Today she has 1 PVC on her EKG.  This is not a concern to me.  She is cleared for her upcoming procedure at low cardiovascular risk.

## 2021-12-19 NOTE — Patient Instructions (Addendum)
Medication Instructions:  Your physician recommends that you continue on your current medications as directed. Please refer to the Current Medication list given to you today.  *If you need a refill on your cardiac medications before your next appointment, please call your pharmacy*   Follow-Up: At [facility], you and your health needs are our priority.  As part of our continuing mission to provide you with exceptional heart care, we have created designated Provider Care Teams.  These Care Teams include your primary Cardiologist (physician) and Advanced Practice Providers (APPs -  Physician Assistants and Nurse Practitioners) who all work together to provide you with the care you need, when you need it.  We recommend signing up for the patient portal called "MyChart".  Sign up information is provided on this After Visit Summary.  MyChart is used to connect with patients for Virtual Visits (Telemedicine).  Patients are able to view lab/test results, encounter notes, upcoming appointments, etc.  Non-urgent messages can be sent to your provider as well.   To learn more about what you can do with MyChart, go to [email or web address].    Your next appointment:   As scheduled with Dr. Claiborne Billings- Jan. 24, 2024 @ 1:40pm.

## 2021-12-21 NOTE — Anesthesia Preprocedure Evaluation (Signed)
Anesthesia Evaluation  Patient identified by MRN, date of birth, ID band Patient awake    Reviewed: Allergy & Precautions, NPO status , Patient's Chart, lab work & pertinent test results  Airway Mallampati: I  TM Distance: >3 FB Neck ROM: Full    Dental  (+) Chipped, Dental Advisory Given,    Pulmonary asthma , former smoker   Pulmonary exam normal breath sounds clear to auscultation       Cardiovascular hypertension, Pt. on home beta blockers and Pt. on medications + CAD and +CHF  Normal cardiovascular exam Rhythm:Regular Rate:Normal  TTE 2019 - Left ventricle: The cavity size was normal. Wall thickness was    normal. Systolic function was normal. The estimated ejection    fraction was in the range of 60% to 65%. Wall motion was normal;    there were no regional wall motion abnormalities. Features are    consistent with a pseudonormal left ventricular filling pattern,    with concomitant abnormal relaxation and increased filling    pressure (grade 2 diastolic dysfunction).  - No significant valvular abnormalitites   Neuro/Psych  PSYCHIATRIC DISORDERS Anxiety Depression    negative neurological ROS     GI/Hepatic Neg liver ROS,GERD  ,,  Endo/Other  diabetes, Type 2, Oral Hypoglycemic Agents    Renal/GU negative Renal ROS  negative genitourinary   Musculoskeletal  (+) Arthritis ,    Abdominal   Peds  Hematology negative hematology ROS (+)   Anesthesia Other Findings   Reproductive/Obstetrics                             Anesthesia Physical Anesthesia Plan  ASA: 3  Anesthesia Plan: Spinal and Regional   Post-op Pain Management: Regional block* and Tylenol PO (pre-op)*   Induction:   PONV Risk Score and Plan: 2 and Treatment may vary due to age or medical condition, Midazolam, Propofol infusion, Dexamethasone and Ondansetron  Airway Management Planned: Natural Airway  Additional  Equipment:   Intra-op Plan:   Post-operative Plan:   Informed Consent: I have reviewed the patients History and Physical, chart, labs and discussed the procedure including the risks, benefits and alternatives for the proposed anesthesia with the patient or authorized representative who has indicated his/her understanding and acceptance.     Dental advisory given  Plan Discussed with: CRNA  Anesthesia Plan Comments:        Anesthesia Quick Evaluation

## 2021-12-22 ENCOUNTER — Ambulatory Visit (HOSPITAL_COMMUNITY): Payer: BC Managed Care – PPO | Admitting: Physician Assistant

## 2021-12-22 ENCOUNTER — Other Ambulatory Visit: Payer: Self-pay

## 2021-12-22 ENCOUNTER — Ambulatory Visit (HOSPITAL_COMMUNITY)
Admission: RE | Admit: 2021-12-22 | Discharge: 2021-12-22 | Disposition: A | Discharge status: Home / Self Care | Payer: BC Managed Care – PPO | Source: Ambulatory Visit | Attending: Orthopedic Surgery | Admitting: Orthopedic Surgery

## 2021-12-22 ENCOUNTER — Ambulatory Visit (HOSPITAL_COMMUNITY): Payer: BC Managed Care – PPO

## 2021-12-22 ENCOUNTER — Encounter (HOSPITAL_COMMUNITY): Payer: Self-pay | Admitting: Orthopedic Surgery

## 2021-12-22 ENCOUNTER — Encounter (HOSPITAL_COMMUNITY)
Admission: RE | Disposition: A | Discharge status: Home / Self Care | Payer: Self-pay | Source: Ambulatory Visit | Attending: Orthopedic Surgery

## 2021-12-22 DIAGNOSIS — I2510 Atherosclerotic heart disease of native coronary artery without angina pectoris: Secondary | ICD-10-CM | POA: Insufficient documentation

## 2021-12-22 DIAGNOSIS — F419 Anxiety disorder, unspecified: Secondary | ICD-10-CM | POA: Diagnosis not present

## 2021-12-22 DIAGNOSIS — J45909 Unspecified asthma, uncomplicated: Secondary | ICD-10-CM | POA: Insufficient documentation

## 2021-12-22 DIAGNOSIS — Z7984 Long term (current) use of oral hypoglycemic drugs: Secondary | ICD-10-CM | POA: Insufficient documentation

## 2021-12-22 DIAGNOSIS — Z01818 Encounter for other preprocedural examination: Secondary | ICD-10-CM

## 2021-12-22 DIAGNOSIS — F32A Depression, unspecified: Secondary | ICD-10-CM | POA: Diagnosis not present

## 2021-12-22 DIAGNOSIS — I110 Hypertensive heart disease with heart failure: Secondary | ICD-10-CM | POA: Insufficient documentation

## 2021-12-22 DIAGNOSIS — E119 Type 2 diabetes mellitus without complications: Secondary | ICD-10-CM | POA: Insufficient documentation

## 2021-12-22 DIAGNOSIS — Z87891 Personal history of nicotine dependence: Secondary | ICD-10-CM | POA: Insufficient documentation

## 2021-12-22 DIAGNOSIS — I509 Heart failure, unspecified: Secondary | ICD-10-CM | POA: Diagnosis not present

## 2021-12-22 DIAGNOSIS — M1712 Unilateral primary osteoarthritis, left knee: Secondary | ICD-10-CM | POA: Insufficient documentation

## 2021-12-22 DIAGNOSIS — K219 Gastro-esophageal reflux disease without esophagitis: Secondary | ICD-10-CM | POA: Insufficient documentation

## 2021-12-22 DIAGNOSIS — M24562 Contracture, left knee: Secondary | ICD-10-CM | POA: Insufficient documentation

## 2021-12-22 HISTORY — PX: TOTAL KNEE ARTHROPLASTY: SHX125

## 2021-12-22 LAB — GLUCOSE, CAPILLARY
Glucose-Capillary: 88 mg/dL (ref 70–99)
Glucose-Capillary: 86 mg/dL (ref 70–99)

## 2021-12-22 SURGERY — ARTHROPLASTY, KNEE, TOTAL
Anesthesia: Regional | Site: Knee | Laterality: Left

## 2021-12-22 MED ORDER — METHOCARBAMOL 500 MG PO TABS: 500.0000 mg | ORAL_TABLET | Freq: Three times a day (TID) | ORAL | 0 refills | Status: AC | PRN

## 2021-12-22 MED ORDER — SODIUM CHLORIDE (PF) 0.9 % IJ SOLN
INTRAMUSCULAR | Status: AC
  Filled 2021-12-22: qty 10

## 2021-12-22 MED ORDER — FENTANYL CITRATE (PF) 100 MCG/2ML IJ SOLN
INTRAMUSCULAR | Status: AC
  Filled 2021-12-22: qty 2

## 2021-12-22 MED ORDER — POVIDONE-IODINE 10 % EX SWAB: 2.0000 | Freq: Once | CUTANEOUS | Status: DC

## 2021-12-22 MED ORDER — FENTANYL CITRATE PF 50 MCG/ML IJ SOSY
100.0000 ug | PREFILLED_SYRINGE | INTRAMUSCULAR | Status: DC
  Administered 2021-12-22: 50 ug via INTRAVENOUS
  Filled 2021-12-22: qty 2

## 2021-12-22 MED ORDER — BUPIVACAINE LIPOSOME 1.3 % IJ SUSP
INTRAMUSCULAR | Status: AC
  Filled 2021-12-22: qty 20

## 2021-12-22 MED ORDER — BUPIVACAINE LIPOSOME 1.3 % IJ SUSP
INTRAMUSCULAR | Status: DC | PRN
  Administered 2021-12-22: 20 mL

## 2021-12-22 MED ORDER — ACETAMINOPHEN 500 MG PO TABS: 1000.0000 mg | ORAL_TABLET | Freq: Once | ORAL | Status: DC

## 2021-12-22 MED ORDER — FENTANYL CITRATE PF 50 MCG/ML IJ SOSY: 25.0000 ug | PREFILLED_SYRINGE | INTRAMUSCULAR | Status: DC | PRN

## 2021-12-22 MED ORDER — METHOCARBAMOL 500 MG IVPB - SIMPLE MED: 500.0000 mg | Freq: Four times a day (QID) | INTRAVENOUS | Status: DC | PRN

## 2021-12-22 MED ORDER — OXYCODONE HCL 5 MG PO TABS: 5.0000 mg | ORAL_TABLET | ORAL | 0 refills | Status: AC | PRN

## 2021-12-22 MED ORDER — SODIUM CHLORIDE 0.9% FLUSH
INTRAVENOUS | Status: DC | PRN
  Administered 2021-12-22: 60 mL

## 2021-12-22 MED ORDER — 0.9 % SODIUM CHLORIDE (POUR BTL) OPTIME
TOPICAL | Status: DC | PRN
  Administered 2021-12-22: 1000 mL

## 2021-12-22 MED ORDER — ORAL CARE MOUTH RINSE: 15.0000 mL | Freq: Once | OROMUCOSAL | Status: AC

## 2021-12-22 MED ORDER — CHLORHEXIDINE GLUCONATE 0.12 % MT SOLN
15.0000 mL | Freq: Once | OROMUCOSAL | Status: AC
  Administered 2021-12-22: 15 mL via OROMUCOSAL

## 2021-12-22 MED ORDER — EPHEDRINE SULFATE (PRESSORS) 50 MG/ML IJ SOLN
INTRAMUSCULAR | Status: DC | PRN
  Administered 2021-12-22 (×5): 5 mg via INTRAVENOUS

## 2021-12-22 MED ORDER — ONDANSETRON HCL 4 MG PO TABS: 4.0000 mg | ORAL_TABLET | Freq: Three times a day (TID) | ORAL | 0 refills | Status: AC | PRN

## 2021-12-22 MED ORDER — ONDANSETRON HCL 4 MG/2ML IJ SOLN
INTRAMUSCULAR | Status: DC | PRN
  Administered 2021-12-22: 4 mg via INTRAVENOUS

## 2021-12-22 MED ORDER — PHENYLEPHRINE 80 MCG/ML (10ML) SYRINGE FOR IV PUSH (FOR BLOOD PRESSURE SUPPORT)
PREFILLED_SYRINGE | INTRAVENOUS | Status: DC | PRN
  Administered 2021-12-22: 160 ug via INTRAVENOUS

## 2021-12-22 MED ORDER — LACTATED RINGERS IV BOLUS: 250.0000 mL | Freq: Once | INTRAVENOUS | Status: DC

## 2021-12-22 MED ORDER — BUPIVACAINE IN DEXTROSE 0.75-8.25 % IT SOLN
INTRATHECAL | Status: DC | PRN
  Administered 2021-12-22: 1.6 mL via INTRATHECAL

## 2021-12-22 MED ORDER — SODIUM CHLORIDE 0.9 % IR SOLN
Status: DC | PRN
  Administered 2021-12-22: 1000 mL

## 2021-12-22 MED ORDER — EPHEDRINE 5 MG/ML INJ
INTRAVENOUS | Status: AC
  Filled 2021-12-22: qty 10

## 2021-12-22 MED ORDER — DEXAMETHASONE SODIUM PHOSPHATE 10 MG/ML IJ SOLN
INTRAMUSCULAR | Status: DC | PRN
  Administered 2021-12-22: 10 mg via INTRAVENOUS

## 2021-12-22 MED ORDER — ACETAMINOPHEN 500 MG PO TABS
1000.0000 mg | ORAL_TABLET | Freq: Once | ORAL | Status: AC
  Administered 2021-12-22: 1000 mg via ORAL
  Filled 2021-12-22: qty 2

## 2021-12-22 MED ORDER — POVIDONE-IODINE 7.5 % EX SOLN: Freq: Once | CUTANEOUS | Status: DC

## 2021-12-22 MED ORDER — SODIUM CHLORIDE (PF) 0.9 % IJ SOLN
INTRAMUSCULAR | Status: AC
  Filled 2021-12-22: qty 50

## 2021-12-22 MED ORDER — ROPIVACAINE HCL 5 MG/ML IJ SOLN
INTRAMUSCULAR | Status: DC | PRN
  Administered 2021-12-22: 20 mL via PERINEURAL

## 2021-12-22 MED ORDER — LACTATED RINGERS IV BOLUS
250.0000 mL | Freq: Once | INTRAVENOUS | Status: AC
  Administered 2021-12-22: 250 mL via INTRAVENOUS

## 2021-12-22 MED ORDER — PROPOFOL 10 MG/ML IV BOLUS
INTRAVENOUS | Status: DC | PRN
  Administered 2021-12-22: 50 ug/kg/min via INTRAVENOUS
  Administered 2021-12-22: 50 mg via INTRAVENOUS
  Administered 2021-12-22: 30 mg via INTRAVENOUS

## 2021-12-22 MED ORDER — WATER FOR IRRIGATION, STERILE IR SOLN
Status: DC | PRN
  Administered 2021-12-22: 2000 mL

## 2021-12-22 MED ORDER — MIDAZOLAM HCL 2 MG/2ML IJ SOLN
2.0000 mg | INTRAMUSCULAR | Status: DC
  Administered 2021-12-22: 2 mg via INTRAVENOUS
  Filled 2021-12-22: qty 2

## 2021-12-22 MED ORDER — DEXAMETHASONE SODIUM PHOSPHATE 10 MG/ML IJ SOLN
INTRAMUSCULAR | Status: DC | PRN
  Administered 2021-12-22: 5 mg

## 2021-12-22 MED ORDER — POVIDONE-IODINE 10 % EX SWAB
2.0000 | Freq: Once | CUTANEOUS | Status: AC
  Administered 2021-12-22: 2 via TOPICAL

## 2021-12-22 MED ORDER — CELECOXIB 200 MG PO CAPS
400.0000 mg | ORAL_CAPSULE | Freq: Once | ORAL | Status: AC
  Administered 2021-12-22: 400 mg via ORAL
  Filled 2021-12-22: qty 2

## 2021-12-22 MED ORDER — LACTATED RINGERS IV SOLN: INTRAVENOUS | Status: DC

## 2021-12-22 MED ORDER — LACTATED RINGERS IV BOLUS
500.0000 mL | Freq: Once | INTRAVENOUS | Status: AC
  Administered 2021-12-22: 500 mL via INTRAVENOUS

## 2021-12-22 MED ORDER — BUPIVACAINE LIPOSOME 1.3 % IJ SUSP: 20.0000 mL | Freq: Once | INTRAMUSCULAR | Status: DC

## 2021-12-22 MED ORDER — TRANEXAMIC ACID-NACL 1000-0.7 MG/100ML-% IV SOLN
1000.0000 mg | INTRAVENOUS | Status: AC
  Administered 2021-12-22: 1000 mg via INTRAVENOUS
  Filled 2021-12-22: qty 100

## 2021-12-22 MED ORDER — METHOCARBAMOL 500 MG PO TABS: 500.0000 mg | ORAL_TABLET | Freq: Four times a day (QID) | ORAL | Status: DC | PRN

## 2021-12-22 MED ORDER — CEFAZOLIN SODIUM-DEXTROSE 2-4 GM/100ML-% IV SOLN
2.0000 g | INTRAVENOUS | Status: AC
  Administered 2021-12-22: 2 g via INTRAVENOUS
  Filled 2021-12-22: qty 100

## 2021-12-22 SURGICAL SUPPLY — 63 items
BAG COUNTER SPONGE SURGICOUNT (BAG) IMPLANT
BLADE SAG 18X100X1.27 (BLADE) ×1 IMPLANT
BLADE SAW SAG 35X64 .89 (BLADE) ×1 IMPLANT
BNDG COHESIVE 3X5 TAN ST LF (GAUZE/BANDAGES/DRESSINGS) ×1 IMPLANT
BNDG ELASTIC 6X10 VLCR STRL LF (GAUZE/BANDAGES/DRESSINGS) ×1 IMPLANT
BOWL SMART MIX CTS (DISPOSABLE) ×1 IMPLANT
CEMENT BONE R 1X40 (Cement) IMPLANT
CEMENT BONE REFOBACIN R1X40 US (Cement) IMPLANT
CHLORAPREP W/TINT 26 (MISCELLANEOUS) ×2 IMPLANT
COMP MED POLY AS PERS S6-7 12 (Joint) ×1 IMPLANT
COMPONENT MED PLY PERSS6-7 12 (Joint) IMPLANT
COVER SURGICAL LIGHT HANDLE (MISCELLANEOUS) ×1 IMPLANT
CUFF TOURN SGL QUICK 34 (TOURNIQUET CUFF) ×1
CUFF TRNQT CYL 34X4.125X (TOURNIQUET CUFF) ×1 IMPLANT
DERMABOND ADVANCED .7 DNX12 (GAUZE/BANDAGES/DRESSINGS) ×1 IMPLANT
DRAPE INCISE IOBAN 85X60 (DRAPES) ×1 IMPLANT
DRAPE SHEET LG 3/4 BI-LAMINATE (DRAPES) ×1 IMPLANT
DRAPE U-SHAPE 47X51 STRL (DRAPES) ×1 IMPLANT
DRESSING AQUACEL AG SP 3.5X10 (GAUZE/BANDAGES/DRESSINGS) ×1 IMPLANT
DRSG AQUACEL AG ADV 3.5X10 (GAUZE/BANDAGES/DRESSINGS) IMPLANT
DRSG AQUACEL AG SP 3.5X10 (GAUZE/BANDAGES/DRESSINGS) ×1
ELECT REM PT RETURN 15FT ADLT (MISCELLANEOUS) ×1 IMPLANT
GAUZE SPONGE 4X4 12PLY STRL (GAUZE/BANDAGES/DRESSINGS) ×1 IMPLANT
GLOVE BIO SURGEON STRL SZ 6.5 (GLOVE) ×2 IMPLANT
GLOVE BIOGEL PI IND STRL 6.5 (GLOVE) ×1 IMPLANT
GLOVE BIOGEL PI IND STRL 7.0 (GLOVE) IMPLANT
GLOVE BIOGEL PI IND STRL 8 (GLOVE) ×1 IMPLANT
GLOVE SURG ORTHO 8.0 STRL STRW (GLOVE) ×2 IMPLANT
GLOVE SURG SS PI 6.5 STRL IVOR (GLOVE) IMPLANT
GOWN STRL REUS W/ TWL XL LVL3 (GOWN DISPOSABLE) ×2 IMPLANT
GOWN STRL REUS W/TWL XL LVL3 (GOWN DISPOSABLE) ×1
HANDPIECE INTERPULSE COAX TIP (DISPOSABLE) ×1
HDLS TROCR DRIL PIN KNEE 75 (PIN) ×1
HOLDER FOLEY CATH W/STRAP (MISCELLANEOUS) ×1 IMPLANT
HOOD PEEL AWAY T7 (MISCELLANEOUS) ×3 IMPLANT
KNEE SYSTEM FEMUR SZ 6 LT (Knees) IMPLANT
MANIFOLD NEPTUNE II (INSTRUMENTS) ×1 IMPLANT
MARKER SKIN DUAL TIP RULER LAB (MISCELLANEOUS) ×1 IMPLANT
NS IRRIG 1000ML POUR BTL (IV SOLUTION) ×1 IMPLANT
PACK TOTAL KNEE CUSTOM (KITS) ×1 IMPLANT
PIN DRILL HDLS TROCAR 75 4PK (PIN) IMPLANT
PROTECTOR NERVE ULNAR (MISCELLANEOUS) ×1 IMPLANT
SCREW HEADED 33MM KNEE (MISCELLANEOUS) IMPLANT
SET HNDPC FAN SPRY TIP SCT (DISPOSABLE) ×1 IMPLANT
SOLUTION IRRIG SURGIPHOR (IV SOLUTION) IMPLANT
SPIKE FLUID TRANSFER (MISCELLANEOUS) ×1 IMPLANT
STEM POLY PAT PLY 32M KNEE (Knees) IMPLANT
STEM TIBIA 5 DEG SZ D L KNEE (Knees) IMPLANT
STRIP CLOSURE SKIN 1/2X4 (GAUZE/BANDAGES/DRESSINGS) ×1 IMPLANT
SUT MNCRL AB 3-0 PS2 18 (SUTURE) ×1 IMPLANT
SUT STRATAFIX 0 PDS 27 VIOLET (SUTURE) ×1
SUT STRATAFIX PDO 1 14 VIOLET (SUTURE) ×1
SUT STRATFX PDO 1 14 VIOLET (SUTURE) ×1
SUT VIC AB 1 CT1 27 (SUTURE) ×1
SUT VIC AB 1 CT1 27XBRD ANTBC (SUTURE) IMPLANT
SUT VIC AB 2-0 CT2 27 (SUTURE) ×2 IMPLANT
SUTURE STRATFX 0 PDS 27 VIOLET (SUTURE) ×1 IMPLANT
SUTURE STRATFX PDO 1 14 VIOLET (SUTURE) ×1 IMPLANT
SYR 50ML LL SCALE MARK (SYRINGE) ×1 IMPLANT
TIBIA STEM 5 DEG SZ D L KNEE (Knees) ×1 IMPLANT
TRAY FOLEY MTR SLVR 14FR STAT (SET/KITS/TRAYS/PACK) IMPLANT
TUBE SUCTION HIGH CAP CLEAR NV (SUCTIONS) ×1 IMPLANT
WRAP KNEE MAXI GEL POST OP (GAUZE/BANDAGES/DRESSINGS) IMPLANT

## 2021-12-22 NOTE — Addendum Note (Signed)
Addended by: Brantley Persons A on: 12/22/2021 12:35 PM   Modules accepted: Orders

## 2021-12-22 NOTE — Progress Notes (Signed)
Orthopedic Tech Progress Note Patient Details:  Ariana [name] 22-May-1960 [id number]  Patient ID: Ariana [name], female   DOB: [birth year], 61 y.o.   MRN: [id number] Patient stated they did not need the bone foam, as they have one from their previous operation in August. [place] 12/22/2021, 1:22 PM

## 2021-12-22 NOTE — Evaluation (Signed)
Physical Therapy Evaluation Patient Details Name: Ariana [name] MRN: [id number] DOB: [birth year] Today's Date: 12/22/2021  History of Present Illness  61 yo female s/p L TKA on 12/22/21, PMH: R TKA on 08/25/21, CAD, DM, GERD, HLD, HTN  Clinical Impression  Pt is s/p TKA resulting in the deficits listed below (see PT Problem List).  Pt motivated to mobilize however residual effects of spinal present with decr sensation, decr proprioception present  in bil feet. Pt reports N and T in feet. Pt able to perfom SLR I'ly, glut sets and sensation intact.  Pt able to take a few steps forward however incr unsteadiness d/t numbness in feet. Pt safely returned to stretcher. Will attempt to mobilize further again later today.  Pt will benefit from skilled PT to increase their independence and safety with mobility to allow discharge to the venue listed below.         Recommendations for follow up therapy are one component of a multi-disciplinary discharge planning process, led by the attending physician.  Recommendations may be updated based on patient status, additional functional criteria and insurance authorization.  Follow Up Recommendations Follow physician's recommendations for discharge plan and follow up therapies      Assistance Recommended at Discharge Intermittent Supervision/Assistance  Patient can return home with the following  A little help with bathing/dressing/bathroom;Assistance with cooking/housework;Assist for transportation;Help with stairs or ramp for entrance;A little help with walking and/or transfers    Equipment Recommendations None recommended by PT  Recommendations for Other Services       Functional Status Assessment Patient has had a recent decline in their functional status and demonstrates the ability to make significant improvements in function in a reasonable and predictable amount of time.     Precautions / Restrictions Precautions Precautions:  Knee Restrictions Weight Bearing Restrictions: No Other Position/Activity Restrictions: WBAT      Mobility  Bed Mobility Overal bed mobility: Needs Assistance Bed Mobility: Supine to Sit, Sit to Supine     Supine to sit: Min guard Sit to supine: Min guard   General bed mobility comments: for safety, no physical assist    Transfers Overall transfer level: Needs assistance Equipment used: Rolling walker (2 wheels) Transfers: Sit to/from Stand Sit to Stand: Min assist, Min guard           General transfer comment: cues for hand placement, assist to rise and stabilize    Ambulation/Gait               General Gait Details: attempted 2 and a half steps forward , pt with decr sensation and decr proprioception bil feet causing unsteadiness; pt took step sback to stretcher d/t residual effects anesthesia  Stairs            Wheelchair Mobility    Modified Rankin (Stroke Patients Only)       Balance Overall balance assessment: Needs assistance Sitting-balance support: Feet unsupported, No upper extremity supported Sitting balance-Leahy Scale: Good     Standing balance support: Bilateral upper extremity supported, Reliant on assistive device for balance Standing balance-Leahy Scale: Poor                               Pertinent Vitals/Pain Pain Assessment Pain Assessment: Faces Faces Pain Scale: Hurts a little bit Pain Location: L knee Pain Descriptors / Indicators: Sore Pain Intervention(s): Limited activity within patient's tolerance, Monitored during session, Repositioned    Home Living Family/patient  expects to be discharged to:: Private residence Living Arrangements: Other relatives Available Help at Discharge: Family;Available 24 hours/day Type of Home: House Home Access: Level entry       Home Layout: One level Home Equipment: BSC/3in1;Cane - single point;Rollator (4 wheels);Tub bench;Rolling Walker (2 wheels)      Prior  Function Prior Level of Function : Independent/Modified Independent;Driving             Mobility Comments: ind ADLs Comments: ind     Hand Dominance        Extremity/Trunk Assessment   Upper Extremity Assessment Upper Extremity Assessment: Overall WFL for tasks assessed    Lower Extremity Assessment Lower Extremity Assessment: LLE deficits/detail LLE Deficits / Details: ankle WFL, kne extension and hip flexion 3/5; numbness and tingling  reported bil feet L>R       Communication   Communication: No difficulties  Cognition Arousal/Alertness: Awake/alert Behavior During Therapy: WFL for tasks assessed/performed Overall Cognitive Status: Within Functional Limits for tasks assessed                                          General Comments      Exercises Total Joint Exercises Ankle Circles/Pumps: AROM, Both, 10 reps Quad Sets: AROM, Left, 5 reps Gluteal Sets: Both, 5 reps Straight Leg Raises: AROM, Left, 5 reps   Assessment/Plan    PT Assessment Patient needs continued PT services  PT Problem List Decreased strength;Decreased range of motion;Decreased activity tolerance;Decreased balance;Decreased mobility;Decreased knowledge of use of DME       PT Treatment Interventions DME instruction;Therapeutic exercise;Gait training;Therapeutic activities;Functional mobility training;Patient/family education    PT Goals (Current goals can be found in the Care Plan section)  Acute Rehab PT Goals Patient Stated Goal: go home if possible PT Goal Formulation: With patient Time For Goal Achievement: 12/29/21 Potential to Achieve Goals: Good    Frequency 7X/week     Co-evaluation               AM-PAC PT "6 Clicks" Mobility  Outcome Measure Help needed turning from your back to your side while in a flat bed without using bedrails?: A Little Help needed moving from lying on your back to sitting on the side of a flat bed without using bedrails?: A  Little Help needed moving to and from a bed to a chair (including a wheelchair)?: A Little Help needed standing up from a chair using your arms (e.g., wheelchair or bedside chair)?: A Little Help needed to walk in hospital room?: A Lot Help needed climbing 3-5 steps with a railing? : Total 6 Click Score: 15    End of Session Equipment Utilized During Treatment: Gait belt Activity Tolerance: Patient tolerated treatment well Patient left: with call bell/phone within reach;in bed   PT Visit Diagnosis: Other abnormalities of gait and mobility (R26.89);Difficulty in walking, not elsewhere classified (R26.2)    Time: 5732-2025 PT Time Calculation (min) (ACUTE ONLY): 15 min   Charges:   PT Evaluation $PT Eval Low Complexity: [place], PT  Acute Rehab Dept [facility]) [phone]  [facility] Weekend Pager [facility] only)  [phone]  12/22/2021   [facility] 12/22/2021, 3:42 PM

## 2021-12-22 NOTE — Interval H&P Note (Signed)

## 2021-12-22 NOTE — Anesthesia Procedure Notes (Addendum)
Spinal  Patient location during procedure: OR Start time: 12/22/2021 10:35 AM End time: 12/22/2021 10:41 AM Reason for block: surgical anesthesia Staffing Performed: anesthesiologist  Anesthesiologist: Freddrick March, MD Performed by: Freddrick March, MD Authorized by: Freddrick March, MD   Preanesthetic Checklist Completed: patient identified, IV checked, risks and benefits discussed, surgical consent, monitors and equipment checked, pre-op evaluation and timeout performed Spinal Block Patient position: sitting Prep: DuraPrep and site prepped and draped Patient monitoring: cardiac monitor, continuous pulse ox and blood pressure Approach: midline Location: L3-4 Injection technique: single-shot Needle Needle type: Quincke  Needle gauge: 22 G Needle length: 9 cm Assessment Sensory level: T6 Events: CSF return Additional Notes Functioning IV was confirmed and monitors were applied. Sterile prep and drape, including hand hygiene and sterile gloves were used. The patient was positioned and the spine was prepped. The skin was anesthetized with lidocaine.  Free flow of clear CSF was obtained prior to injecting local anesthetic into the CSF.  The spinal needle aspirated freely following injection.  The needle was carefully withdrawn.  The patient tolerated the procedure well.

## 2021-12-22 NOTE — Anesthesia Postprocedure Evaluation (Signed)
Anesthesia Post Note  Patient: Ariana [name]  Procedure(s) Performed: TOTAL KNEE ARTHROPLASTY (Left: Knee)     Patient location during evaluation: PACU Anesthesia Type: Regional and Spinal Level of consciousness: oriented and awake and alert Pain management: pain level controlled Vital Signs Assessment: post-procedure vital signs reviewed and stable Respiratory status: spontaneous breathing, respiratory function stable and patient connected to nasal cannula oxygen Cardiovascular status: blood pressure returned to baseline and stable Postop Assessment: no headache, no backache and no apparent nausea or vomiting Anesthetic complications: no  No notable events documented.  Last Vitals:  Vitals:   12/22/21 1345 12/22/21 1400  BP: 110/63 (!) 122/55  Pulse: 72 73  Resp: 15 15  Temp:  36.4 C  SpO2: 95% 95%    Last Pain:  Vitals:   12/22/21 1400  TempSrc:   PainSc: 0-No pain                 Benjerman Molinelli L Marlet Korte

## 2021-12-22 NOTE — Transfer of Care (Signed)
Immediate Anesthesia Transfer of Care Note  Patient: Ariana [name]  Procedure(s) Performed: TOTAL KNEE ARTHROPLASTY (Left: Knee)  Patient Location: PACU  Anesthesia Type:MAC combined with regional for post-op pain  Level of Consciousness: awake, oriented, drowsy, and patient cooperative  Airway & Oxygen Therapy: Patient Spontanous Breathing and Patient connected to face mask oxygen  Post-op Assessment: Report given to RN and Post -op Vital signs reviewed and stable  Post vital signs: stable  Last Vitals:  Vitals Value Taken Time  BP 112/61 12/22/21 1254  Temp    Pulse 70 12/22/21 1255  Resp 19 12/22/21 1256  SpO2 100 % 12/22/21 1255  Vitals shown include unvalidated device data.  Last Pain:  Vitals:   12/22/21 1000  TempSrc:   PainSc: 0-No pain         Complications: No notable events documented.

## 2021-12-22 NOTE — Progress Notes (Signed)
PT TX NOTE  12/22/21 1700  PT Visit Information  Last PT Received On 12/22/21  Assistance Needed   Pt progressing this session, reports some residual numbness only in her L heel.  Excellent progress this session, progression of gait, no knee buckling, no LOB. Pt reports her brother will be with heart home (he assisted her after her last TKA), reviewed HEP--exercises to perform until her OPPT appt on Wednesday. Pt feels ready to d/c with family assist as needed  History of Present Illness 61 yo female s/p L TKA on 12/22/21, PMH: R TKA on 08/25/21, CAD, DM, GERD, HLD, HTN  Subjective Data  Patient Stated Goal go home if possible  Precautions  Precautions Knee  Restrictions  Weight Bearing Restrictions No  Other Position/Activity Restrictions WBAT  Pain Assessment  Pain Assessment Faces  Faces Pain Scale 2  Pain Location L knee  Pain Descriptors / Indicators Sore  Pain Intervention(s) Limited activity within patient's tolerance;Monitored during session;Repositioned  Cognition  Arousal/Alertness Awake/alert  Behavior During Therapy WFL for tasks assessed/performed  Overall Cognitive Status Within Functional Limits for tasks assessed  Bed Mobility  Overal bed mobility Needs Assistance  Bed Mobility Supine to Sit;Sit to Supine  Supine to sit Min guard  Sit to supine Min guard  General bed mobility comments for safety, no physical assist  Transfers  Overall transfer level Needs assistance  Equipment used Rolling walker (2 wheels)  Transfers Sit to/from Stand  Sit to Stand Min guard;Supervision  General transfer comment cues for hand placement, min/guard for safety and; transfers from varied ht surfaces  Ambulation/Gait  Ambulation/Gait assistance Min guard;Supervision  Gait Distance (Feet) 80 Feet  Assistive device Rolling walker (2 wheels)  Gait Pattern/deviations Step-to pattern;Decreased stance time - left  General Gait Details cues for sequence and RW position  Balance   Sitting-balance support Feet unsupported;No upper extremity supported  Sitting balance-Leahy Scale Good  Standing balance support Bilateral upper extremity supported;Reliant on assistive device for balance  Standing balance-Leahy Scale Poor  PT - End of Session  Equipment Utilized During Treatment Gait belt  Activity Tolerance Patient tolerated treatment well  Patient left with call bell/phone within reach;in bed  Nurse Communication Mobility status   PT - Assessment/Plan  PT Plan Current plan remains appropriate  PT Visit Diagnosis Other abnormalities of gait and mobility (R26.89);Difficulty in walking, not elsewhere classified (R26.2)  PT Frequency (ACUTE ONLY) 7X/week  Follow Up Recommendations Follow physician's recommendations for discharge plan and follow up therapies  Assistance recommended at discharge Intermittent Supervision/Assistance  Patient can return home with the following A little help with bathing/dressing/bathroom;Assistance with cooking/housework;Assist for transportation;Help with stairs or ramp for entrance;A little help with walking and/or transfers  PT equipment None recommended by PT  AM-PAC PT "6 Clicks" Mobility Outcome Measure (Version 2)  Help needed turning from your back to your side while in a flat bed without using bedrails? 3  Help needed moving from lying on your back to sitting on the side of a flat bed without using bedrails? 3  Help needed moving to and from a bed to a chair (including a wheelchair)? 3  Help needed standing up from a chair using your arms (e.g., wheelchair or bedside chair)? 3  Help needed to walk in hospital room? 3  Help needed climbing 3-5 steps with a railing?  3  6 Click Score 18  Consider Recommendation of Discharge To: Home with [facility]  Progressive Mobility  What is the highest level of mobility  based on the progressive mobility assessment? Level 5 (Walks with assist in room/hall) - Balance while stepping forward/back and can walk  in room with assist - Complete  PT Goal Progression  Progress towards PT goals Progressing toward goals  Acute Rehab PT Goals  PT Goal Formulation With patient  Time For Goal Achievement 12/29/21  Potential to Achieve Goals Good  PT Time Calculation  PT Start Time (ACUTE ONLY) 1658  PT Stop Time (ACUTE ONLY) 1719  PT Time Calculation (min) (ACUTE ONLY) 21 min  PT General Charges  $$ ACUTE PT VISIT 1 Visit  PT Treatments  $Gait Training 8-22 mins

## 2021-12-22 NOTE — Discharge Instructions (Signed)

## 2021-12-22 NOTE — Op Note (Signed)
DATE OF SURGERY:  12/22/2021 TIME: 12:51 PM  PATIENT NAME:  Ariana [name]   AGE: 61 y.o.    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   OPERATIVE IMPLANTS:  Zimmer persona size 6 narrow CR femur, 5 degree tibia, 35 mm patella, 6m MC polyliner Implant Name Type Inv. Item Serial No. Manufacturer Lot No. LRB No. Used Action  CEMENT BONE REFOBACIN [id number] [place]- [id number] CEMENT BONE REFOBACIN [id number] [place] [employer]MER [employer](ORTH,TRAU,BIO,SG) [id number] 2 Implanted  KNEE SYSTEM FEMUR SZ 6 LT - [id number] KNEE SYSTEM FEMUR SZ 6 LT  ZIMMER [employer]ON(ORTH,TRAU,BIO,SG) [id number] 1 Implanted  TIBIA STEM 5 DEG SZ D L KNEE - [id number] TIBIA STEM 5 DEG SZ D L KNEE  ZIMMER [employer]ON(ORTH,TRAU,BIO,SG) [id number] 1 Implanted  STEM POLY PAT PLY 71M KNEE - [id number] STEM POLY PAT PLY 71M KNEE  [employer]ON(ORTH,TRAU,BIO,SG) [id number] 1 Implanted  COMP MED POLY AS PERS S6-7 12 - [id number] COMP MED POLY AS PERS S6-7 12  Z[employer]MER [employer]ON(ORTH,TRAU,BIO,SG) [id number] 1 Implanted      PREOPERATIVE INDICATIONS:  CDARNICE COMRIEis a 61y.o. year old female with end stage bone on bone degenerative arthritis of the knee who failed conservative treatment, including injections, antiinflammatories, activity modification, and assistive devices, and had significant impairment of their activities of daily living, and elected for Total Knee Arthroplasty.   The risks, benefits, and alternatives were discussed at length including but not limited to the risks of infection, bleeding, nerve injury, stiffness, blood clots, the need for revision surgery, cardiopulmonary  complications, among others, and they were willing to proceed.  ESTIMATED BLOOD LOSS: 25cc  OPERATIVE DESCRIPTION:   Once adequate anesthesia was induced, preoperative antibiotics, 2 gm of ancef,1 gm of Tranexamic Acid, and 8 mg of Decadron administered, the patient was positioned supine with a left thigh tourniquet placed.  The left lower extremity was prepped and draped in sterile fashion.  A time-  out was performed identifying the patient, planned procedure, and the appropriate extremity.     The leg was  exsanguinated, tourniquet elevated to 250 mmHg.  A midline incision was  made followed by median parapatellar arthrotomy. Anterior horn of the medial meniscus was released and resected. A medial release was performed, the infrapatellar fat pad was resected with care taken to protect the patellar tendon. The suprapatellar fat was removed to exposed the distal anterior femur. The anterior horn of the lateral meniscus and ACL were released.    Following initial  exposure, I first started with the femur  The femoral  canal was opened with a drill, canal was suctioned to try to prevent fat emboli.  An  intramedullary rod was passed set at 5 degrees valgus, 11 mm given. The distal femur was resected.  Following this resection, the tibia was  subluxated anteriorly.  Using the extramedullary guide, 10 mm of bone was resected off  the proximal lateral tibia.  The extension gap folowing this resection was tight and deficient still medially so elected to resect additional 222moff the proximal tibia. Following this resection, we confirmed the gap would be  stable medially and laterally with a size 1065mpacer block as well as confirmed  that the tibial cut was perpendicular in the coronal plane, checking with an alignment rod.    Once this was done, the posterior femoral referencing femoral sizer was placed under to the posterior condyles with 3 degrees of external rotational which was parallel to the  transepicondylar axis and perpendicular to [employer]. The femur was sized to be a size 6 in the anterior-  posterior dimension. The  anterior, posterior, and  chamfer cuts were made without difficulty nor   notching making certain that I was along the anterior cortex to help  with flexion gap stability. Next a laminar spreader was placed with the knee in flexion and the medial lateral menisci were resected.  5 cc of the Exparel mixture was injected in the medial side of the back of the knee and 3 cc in the lateral side.  1/2 inch curved osteotome was used to resect posterior osteophyte that was then removed with a pituitary rongeur.       At this point, the tibia was sized to be a size D.  The size D tray was  then pinned in position. Trial reduction was now carried with a 6 femur, D tibia, a 10 mm MC insert. The knee had relative hyperextesion so increased the liner to a size 65m. The knee had full extension and was stable to varus valgus stress in extension.  The PCL was left intact.  Attention was next directed to the patella.  Precut  measurement was noted to be 21 mm.  I resected down to 12 mm and used a  338mpatellar button to restore patellar height as well as cover the cut surface.     The patella lug holes were drilled and a 325matella poly trial was placed.    The knee was brought to full extension with good flexion stability with the patella tracking through the trochlea without application of pressure.     Next the femoral component was again assessed and determined to be seated and appropriately lateralized.  The femoral lug holes were drilled.  The femoral component was then removed. Tibial component was again assessed and felt to be seated and appropriately rotated with the medial third of the tubercle. The tibia was then drilled, and keel punched.     Final components were  opened and antibiotic cement was mixed.      Final implants were then  cemented onto cleaned and  dried cut surfaces of bone with the knee brought to extension with a 12 mm MC poly.  The knee was irrigated with sterile Betadine diluted in saline as well as pulse lavage normal saline. The synovial lining was  then injected a dilute Exparel.      Once the cement had fully cured, excess cement was removed throughout the knee.  I confirmed that I was satisfied with the range of motion and stability, and the final 12 mm MC poly insert was chosen.  It was placed into the knee.         The tourniquet had been let down.  No significant hemostasis was required.  The medial parapatellar arthrotomy was then reapproximated using #1 Vicryl and #1 Stratafix sutures with the knee  in flexion.  The remaining wound was closed with 0 stratafix, 2-0 Vicryl, and running 3-0 Monocryl. The knee was cleaned, dried, dressed sterilely using Dermabond and   Aquacel dressing.  The patient was then brought to recovery room in stable condition, tolerating the procedure  well. There  were no complications.   Post op recs: WB: WBAT Abx: ancef Imaging: PACU xrays DVT prophylaxis: Aspirin '81mg'$  BID x4 weeks Follow up: 2 weeks after surgery for a wound check with Dr. Zachery Dakins at [facility].  Address: [address], [place], [place] [address]  Office Phone: [phone]  Charlies Constable, MD Orthopaedic Surgery

## 2021-12-22 NOTE — Anesthesia Procedure Notes (Addendum)
Anesthesia Regional Block: Adductor canal block   Pre-Anesthetic Checklist: , timeout performed,  Correct Patient, Correct Site, Correct Laterality,  Correct Procedure, Correct Position, site marked,  Risks and benefits discussed,  Pre-op evaluation,  At surgeon's request and post-op pain management  Laterality: Left  Prep: Maximum Sterile Barrier Precautions used, chloraprep       Needles:  Injection technique: Single-shot  Needle Type: Echogenic Stimulator Needle     Needle Length: 9cm  Needle Gauge: 21     Additional Needles:   Procedures:,,,, ultrasound used (permanent image in chart),,    Narrative:  Start time: 12/22/2021 9:55 AM End time: 12/22/2021 9:58 AM Injection made incrementally with aspirations every 5 mL. Anesthesiologist: Freddrick March, MD

## 2021-12-23 ENCOUNTER — Encounter (HOSPITAL_COMMUNITY): Payer: Self-pay | Admitting: Orthopedic Surgery

## 2022-01-02 ENCOUNTER — Telehealth: Payer: Self-pay | Admitting: Family Medicine

## 2022-01-02 NOTE — Telephone Encounter (Signed)
Caller name: AELLA RONDA  On DPR?: Yes  Call back number: [phone] (mobile)  Provider they see: Midge Minium, MD  Reason for call:  Patient called stating she is having server diarrhea. Patient had to cancel her appt because of uncontrollable diarrhea. Patient stated that she can't stand up with out pooping on herself. Patient tried to go to the Er and she couldn't make it to her car with out pooping. Pt wants know if she can get any medication to help with this diarrhea. Patient also states that stomach is currently cramping. Patient has a heating pad on your stomach currently to help with the cramps. Patient stated  that the diarrhea started Wednesday morning. Patient states that her brother will have to pick up medication up from pharmacy.  Patient pharmacy is  [id number]- [place], [place] - [address]

## 2022-01-02 NOTE — Telephone Encounter (Signed)
Rescheduled for a virtual

## 2022-01-07 ENCOUNTER — Telehealth (INDEPENDENT_AMBULATORY_CARE_PROVIDER_SITE_OTHER): Payer: BC Managed Care – PPO | Admitting: Family Medicine

## 2022-01-07 ENCOUNTER — Ambulatory Visit: Payer: BC Managed Care – PPO | Admitting: Family Medicine

## 2022-01-07 ENCOUNTER — Encounter: Payer: Self-pay | Admitting: Family Medicine

## 2022-01-07 DIAGNOSIS — R197 Diarrhea, unspecified: Secondary | ICD-10-CM

## 2022-01-07 NOTE — Progress Notes (Unsigned)
Virtual Visit via Video   I connected with patient on 01/07/22 at  2:20 PM EST by a video enabled telemedicine application and verified that I am speaking with the correct person using two identifiers.  Location patient: Home Location provider: [facility] Persons participating in the virtual visit: Patient, Provider, Refugio Marcille Blanco C)  I discussed the limitations of evaluation and management by telemedicine and the availability of in person appointments. The patient expressed understanding and agreed to proceed.  Subjective:   HPI:   Diarrhea- pt had knee surgery on 12/18.  On 12/27 developed diarrhea and was not able to control it.  Took a box of Imodium and most of a box of Pepto in order to get it under control.  Diarrhea has been controlled since Monday.  At yesterday's post-op they noted infection and started on Cefadroxil BID.  Pt thought sxs were improving but states stomach is 'growling' again.    ROS:   See pertinent positives and negatives per HPI.  Patient Active Problem List   Diagnosis Date Noted   Preoperative clearance 12/19/2021   Apnea 07/24/2021   Mild intermittent asthma 01/60/1093   Diastolic heart failure ([place]) 07/24/2019   Type 2 diabetes mellitus without complication, without long-term current use of insulin ([place]) 05/12/2018   DOE (dyspnea on exertion) 07/30/2017   GERD (gastroesophageal reflux disease) 07/22/2017   Vitamin D deficiency 03/22/2017   Degenerative arthritis of knee, bilateral 03/22/2017   Peripheral edema 07/28/2016   Urticaria 07/28/2016   Physical exam 03/05/2016   Allergic rhinitis 10/31/2015   Severe obesity (BMI >= 40) (HCC) 08/29/2015   Anxiety and depression 08/29/2015   Heart disease 08/29/2015   Hypercholesterolemia 08/29/2015    Social History   Tobacco Use   Smoking status: Former    Types: Cigarettes    Quit date: 01/18/1981    Years since quitting: 40.9   Smokeless tobacco: Never   Tobacco comments:     pt states she smoker for 3 years in high school (10/28/17)  Substance Use Topics   Alcohol use: No    Current Outpatient Medications:    acetaminophen (TYLENOL) 500 MG tablet, Take 1,000 mg by mouth every 8 (eight) hours as needed for moderate pain., Disp: , Rfl:    albuterol (VENTOLIN HFA) 108 (90 Base) MCG/ACT inhaler, Inhale 2 puffs into the lungs every 6 (six) hours as needed for wheezing or shortness of breath., Disp: 18 g, Rfl: 6   ALPRAZolam (XANAX) 1 MG tablet, TAKE 1/2 TABLET BY MOUTH TWICE A DAY AS NEEDED FOR ANXIETY, Disp: 30 tablet, Rfl: 3   amLODipine (NORVASC) 5 MG tablet, TAKE 1 TABLET BY MOUTH EVERY DAY, Disp: 90 tablet, Rfl: 1   bisoprolol (ZEBETA) 5 MG tablet, TAKE 1 TABLET (5 MG TOTAL) BY MOUTH DAILY., Disp: 90 tablet, Rfl: 1   buPROPion (WELLBUTRIN XL) 300 MG 24 hr tablet, TAKE 1 TABLET BY MOUTH EVERY DAY, Disp: 90 tablet, Rfl: 1   cefadroxil (DURICEF) 500 MG capsule, Take 500 mg by mouth 2 (two) times daily., Disp: , Rfl:    furosemide (LASIX) 20 MG tablet, TAKE 1 TABLET BY MOUTH EVERY DAY AS NEEDED, Disp: 90 tablet, Rfl: 1   JARDIANCE 10 MG TABS tablet, TAKE 1 TABLET BY MOUTH EVERY DAY, Disp: 30 tablet, Rfl: 6   nystatin-triamcinolone ointment (MYCOLOG), Apply 1 Application topically 2 (two) times daily as needed (yeast infections)., Disp: , Rfl:    omeprazole (PRILOSEC) 40 MG capsule, TAKE 1 CAPSULE BY  MOUTH EVERY DAY, Disp: 90 capsule, Rfl: 1   rosuvastatin (CRESTOR) 20 MG tablet, TAKE 1 TABLET BY MOUTH EVERY DAY, Disp: 90 tablet, Rfl: 3   Semaglutide,0.25 or 0.'5MG'$ /DOS, (OZEMPIC, 0.25 OR 0.5 MG/DOSE,) 2 MG/3ML SOPN, Inject 0.5 mg into the skin every Monday., Disp: 3 mL, Rfl: 3   spironolactone (ALDACTONE) 25 MG tablet, TAKE 1/2 TABLET BY MOUTH TWICE A DAY, Disp: 90 tablet, Rfl: 3   clindamycin (CLEOCIN) 300 MG capsule, Take 600 mg by mouth See admin instructions. Take 2 capsules (600 mg) by mouth 1 hour prior to dental appointment (Patient not taking: Reported on  01/07/2022), Disp: , Rfl:   Allergies  Allergen Reactions   Penicillins Hives and Rash    Objective:   There were no vitals taken for this visit. AAOx3, NAD NCAT, EOMI No obvious CN deficits Coloring WNL Pt is able to speak clearly, coherently without shortness of breath or increased work of breathing.  Thought process is linear.  Mood is appropriate.   Assessment and Plan:   Diarrhea- new.  Suspect her explosive sxs last week were due to a circulating GI bug and the severe sxs have resolved.  She is now on abx which could be a setup for a 2nd round of diarrhea so I encouraged a daily probiotic, yogurt if possible, lots of fluids, and taking abx w/ food.  Pt expressed understanding and is in agreement w/ plan.    Annye Asa, MD 01/07/2022

## 2022-01-19 ENCOUNTER — Other Ambulatory Visit: Payer: Self-pay | Admitting: Family Medicine

## 2022-01-27 ENCOUNTER — Other Ambulatory Visit: Payer: Self-pay | Admitting: Cardiovascular Disease

## 2022-01-28 ENCOUNTER — Encounter: Payer: Self-pay | Admitting: Cardiovascular Disease

## 2022-01-28 ENCOUNTER — Ambulatory Visit: Payer: BC Managed Care – PPO | Attending: Cardiovascular Disease | Admitting: Cardiovascular Disease

## 2022-01-28 DIAGNOSIS — I5189 Other ill-defined heart diseases: Secondary | ICD-10-CM

## 2022-01-28 DIAGNOSIS — E785 Hyperlipidemia, unspecified: Secondary | ICD-10-CM | POA: Diagnosis not present

## 2022-01-28 DIAGNOSIS — I2584 Coronary atherosclerosis due to calcified coronary lesion: Secondary | ICD-10-CM

## 2022-01-28 DIAGNOSIS — I2510 Atherosclerotic heart disease of native coronary artery without angina pectoris: Secondary | ICD-10-CM

## 2022-01-28 DIAGNOSIS — R600 Localized edema: Secondary | ICD-10-CM | POA: Diagnosis not present

## 2022-01-28 DIAGNOSIS — E119 Type 2 diabetes mellitus without complications: Secondary | ICD-10-CM | POA: Diagnosis not present

## 2022-01-28 NOTE — Patient Instructions (Signed)
Medication Instructions:   FOR 3 DAYS TAKE 20 MG OF FUROSEMIDE ONCE DAILY AND ONLY TAKE 1/2 SPIRONOLACTONE ONCE DAILY  AFTER 3 DAYS IF THE SWELLING IS BETTER-RETURN TO TWICE DAILY SPIRONOLACTONE AND FUROSEMIDE AS NEEDED  *If you need a refill on your cardiac medications before your next appointment, please call your pharmacy*   Follow-Up: At [facility], you and your health needs are our priority.  As part of our continuing mission to provide you with exceptional heart care, we have created designated Provider Care Teams.  These Care Teams include your primary Cardiologist (physician) and Advanced Practice Providers (APPs -  Physician Assistants and Nurse Practitioners) who all work together to provide you with the care you need, when you need it.  We recommend signing up for the patient portal called "MyChart".  Sign up information is provided on this After Visit Summary.  MyChart is used to connect with patients for Virtual Visits (Telemedicine).  Patients are able to view lab/test results, encounter notes, upcoming appointments, etc.  Non-urgent messages can be sent to your provider as well.   To learn more about what you can do with MyChart, go to [email or web address].    Your next appointment:   12 month(s)  Provider:   Shelva Majestic, MD

## 2022-01-28 NOTE — Progress Notes (Signed)
Cardiology Office Note    Date:  02/02/2022   ID:  Ariana, [name] [birth year], MRN [id number]  PCP:  Midge Minium, MD  Cardiologist:  Shelva Majestic, MD   3 YEAR f/u EVALUATION   History of Present Illness:  Ariana [name] is a 62 y.o. female who was initially referred through the courtesy of Dr. Birdie Riddle for evaluation of exertional shortness of breath and recent documentation of grade 2 diastolic dysfunction.  I initially saw her in October 2019.  I last saw her in January 2021.   Ms. Ariana [name] denies any known cardiac history.  She has a history of obesity, hypertension, obstructive sleep apnea, GERD, and recently has started to notice a change in symptoms with development of exertional shortness of breath.  She had smoked during her late teenage years for approximately 3 to 4 years but quit in 1982.  She was evaluated from a pulmonary standpoint by Drs. Elsworth Soho and Daine Gravel.  Pulmonary function studies did not reveal evidence for obstruction.  She had mild small airway disease.  An echo Doppler study revealed an ejection fraction of 60 to 65% with normal wall motion and showed grade 2 diastolic dysfunction.  A chest x-ray did not reveal acute cardiopulmonary disease.  A d-dimer was negative.  BNP was 41.  Upon further questioning she states that recently she also has noticed some mild exertional chest tightness since April.  Oftentimes she develops this when she is out of breath.  If she were to walk fast she will have to stop to catch her breath.  She lives by herself.  Earlier this year she had laboratory which had shown a total cholesterol 171 LDL cholesterol 97 with triglycerides at 101.  At that time her potassium was low at 3.2.  She has recently been on amlodipine 5 mg and hydrochlorothiazide 25 mg in addition to supplemental potassium 20 mEq.  She has been on simvastatin 40 mg for hyperlipidemia.  She has been taking Lasix as needed.  She does have a history of panic  attacks and anxiety and depression for which she is on Xanax as well as Wellbutrin XL and Prozac.    During her initial evaluation with me in October 2019 and with her grade 2 diastolic dysfunction I recommended discontinuance of hydrochlorothiazide and supplemental pad potassium and initiate spironolactone therapy.  With her father dying of an MI at age 13 and symptoms of exertional dyspnea with chest fullness I recommended CT coronary angio for further evaluation.  Her LDL cholesterol was 97 I recommend more aggressive lipid therapy with discontinuance of simvastatin and changing to rosuvastatin.  She was seen by Almyra Deforest, PA in December 2019 for follow-up evaluation.  Her coronary CTA showed a minimal calcium score of 3.89.  She was not found to have evidence for obstructive CAD with very minimal plaque in the proximal LAD.  I last saw her on January 23, 2019. She has been seen by Dr. Elsworth Soho and was started on inhaler therapy for asthma.  She also was started on Jardiance for diabetes mellitus and had a hemoglobin A1c of 7.3.  She has noticed some trace ankle edema.  At times she has noticed an occasional palpitation.  She is now on Symbicort and last week she was recently started on furosemide 20 mg.  She is on spironolactone 12.5 mg twice a day in addition to Jardiance 10 mg daily and amlodipine 5 mg.  She is now on rosuvastatin 20 mg  for hyperlipidemia.   Ariana [name] is status post bilateral knee replacements with her initial right knee replacement on August 25, 2021.  She was recently evaluated by Dr. Quay Burow on December 19, 2021 for preoperative evaluation prior to undergoing elective left total knee replacement surgery on December 22, 2021.  Presently, Ariana [name] denies any chest pain but admits to lower extremity edema.  She has lost approximately 60 pounds of weight with a peak weight of 267 down most recently 206.  She is followed by Dr. Birdie Riddle for primary care.  She is on amlodipine 5  mg, furosemide 20 mg as needed, bisoprolol 5 mg daily, spironolactone 12.5 mg twice a day and takes Jardiance 10 mg daily.  She is also on Ozempic 0.5 mg weekly injection.  She denies any chest pain, PND orthopnea, presyncope or syncope.  She presents for 3-year follow-up cardiology evaluation.  Past Medical History:  Diagnosis Date   Anxiety    Coronary artery disease    on CT   Depression    Diabetes mellitus without complication ([place])    Dyspnea    pt denies at preop on 12/09/21   GERD (gastroesophageal reflux disease)    Hyperlipidemia    Hypertension    OA (osteoarthritis)    in Bil knees/ gets steroid injections for pain   Ovarian cyst    right ovary/had ablation    Past Surgical History:  Procedure Laterality Date   COLONOSCOPY     ENDOMETRIAL ABLATION  2012   OVARIAN CYST REMOVAL Right 2012   TOTAL KNEE ARTHROPLASTY Right 08/25/2021   Procedure: TOTAL KNEE ARTHROPLASTY;  Surgeon: Willaim Sheng, MD;  Location: [facility];  Service: Orthopedics;  Laterality: Right;   TOTAL KNEE ARTHROPLASTY Left 12/22/2021   Procedure: TOTAL KNEE ARTHROPLASTY;  Surgeon: Willaim Sheng, MD;  Location: [facility];  Service: Orthopedics;  Laterality: Left;   WISDOM TOOTH EXTRACTION      Current Medications: Outpatient Medications Prior to Visit  Medication Sig Dispense Refill   acetaminophen (TYLENOL) 500 MG tablet Take 1,000 mg by mouth every 8 (eight) hours as needed for moderate pain.     ALPRAZolam (XANAX) 1 MG tablet TAKE 1/2 TABLET BY MOUTH TWICE A DAY AS NEEDED FOR ANXIETY 30 tablet 3   amLODipine (NORVASC) 5 MG tablet TAKE 1 TABLET BY MOUTH EVERY DAY 90 tablet 1   bisoprolol (ZEBETA) 5 MG tablet TAKE 1 TABLET (5 MG TOTAL) BY MOUTH DAILY. 90 tablet 1   buPROPion (WELLBUTRIN XL) 300 MG 24 hr tablet TAKE 1 TABLET BY MOUTH EVERY DAY 90 tablet 1   furosemide (LASIX) 20 MG tablet TAKE 1 TABLET BY MOUTH EVERY DAY AS NEEDED 90 tablet 1   JARDIANCE 10 MG TABS tablet TAKE 1 TABLET BY  MOUTH EVERY DAY 30 tablet 6   nystatin-triamcinolone ointment (MYCOLOG) Apply 1 Application topically 2 (two) times daily as needed (yeast infections).     omeprazole (PRILOSEC) 40 MG capsule TAKE 1 CAPSULE BY MOUTH EVERY DAY 90 capsule 1   rosuvastatin (CRESTOR) 20 MG tablet TAKE 1 TABLET BY MOUTH EVERY DAY 90 tablet 3   Semaglutide,0.25 or 0.'5MG'$ /DOS, (OZEMPIC, 0.25 OR 0.5 MG/DOSE,) 2 MG/3ML SOPN Inject 0.5 mg into the skin every Monday. 3 mL 3   spironolactone (ALDACTONE) 25 MG tablet TAKE 1/2 TABLET BY MOUTH TWICE A DAY 90 tablet 3   cefadroxil (DURICEF) 500 MG capsule Take 500 mg by mouth 2 (two) times daily. (Patient not taking: Reported on 01/28/2022)  albuterol (VENTOLIN HFA) 108 (90 Base) MCG/ACT inhaler Inhale 2 puffs into the lungs every 6 (six) hours as needed for wheezing or shortness of breath. 18 g 6   clindamycin (CLEOCIN) 300 MG capsule Take 600 mg by mouth See admin instructions. Take 2 capsules (600 mg) by mouth 1 hour prior to dental appointment (Patient not taking: Reported on 01/07/2022)     No facility-administered medications prior to visit.     Allergies:   Penicillins   Social History   Socioeconomic History   Marital status: Divorced    Spouse name: Not on file   Number of children: Not on file   Years of education: Not on file   Highest education level: Not on file  Occupational History   Not on file  Tobacco Use   Smoking status: Former    Types: Cigarettes    Quit date: 01/18/1981    Years since quitting: 41.0   Smokeless tobacco: Never   Tobacco comments:    pt states she smoker for 3 years in high school (10/28/17)  Vaping Use   Vaping Use: Never used  Substance and Sexual Activity   Alcohol use: No   Drug use: No   Sexual activity: Never  Other Topics Concern   Not on file  Social History Narrative   Not on file   Social Determinants of Health   Financial Resource Strain: Not on file  Food Insecurity: Not on file  Transportation Needs:  Not on file  Physical Activity: Not on file  Stress: Not on file  Social Connections: Not on file    She is separated and single.  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  There is no alcohol use.  She does not routinely exercise.  Family History:  The patient's family history includes Arthritis in her brother and father; Cancer in her maternal aunt; Dementia in her mother; Diabetes in her brother; Heart attack in her father; Heart disease in her brother and father; Hyperlipidemia in her father; Hypertension in her father; Liver disease in her brother.   Her mother died at age 72 with dementia and had thyroid problems.  Her father died with a heart attack at age 2.  She has been depressed since her son age 92 died in a motor vehicle accident.  ROS General: Negative; No fevers, chills, or night sweats;  HEENT: Negative; No changes in vision or hearing, sinus congestion, difficulty swallowing Pulmonary: Negative; No cough, wheezing, shortness of breath, hemoptysis Cardiovascular: Positive for recent diagnosis of asthma GI: Negative; No nausea, vomiting, diarrhea, or abdominal pain GU: Negative; No dysuria, hematuria, or difficulty voiding; status post right ovary and cyst removed in 2009 Musculoskeletal: Negative; no myalgias, joint pain, or weakness Hematologic/Oncology: Negative; no easy bruising, bleeding Endocrine: Negative; no heat/cold intolerance; no diabetes Neuro: Negative; no changes in balance, headaches Skin: Negative; No rashes or skin lesions Psychiatric: Positive for anxiety and depression Sleep: Negative; No snoring, daytime sleepiness, hypersomnolence, bruxism, restless legs, hypnogognic hallucinations, no cataplexy Other comprehensive 14 point system review is negative.   PHYSICAL EXAM:   VS:  BP 100/62   Pulse 73   Ht '5\' 3"'$  (1.6 m)   Wt 206 lb 3.7 oz (93.5 kg)   SpO2 96%   BMI 36.53 kg/m     Repeat blood pressure by me  106/64  Wt Readings from Last 3 Encounters:  01/28/22 206 lb 3.7 oz (93.5 kg)  12/22/21 205 lb (93  kg)  12/19/21 205 lb (93 kg)    When last seen by me in January 2021 her weight was 267 pounds.  General: Alert, oriented, no distress.  Skin: normal turgor, no rashes, warm and dry HEENT: Normocephalic, atraumatic. Pupils equal round and reactive to light; sclera anicteric; extraocular muscles intact;  Nose without nasal septal hypertrophy Mouth/Parynx benign; Mallinpatti scale 3 Neck: No JVD, no carotid bruits; normal carotid upstroke Lungs: clear to ausculatation and percussion; no wheezing or rales Chest wall: without tenderness to palpitation Heart: PMI not displaced, RRR, s1 s2 normal, 1/6 systolic murmur, no diastolic murmur, no rubs, gallops, thrills, or heaves Abdomen: soft, nontender; no hepatosplenomehaly, BS+; abdominal aorta nontender and not dilated by palpation. Back: no CVA tenderness Pulses 2+ Musculoskeletal: full range of motion, normal strength, no joint deformities Extremities: Bilateral lower extremity edema left greater than right with minimal erythema.  Homans negative.   Neurologic: grossly nonfocal; Cranial nerves grossly wnl Psychologic: Normal mood and affect    Studies/Labs Reviewed:   January 28, 2021 ECG (independently read by me): NSR at 73, No ectopy.  January 23, 2019 ECG (independently read by me): Normal sinus rhythm at 95 bpm.  No ectopy.  Normal intervals.  October 2019 ECG (independently read by me): Normal sinus rhythm with isolated PVC.  Nonspecific ST-T changes.  PR interval 140 ms QTc interval 454 ms.  Recent Labs:    Latest Ref Rng & Units 12/09/2021   11:23 AM 08/12/2021   11:06 AM 06/24/2021   11:13 AM  BMP  Glucose 70 - 99 mg/dL 76  88  69   BUN 8 - 23 mg/dL '13  15  10   '$ Creatinine 0.44 - 1.00 mg/dL 0.93  0.95  1.01   Sodium 135 - 145 mmol/L 140  140  139   Potassium 3.5 - 5.1 mmol/L 4.0  3.5  3.9   Chloride 98 - 111 mmol/L 110   108  104   CO2 22 - 32 mmol/L '24  24  26   '$ Calcium 8.9 - 10.3 mg/dL 9.5  9.3  10.3         Latest Ref Rng & Units 06/24/2021   11:13 AM 11/21/2020    2:10 PM 01/30/2020    1:48 PM  Hepatic Function  Total Protein 6.0 - 8.3 g/dL 7.8  7.0  7.7   Albumin 3.5 - 5.2 g/dL 4.3  4.1  4.3   AST 0 - 37 U/L '22  20  28   '$ ALT 0 - 35 U/L '20  17  22   '$ Alk Phosphatase 39 - 117 U/L 77  80  98   Total Bilirubin 0.2 - 1.2 mg/dL 0.4  0.5  0.4   Bilirubin, Direct 0.0 - 0.3 mg/dL 0.1  0.2  0.1        Latest Ref Rng & Units 12/09/2021   11:23 AM 08/12/2021   11:06 AM 06/24/2021   11:13 AM  CBC  WBC 4.0 - 10.5 K/uL 9.5  8.4  6.9   Hemoglobin 12.0 - 15.0 g/dL 12.9  12.9  13.7   Hematocrit 36.0 - 46.0 % 42.8  41.5  42.6   Platelets 150 - 400 K/uL 345  228  272.0    Lab Results  Component Value Date   MCV 83.4 12/09/2021   MCV 80.4 08/12/2021   MCV 77.4 (L) 06/24/2021   Lab Results  Component Value Date   TSH 1.37 06/24/2021   Lab Results  Component  Value Date   HGBA1C 5.4 12/09/2021     BNP No results found for: "BNP"  ProBNP    Component Value Date/Time   PROBNP 41.0 07/30/2017 1553     Lipid Panel     Component Value Date/Time   CHOL 111 06/24/2021 1113   TRIG 147.0 06/24/2021 1113   HDL 34.20 (L) 06/24/2021 1113   CHOLHDL 3 06/24/2021 1113   VLDL 29.4 06/24/2021 1113   LDLCALC 47 06/24/2021 1113   LDLDIRECT 76.0 01/30/2020 1348     RADIOLOGY: No results found.   Additional studies/ records that were reviewed today include:  I reviewed the office records of Dr. Annye Asa, the pulmonology evaluations, recent laboratory, echo Doppler assessment and PFTs. Coronary CTA was reviewed.  ASSESSMENT:    1. Bilateral lower extremity edema   2. Grade II diastolic dysfunction   3. Hyperlipidemia with target LDL less than 70   4. Type 2 diabetes mellitus without complication, without long-term current use of insulin ([place])   5. Minimal Coronary artery calcification      PLAN:  Ms. Deeana Atwater is a 62 year old female who has a history of morbid obesity, hypertension, anxiety/depression, and when I initially saw her in October 2019 had noticed a definite change with development of increasing exertional dyspnea and some exertional chest fullness and pressure.  She was not very active due to arthritis in her knees.  She was found to have grade 2 diastolic dysfunction on her echocardiography and had onset of EF at 60 to 65%.  She was taken off hydrochlorothiazide and has been on spironolactone currently at 12.5 mg twice a day.  She tells me she was just recently started also on furosemide and has recently started Symbicort.  When seen by me in January 2021 she was also on Jardiance for her diabetes as well as potential CHF.  She has lost between 60 and 70 pounds from her peak weight.  In 2023 she underwent successful bilateral knee replacement surgery with a right knee on August 25, 2021 and most recent left knee replacement on December 22, 2021.  She tolerated these from a cardiovascular standpoint.  Her blood pressure today is stable but on the low side.  She has been taking amlodipine 5 mg, bisoprolol 5 mg, Jardiance 10 mg and spironolactone 12.5 mg twice a day.  Although she has a prescription for furosemide 20 mg she has not been taking this.  I have suggested with her current leg swelling she take Lasix 20 mg for the next 3 days and then as needed.  With her blood pressure being low I have suggested she reduce her spironolactone to just 12.5 mg a day and  after the 3-day regimen of Lasix depending upon blood pressure and edema she can increase her spironolactone back to 12.5 twice daily if necessary.  If blood pressure remains low, she may benefit from future reduction of amlodipine dose to 2.5 mg.  She continues to be on rosuvastatin 20 mg for hyperlipidemia.  LDL cholesterol in June 2023 was excellent at 47.  She will be following up with Dr. Birdie Riddle who checks  laboratory.  I will see her in 1 year for reevaluation or sooner as needed.   Medication Adjustments/Labs and Tests Ordered: Current medicines are reviewed at length with the patient today.  Concerns regarding medicines are outlined above.  Medication changes, Labs and Tests ordered today are listed in the Patient Instructions below. Patient Instructions  Medication Instructions:   FOR 3  DAYS TAKE 20 MG OF FUROSEMIDE ONCE DAILY AND ONLY TAKE 1/2 SPIRONOLACTONE ONCE DAILY  AFTER 3 DAYS IF THE SWELLING IS BETTER-RETURN TO TWICE DAILY SPIRONOLACTONE AND FUROSEMIDE AS NEEDED  *If you need a refill on your cardiac medications before your next appointment, please call your pharmacy*   Follow-Up: At [facility], you and your health needs are our priority.  As part of our continuing mission to provide you with exceptional heart care, we have created designated Provider Care Teams.  These Care Teams include your primary Cardiologist (physician) and Advanced Practice Providers (APPs -  Physician Assistants and Nurse Practitioners) who all work together to provide you with the care you need, when you need it.  We recommend signing up for the patient portal called "MyChart".  Sign up information is provided on this After Visit Summary.  MyChart is used to connect with patients for Virtual Visits (Telemedicine).  Patients are able to view lab/test results, encounter notes, upcoming appointments, etc.  Non-urgent messages can be sent to your provider as well.   To learn more about what you can do with MyChart, go to [email or web address].    Your next appointment:   12 month(s)  Provider:   Shelva Majestic, MD        Signed, Shelva Majestic, MD  02/02/2022 11:00 AM    [facility] [address], [place], [place], [place]  [address] Phone: [phone]

## 2022-02-02 ENCOUNTER — Other Ambulatory Visit: Payer: Self-pay | Admitting: Cardiovascular Disease

## 2022-02-02 ENCOUNTER — Encounter: Payer: Self-pay | Admitting: Cardiovascular Disease

## 2022-02-18 ENCOUNTER — Encounter: Payer: Self-pay | Admitting: Family Medicine

## 2022-02-18 ENCOUNTER — Ambulatory Visit (INDEPENDENT_AMBULATORY_CARE_PROVIDER_SITE_OTHER): Payer: BC Managed Care – PPO | Admitting: Family Medicine

## 2022-02-18 VITALS — BP 130/78 | HR 66 | Temp 97.8°F | Resp 17 | Ht 63.0 in | Wt 204.2 lb

## 2022-02-18 DIAGNOSIS — Z0000 Encounter for general adult medical examination without abnormal findings: Secondary | ICD-10-CM

## 2022-02-18 DIAGNOSIS — E559 Vitamin D deficiency, unspecified: Secondary | ICD-10-CM | POA: Diagnosis not present

## 2022-02-18 DIAGNOSIS — E119 Type 2 diabetes mellitus without complications: Secondary | ICD-10-CM | POA: Diagnosis not present

## 2022-02-18 LAB — CBC WITH DIFFERENTIAL/PLATELET
Basophils Absolute: 0 10*3/uL (ref 0.0–0.1)
Basophils Relative: 0.2 % (ref 0.0–3.0)
Eosinophils Absolute: 0 10*3/uL (ref 0.0–0.7)
Eosinophils Relative: 0 % (ref 0.0–5.0)
HCT: 37.7 % (ref 36.0–46.0)
Hemoglobin: 12.1 g/dL (ref 12.0–15.0)
Lymphocytes Relative: 24.2 % (ref 12.0–46.0)
Lymphs Abs: 1.8 10*3/uL (ref 0.7–4.0)
MCHC: 32.1 g/dL (ref 30.0–36.0)
MCV: 77.3 fl — ABNORMAL LOW (ref 78.0–100.0)
Monocytes Absolute: 0.5 10*3/uL (ref 0.1–1.0)
Monocytes Relative: 6.4 % (ref 3.0–12.0)
Neutro Abs: 5.3 10*3/uL (ref 1.4–7.7)
Neutrophils Relative %: 69.2 % (ref 43.0–77.0)
Platelets: 228 10*3/uL (ref 150.0–400.0)
RBC: 4.89 Mil/uL (ref 3.87–5.11)
RDW: 16.8 % — ABNORMAL HIGH (ref 11.5–15.5)
WBC: 7.6 10*3/uL (ref 4.0–10.5)

## 2022-02-18 LAB — BASIC METABOLIC PANEL
BUN: 14 mg/dL (ref 6–23)
CO2: 24 mEq/L (ref 19–32)
Calcium: 9.5 mg/dL (ref 8.4–10.5)
Chloride: 107 mEq/L (ref 96–112)
Creatinine, Ser: 1.09 mg/dL (ref 0.40–1.20)
GFR: 54.11 mL/min — ABNORMAL LOW (ref >60.00)
Glucose, Bld: 74 mg/dL (ref 70–99)
Potassium: 3.8 mEq/L (ref 3.5–5.1)
Sodium: 139 mEq/L (ref 135–145)

## 2022-02-18 LAB — HEPATIC FUNCTION PANEL
ALT: 13 U/L (ref 0–35)
AST: 16 U/L (ref 0–37)
Albumin: 4 g/dL (ref 3.5–5.2)
Alkaline Phosphatase: 85 U/L (ref 39–117)
Bilirubin, Direct: 0.1 mg/dL (ref 0.0–0.3)
Total Bilirubin: 0.4 mg/dL (ref 0.2–1.2)
Total Protein: 7.2 g/dL (ref 6.0–8.3)

## 2022-02-18 LAB — LIPID PANEL
Cholesterol: 114 mg/dL (ref 0–200)
HDL: 29.8 mg/dL — ABNORMAL LOW (ref >39.00)
LDL Cholesterol: 47 mg/dL (ref 0–99)
NonHDL: 83.73
Total CHOL/HDL Ratio: 4
Triglycerides: 185 mg/dL — ABNORMAL HIGH (ref 0.0–149.0)
VLDL: 37 mg/dL (ref 0.0–40.0)

## 2022-02-18 LAB — MICROALBUMIN / CREATININE URINE RATIO
Creatinine,U: 177.5 mg/dL
Microalb Creat Ratio: 0.6 mg/g (ref 0.0–30.0)
Microalb, Ur: 1 mg/dL (ref 0.0–1.9)

## 2022-02-18 LAB — TSH: TSH: 1.88 u[IU]/mL (ref 0.35–5.50)

## 2022-02-18 LAB — VITAMIN D 25 HYDROXY (VIT D DEFICIENCY, FRACTURES): VITD: 12.24 ng/mL — ABNORMAL LOW (ref 30.00–100.00)

## 2022-02-18 NOTE — Assessment & Plan Note (Signed)
Check labs and replete prn. 

## 2022-02-18 NOTE — Assessment & Plan Note (Signed)
Last A1C in December prior to surgery was excellent!  UTD on eye exam, foot exam.  Microalbumin due soon- will get today.  Check labs.  Adjust meds prn

## 2022-02-18 NOTE — Progress Notes (Signed)
   Subjective:    Patient ID: Ariana [name], female    DOB: [birth year], 62 y.o.   MRN: [id number]  HPI CPE- UTD on mammo, colonoscopy, pap, Tdap, eye exam  Patient Care Team    Relationship Specialty Notifications Start End  Midge Minium, MD PCP - General Family Medicine  08/29/15   Troy Sine, MD PCP - Cardiology Cardiology  11/28/19   Rigoberto Noel, MD Consulting Physician Pulmonary Disease  05/05/18   Troy Sine, MD Consulting Physician Cardiology  05/05/18   Marylynn Pearson, MD Consulting Physician Obstetrics and Gynecology  05/05/18   Ledora Bottcher, Sanders Physician Assistant Cardiology  12/20/20     Health Maintenance  Topic Date Due   Diabetic kidney evaluation - Urine ACR  02/24/2022   HIV Screening  02/24/2022 (Originally 09/23/1975)   INFLUENZA VACCINE  04/05/2022 (Originally 08/05/2021)   FOOT EXAM  02/24/2022   OPHTHALMOLOGY EXAM  05/06/2022   HEMOGLOBIN A1C  06/10/2022   Diabetic kidney evaluation - eGFR measurement  12/10/2022   MAMMOGRAM  04/24/2023   PAP SMEAR-Modifier  04/23/2024   DTaP/Tdap/Td (2 - Td or Tdap) 08/28/2025   COLONOSCOPY (Pts 45-6yr Insurance coverage will need to be confirmed)  06/10/2026   Hepatitis C Screening  Completed   HPV VACCINES  Aged Out   COVID-19 Vaccine  Discontinued   Zoster Vaccines- Shingrix  Discontinued      Review of Systems Patient reports no vision/ hearing changes, adenopathy,fever, weight change,  persistant/recurrent hoarseness , swallowing issues, chest pain, palpitations, edema, persistant/recurrent cough, hemoptysis, dyspnea (rest/exertional/paroxysmal nocturnal), gastrointestinal bleeding (melena, rectal bleeding), abdominal pain, significant heartburn, bowel changes, GU symptoms (dysuria, hematuria, incontinence), Gyn symptoms (abnormal  bleeding, pain),  syncope, focal weakness, memory loss, numbness & tingling, skin/hair/nail changes, abnormal bruising or bleeding, anxiety, or depression.   +  bradycardia    Objective:   Physical Exam General Appearance:    Alert, cooperative, no distress, appears stated age, obese  Head:    Normocephalic, without obvious abnormality, atraumatic  Eyes:    PERRL, conjunctiva/corneas clear, EOM's intact both eyes  Ears:    Normal TM's and external ear canals, both ears  Nose:   Nares normal, septum midline, mucosa normal, no drainage    or sinus tenderness  Throat:   Lips, mucosa, and tongue normal; teeth and gums normal  Neck:   Supple, symmetrical, trachea midline, no adenopathy;    Thyroid: no enlargement/tenderness/nodules  Back:     Symmetric, no curvature, ROM normal, no CVA tenderness  Lungs:     Clear to auscultation bilaterally, respirations unlabored  Chest Wall:    No tenderness or deformity   Heart:    Regular rate and rhythm, S1 and S2 normal, no murmur, rub   or gallop  Breast Exam:    Deferred to GYN  Abdomen:     Soft, non-tender, bowel sounds active all four quadrants,    no masses, no organomegaly  Genitalia:    Deferred to GYN  Rectal:    Extremities:   Extremities normal, atraumatic, no cyanosis or edema  Pulses:   2+ and symmetric all extremities  Skin:   Skin color, texture, turgor normal, no rashes or lesions  Lymph nodes:   Cervical, supraclavicular, and axillary nodes normal  Neurologic:   CNII-XII intact, normal strength, sensation and reflexes    throughout          Assessment & Plan:

## 2022-02-18 NOTE — Assessment & Plan Note (Signed)
Pt's PE WNL w/ exception of BMI.  UTD on pap, mammo, colonoscopy, Tdap.  Check labs.  Anticipatory guidance provided.

## 2022-02-18 NOTE — Patient Instructions (Signed)
Follow up in 3-4 months to recheck sugar We'll notify you of your lab results and make any changes if needed DECREASE the Bisoprolol to 1/2 tab daily No other med changes at this time Continue to work on healthy diet and regular exercise- you can do it! Call with any questions or concern Stay Safe!  Stay Healthy! Happy Valentine's Day!!!

## 2022-02-19 ENCOUNTER — Telehealth: Payer: Self-pay

## 2022-02-19 ENCOUNTER — Other Ambulatory Visit: Payer: Self-pay

## 2022-02-19 ENCOUNTER — Telehealth: Payer: Self-pay | Admitting: Family Medicine

## 2022-02-19 DIAGNOSIS — R7989 Other specified abnormal findings of blood chemistry: Secondary | ICD-10-CM

## 2022-02-19 MED ORDER — VITAMIN D (ERGOCALCIFEROL) 1.25 MG (50000 UNIT) PO CAPS: 50000.0000 [IU] | ORAL_CAPSULE | ORAL | 12 refills | Status: DC

## 2022-02-19 NOTE — Telephone Encounter (Signed)
See previous phone note.  

## 2022-02-19 NOTE — Telephone Encounter (Signed)
Left pt a vm to call the office in regards to lab results . Vit d has been sent in

## 2022-02-19 NOTE — Telephone Encounter (Signed)
Caller name: DEZIRE PORTALES  On DPR?: Yes  Call back number: [phone] (mobile)  Provider they see: Midge Minium, MD  Reason for call: pt is calling wanting to have her lab results from yesterday.

## 2022-02-19 NOTE — Telephone Encounter (Signed)
-----   Message from Midge Minium, MD sent at 02/19/2022  7:36 AM EST ----- Labs look great w/ exception of low Vit D.  Based on this, we need to start 50,000 units weekly x12 weeks in addition to daily OTC supplement of at least 2000 units.

## 2022-02-19 NOTE — Telephone Encounter (Signed)
Pt has been made aware of lab results . Vit D 50,000 units sent to pharamcy

## 2022-03-11 ENCOUNTER — Other Ambulatory Visit: Payer: Self-pay | Admitting: Family Medicine

## 2022-03-11 ENCOUNTER — Other Ambulatory Visit: Payer: Self-pay

## 2022-03-24 ENCOUNTER — Other Ambulatory Visit: Payer: Self-pay | Admitting: Family Medicine

## 2022-03-25 ENCOUNTER — Other Ambulatory Visit: Payer: Self-pay | Admitting: Family Medicine

## 2022-03-26 ENCOUNTER — Other Ambulatory Visit: Payer: Self-pay

## 2022-03-26 MED ORDER — OMEPRAZOLE 40 MG PO CPDR: DELAYED_RELEASE_CAPSULE | ORAL | 0 refills | Status: DC

## 2022-03-26 NOTE — Telephone Encounter (Signed)
Xanax 1 mg LOV: 02/18/22 Last Refill:11/04/21 Upcoming appt: 06/02/22

## 2022-03-26 NOTE — Telephone Encounter (Signed)
Pt aware that Rx has been sent in  

## 2022-05-07 LAB — HM DIABETES EYE EXAM

## 2022-06-02 ENCOUNTER — Ambulatory Visit: Payer: BC Managed Care – PPO | Admitting: Family Medicine

## 2022-06-04 ENCOUNTER — Telehealth: Payer: Self-pay

## 2022-06-04 ENCOUNTER — Encounter: Payer: Self-pay | Admitting: Family Medicine

## 2022-06-04 ENCOUNTER — Ambulatory Visit: Payer: BC Managed Care – PPO | Admitting: Family Medicine

## 2022-06-04 VITALS — BP 130/80 | HR 92 | Temp 97.9°F | Resp 18 | Ht 63.0 in | Wt 207.5 lb

## 2022-06-04 DIAGNOSIS — E119 Type 2 diabetes mellitus without complications: Secondary | ICD-10-CM

## 2022-06-04 DIAGNOSIS — Z7985 Long-term (current) use of injectable non-insulin antidiabetic drugs: Secondary | ICD-10-CM

## 2022-06-04 DIAGNOSIS — Z7984 Long term (current) use of oral hypoglycemic drugs: Secondary | ICD-10-CM | POA: Diagnosis not present

## 2022-06-04 LAB — BASIC METABOLIC PANEL
BUN: 10 mg/dL (ref 6–23)
CO2: 26 mEq/L (ref 19–32)
Calcium: 9.4 mg/dL (ref 8.4–10.5)
Chloride: 106 mEq/L (ref 96–112)
Creatinine, Ser: 0.87 mg/dL (ref 0.40–1.20)
GFR: 70.77 mL/min (ref >60.00)
Glucose, Bld: 66 mg/dL — ABNORMAL LOW (ref 70–99)
Potassium: 4.1 mEq/L (ref 3.5–5.1)
Sodium: 142 mEq/L (ref 135–145)

## 2022-06-04 LAB — HEMOGLOBIN A1C: Hgb A1c MFr Bld: 5.4 % (ref 4.6–6.5)

## 2022-06-04 NOTE — Progress Notes (Signed)
   Subjective:    Patient ID: Ariana [name], female    DOB: 1960/07/22, 62 y.o.   MRN: [id number]  HPI DM- chronic problem.  Last A1C 5.4%  UTD on microalbumin, eye exam, foot exam.  On Jardiance 10mg  daily and Ozempic 0.5mg  weekly.  Pt reports feeling 'good'.  No CP, SOB, HA's, visual changes, abd pain, N/V.  Will have symptomatic lows if she doesn't eat.  No numbness/tingling of hands/feet.  Some days notes excessive fatigue and on those days, HR is typically in the 40-50s.  Cardiology aware.   Review of Systems For ROS see HPI     Objective:   Physical Exam Vitals reviewed.  Constitutional:      General: She is not in acute distress.    Appearance: Normal appearance. She is well-developed. She is obese. She is not ill-appearing.  HENT:     Head: Normocephalic and atraumatic.  Eyes:     Conjunctiva/sclera: Conjunctivae normal.     Pupils: Pupils are equal, round, and reactive to light.  Neck:     Thyroid: No thyromegaly.  Cardiovascular:     Rate and Rhythm: Normal rate and regular rhythm.     Heart sounds: Normal heart sounds. No murmur heard. Pulmonary:     Effort: Pulmonary effort is normal. No respiratory distress.     Breath sounds: Normal breath sounds.  Abdominal:     General: There is no distension.     Palpations: Abdomen is soft.     Tenderness: There is no abdominal tenderness.  Musculoskeletal:     Cervical back: Normal range of motion and neck supple.  Lymphadenopathy:     Cervical: No cervical adenopathy.  Skin:    General: Skin is warm and dry.  Neurological:     Mental Status: She is alert and oriented to person, place, and time.  Psychiatric:        Behavior: Behavior normal.           Assessment & Plan:

## 2022-06-04 NOTE — Telephone Encounter (Signed)
Pt seen results Via my chart  

## 2022-06-04 NOTE — Patient Instructions (Signed)
Follow up in mid-late September to recheck sugar and cholesterol We'll notify you of your lab results and make any changes if needed Continue to work on low carb/low sugar diet and regular physical activity Get a small calendar and keep track of the days you don't feel well and what your heart rate is on those days Call with any questions or concerns Stay Safe!   Stay Healthy! HAVE A GREAT SUMMER!!!

## 2022-06-04 NOTE — Assessment & Plan Note (Signed)
Chronic problem.  Currently on Jardiance 10mg  daily and Ozempic 0.5mg  weekly.  She states she feels well most days but does have days of excessive fatigue.  On those days, her HR tends to run in the 40-50s.  Encouraged her to keep a calendar of those days and document her HR.  May need to stop beta blocker but will defer to Cards.  Check A1C.  Adjust meds prn

## 2022-06-04 NOTE — Telephone Encounter (Signed)
-----   Message from Sheliah Hatch, MD sent at 06/04/2022 12:56 PM EDT ----- Labs look great!  No changes at this time

## 2022-06-20 ENCOUNTER — Other Ambulatory Visit: Payer: Self-pay | Admitting: Family Medicine

## 2022-06-30 ENCOUNTER — Other Ambulatory Visit: Payer: Self-pay | Admitting: Family Medicine

## 2022-07-13 ENCOUNTER — Other Ambulatory Visit: Payer: Self-pay | Admitting: Family Medicine

## 2022-07-15 ENCOUNTER — Other Ambulatory Visit: Payer: Self-pay | Admitting: Family Medicine

## 2022-07-20 ENCOUNTER — Other Ambulatory Visit: Payer: Self-pay | Admitting: Family Medicine

## 2022-08-20 ENCOUNTER — Encounter (INDEPENDENT_AMBULATORY_CARE_PROVIDER_SITE_OTHER): Payer: Self-pay

## 2022-08-25 ENCOUNTER — Other Ambulatory Visit: Payer: Self-pay | Admitting: Cardiovascular Disease

## 2022-08-29 ENCOUNTER — Other Ambulatory Visit: Payer: Self-pay | Admitting: Family Medicine

## 2022-08-31 NOTE — Telephone Encounter (Signed)
Xanax 1 mg Requested Prescriptions   Pending Prescriptions Disp Refills   ALPRAZolam (XANAX) 1 MG tablet [Pharmacy Med Name: ALPRAZOLAM 1 MG TABLET] 30 tablet     Sig: TAKE 1/2 TABLET BY MOUTH TWICE A DAY AS NEEDED FOR ANXIETY     Date of patient request: 08/31/22 Last office visit: 06/04/22 Date of last refill: 03/26/22 Last refill amount: 30 Follow up time period per chart: mid Sept

## 2022-09-17 ENCOUNTER — Other Ambulatory Visit: Payer: Self-pay | Admitting: Family Medicine

## 2022-09-28 ENCOUNTER — Telehealth: Payer: Self-pay

## 2022-09-28 ENCOUNTER — Other Ambulatory Visit (HOSPITAL_COMMUNITY): Payer: Self-pay

## 2022-09-28 NOTE — Telephone Encounter (Signed)
Pharmacy Patient Advocate Encounter   Received notification from CoverMyMeds that prior authorization for Ozempic 2mg /51ml is required/requested.   Insurance verification completed.   The patient is insured through [facility] .   Per test claim: PA required; PA submitted to [facility] via CoverMyMeds Key/confirmation #/EOC [id number] Status is pending

## 2022-09-29 NOTE — Telephone Encounter (Signed)
Pt has been notified. Pt reports she has been has appt tomorrow with Dr.Tabori and will discuss this then.

## 2022-09-29 NOTE — Telephone Encounter (Signed)
Pharmacy Patient Advocate Encounter  Received notification from [facility] that Prior Authorization for Ozempic (0.25 or 0.5 MG/DOSE) 2MG /3ML pen-injectors has been DENIED.  Full denial letter will be uploaded to the media tab. See denial reason below.  Your plan only covers this drug when A) your A1c is sent to [place], and your test results are in a certain range (A1C greater than or equal to 6.5 percent), B) your 2-hour plasma glucose (PG) during oral glucose tolerance test (OGTT) is sent to [place], and your results are in a certain range (2-hour PG greater than or equal to 200mg /dL), C) your random plasma glucose is sent to [place], and your tests results are in a certain range (random plasma glucose greater than or equal to 200mg /dL with symptoms of hyperglycemia (e.g. polyuria, polydipsia, polyphagia) or hyperglycemic crisis), D) your fasting plasma glucose (FPG) is sent to [place], and your results are in a certain range (FPG greater than or equal to 126 mg/dL). We denied your request because: A) We did not receive your results, or B) Your results were not in the approvable range. We reviewed the information we had. Your request has been denied.   PA #/Case ID/Reference #: [id number]  Please be advised we currently do not have a Pharmacist to review denials, therefore you will need to process appeals accordingly as needed. Thanks for your support at this time. Contact for appeals are as follows: Phone: [phone], Fax: [phone]

## 2022-09-29 NOTE — Telephone Encounter (Signed)
Noted.  Will discuss at time of visit.  However, the reason her A1C is at goal is b/c she's on Jardiance daily.  This should not be held against her when trying to qualify for Ozempic

## 2022-09-30 ENCOUNTER — Ambulatory Visit: Payer: BC Managed Care – PPO | Admitting: Family Medicine

## 2022-09-30 ENCOUNTER — Encounter: Payer: Self-pay | Admitting: Family Medicine

## 2022-09-30 VITALS — BP 126/76 | HR 42 | Temp 97.8°F | Wt 213.6 lb

## 2022-09-30 DIAGNOSIS — E7800 Pure hypercholesterolemia, unspecified: Secondary | ICD-10-CM | POA: Diagnosis not present

## 2022-09-30 DIAGNOSIS — E119 Type 2 diabetes mellitus without complications: Secondary | ICD-10-CM | POA: Diagnosis not present

## 2022-09-30 DIAGNOSIS — I5032 Chronic diastolic (congestive) heart failure: Secondary | ICD-10-CM | POA: Diagnosis not present

## 2022-09-30 DIAGNOSIS — Z7984 Long term (current) use of oral hypoglycemic drugs: Secondary | ICD-10-CM

## 2022-09-30 DIAGNOSIS — R001 Bradycardia, unspecified: Secondary | ICD-10-CM

## 2022-09-30 LAB — CBC WITH DIFFERENTIAL/PLATELET
Basophils Absolute: 0 10*3/uL (ref 0.0–0.1)
Basophils Relative: 0.2 % (ref 0.0–3.0)
Eosinophils Absolute: 0 10*3/uL (ref 0.0–0.7)
Eosinophils Relative: 0 % (ref 0.0–5.0)
HCT: 41 % (ref 36.0–46.0)
Hemoglobin: 12.5 g/dL (ref 12.0–15.0)
Lymphocytes Relative: 27.6 % (ref 12.0–46.0)
Lymphs Abs: 1.9 10*3/uL (ref 0.7–4.0)
MCHC: 30.6 g/dL (ref 30.0–36.0)
MCV: 79.1 fl (ref 78.0–100.0)
Monocytes Absolute: 0.6 10*3/uL (ref 0.1–1.0)
Monocytes Relative: 9 % (ref 3.0–12.0)
Neutro Abs: 4.4 10*3/uL (ref 1.4–7.7)
Neutrophils Relative %: 63.2 % (ref 43.0–77.0)
Platelets: 261 10*3/uL (ref 150.0–400.0)
RBC: 5.19 Mil/uL — ABNORMAL HIGH (ref 3.87–5.11)
RDW: 17.5 % — ABNORMAL HIGH (ref 11.5–15.5)
WBC: 6.9 10*3/uL (ref 4.0–10.5)

## 2022-09-30 LAB — LIPID PANEL
Cholesterol: 115 mg/dL (ref 0–200)
HDL: 33.6 mg/dL — ABNORMAL LOW (ref >39.00)
LDL Cholesterol: 46 mg/dL (ref 0–99)
NonHDL: 81.87
Total CHOL/HDL Ratio: 3
Triglycerides: 177 mg/dL — ABNORMAL HIGH (ref 0.0–149.0)
VLDL: 35.4 mg/dL (ref 0.0–40.0)

## 2022-09-30 LAB — HEPATIC FUNCTION PANEL
ALT: 21 U/L (ref 0–35)
AST: 23 U/L (ref 0–37)
Albumin: 4.1 g/dL (ref 3.5–5.2)
Alkaline Phosphatase: 76 U/L (ref 39–117)
Bilirubin, Direct: 0 mg/dL (ref 0.0–0.3)
Total Bilirubin: 0.4 mg/dL (ref 0.2–1.2)
Total Protein: 7.3 g/dL (ref 6.0–8.3)

## 2022-09-30 LAB — BASIC METABOLIC PANEL
BUN: 16 mg/dL (ref 6–23)
CO2: 26 mEq/L (ref 19–32)
Calcium: 9.8 mg/dL (ref 8.4–10.5)
Chloride: 104 mEq/L (ref 96–112)
Creatinine, Ser: 0.99 mg/dL (ref 0.40–1.20)
GFR: 60.47 mL/min (ref >60.00)
Glucose, Bld: 86 mg/dL (ref 70–99)
Potassium: 4.3 mEq/L (ref 3.5–5.1)
Sodium: 139 mEq/L (ref 135–145)

## 2022-09-30 LAB — TSH: TSH: 1.5 u[IU]/mL (ref 0.35–5.50)

## 2022-09-30 LAB — HEMOGLOBIN A1C: Hgb A1c MFr Bld: 5.4 % (ref 4.6–6.5)

## 2022-09-30 NOTE — Patient Instructions (Signed)
Follow up in 3 months to recheck sugars We'll notify you of your lab results and make any changes if needed I'm going to send today's note to Cardiology and see about next steps In the meantime, DECREASE the Bisoprolol to 1/2 tab If you feel short of breath, dizzy, or have other concerns- please go to the ER Call with any questions or concerns Hang in there!!!

## 2022-09-30 NOTE — Assessment & Plan Note (Signed)
Chronic problem.  Currently on Jardiance 10mg  daily w/o difficulty.  UTD on microalbumin, eye exam, foot exam.  Currently asymptomatic.  Check labs.  Adjust meds prn

## 2022-09-30 NOTE — Progress Notes (Signed)
Subjective:    Patient ID: Ariana [name], female    DOB: [birth year], 62 y.o.   MRN: [id number]  HPI DM- chronic problem, on Jardiance 10mg  daily.  UTD on microalbumin, eye exam, foot exam.  No HA's, visual changes.  Denies symptomatic lows.  No numbness/tingling of hands/feet  Chronic diastolic HF- follows w/ Cardiology.  Currently on Amlodipine 5mg  daily, Bisoprolol 5mg  daily, and Spironolactone 12.5mg  BID.  HR today 42.  Pt reports HR will episodically slow down.  Pt reports she doesn't feel well when HR is low.  No CP or SOB (as long as walk is less than 0.5 miles)  Hyperlipidemia- chronic problem, on Crestor 20mg  daily.  No abd pain, N/V.   Review of Systems For ROS see HPI     Objective:   Physical Exam Vitals reviewed.  Constitutional:      General: She is not in acute distress.    Appearance: Normal appearance. She is well-developed. She is obese. She is not ill-appearing.  HENT:     Head: Normocephalic and atraumatic.  Eyes:     Conjunctiva/sclera: Conjunctivae normal.     Pupils: Pupils are equal, round, and reactive to light.  Neck:     Thyroid: No thyromegaly.  Cardiovascular:     Rate and Rhythm: Regular rhythm. Bradycardia present.     Heart sounds: Normal heart sounds. No murmur heard. Pulmonary:     Effort: Pulmonary effort is normal. No respiratory distress.     Breath sounds: Normal breath sounds.  Abdominal:     General: There is no distension.     Palpations: Abdomen is soft.     Tenderness: There is no abdominal tenderness.  Musculoskeletal:     Cervical back: Normal range of motion and neck supple.  Lymphadenopathy:     Cervical: No cervical adenopathy.  Skin:    General: Skin is warm and dry.  Neurological:     General: No focal deficit present.     Mental Status: She is alert and oriented to person, place, and time.  Psychiatric:        Mood and Affect: Mood normal.        Behavior: Behavior normal.        Thought Content: Thought content  normal.           Assessment & Plan:   Symptomatic Bradycardia- new.  Pt reports feeling 'terrible'.  States she tried to schedule with cardiology but they told her she didn't ned to be seen until Feb.  Will decrease Bisoprolol to 1/2 tab daily.  Would like to stop it entirely but given her diastolic heart failure, will continue at this time.  Will forward to Cardiologist to make him aware of current situation.  Reviewed supportive care and red flags that should prompt return.  Pt expressed understanding and is in agreement w/ plan.

## 2022-09-30 NOTE — Assessment & Plan Note (Signed)
Chronic problem.  Currently on Crestor 20mg  daily w/o difficulty.  Check labs.  Adjust meds prn

## 2022-09-30 NOTE — Assessment & Plan Note (Signed)
Chronic problem.  Currently following w/ Cardiology.  At this time is having symptomatic bradycardia.  Will decrease Bisoprolol and forward note to cardiology.

## 2022-10-02 ENCOUNTER — Telehealth: Payer: Self-pay | Admitting: Family Medicine

## 2022-10-02 ENCOUNTER — Telehealth: Payer: Self-pay

## 2022-10-02 NOTE — Telephone Encounter (Signed)
Returned your call.

## 2022-10-02 NOTE — Telephone Encounter (Signed)
Patient returned my call and she did not have any questions about her labs. She did ask about her Ozempic.   Spoke with Dr. Beverely Low and she recommended the following: If insurance denied coverage, there's not much we can do b/c your A1C is FANTASTIC.  If insurance does cover it, you can use it for the weight loss benefits but you don't need it for your A1C.  I don't want you paying out of pocket b/c it's too expensive.  Continue the Jardiance for both the sugar and heart benefits.   She verbalized understanding and all (if any) questions were answered

## 2022-10-02 NOTE — Telephone Encounter (Signed)
Patient viewed results via MyChart. I called and left a voice message for patient asking if she may have any questions and/or concerns about the comments from Dr. Beverely Low.

## 2022-12-14 ENCOUNTER — Other Ambulatory Visit: Payer: Self-pay | Admitting: Family Medicine

## 2022-12-23 LAB — HM MAMMOGRAPHY

## 2022-12-31 ENCOUNTER — Other Ambulatory Visit: Payer: Self-pay | Admitting: Family Medicine

## 2023-01-01 ENCOUNTER — Ambulatory Visit: Payer: BC Managed Care – PPO | Admitting: Family Medicine

## 2023-01-01 NOTE — Telephone Encounter (Signed)
error 

## 2023-01-05 ENCOUNTER — Encounter: Payer: Self-pay | Admitting: Family Medicine

## 2023-01-05 ENCOUNTER — Ambulatory Visit: Payer: BC Managed Care – PPO | Admitting: Family Medicine

## 2023-01-05 ENCOUNTER — Telehealth: Payer: Self-pay

## 2023-01-05 ENCOUNTER — Other Ambulatory Visit (HOSPITAL_COMMUNITY): Payer: Self-pay

## 2023-01-05 VITALS — BP 112/62 | HR 61 | Temp 97.7°F | Ht 63.0 in | Wt 224.2 lb

## 2023-01-05 DIAGNOSIS — Z114 Encounter for screening for human immunodeficiency virus [HIV]: Secondary | ICD-10-CM

## 2023-01-05 DIAGNOSIS — Z7984 Long term (current) use of oral hypoglycemic drugs: Secondary | ICD-10-CM

## 2023-01-05 DIAGNOSIS — Z7985 Long-term (current) use of injectable non-insulin antidiabetic drugs: Secondary | ICD-10-CM

## 2023-01-05 DIAGNOSIS — E119 Type 2 diabetes mellitus without complications: Secondary | ICD-10-CM

## 2023-01-05 DIAGNOSIS — Z1159 Encounter for screening for other viral diseases: Secondary | ICD-10-CM | POA: Diagnosis not present

## 2023-01-05 LAB — HEMOGLOBIN A1C: Hgb A1c MFr Bld: 5.7 % (ref 4.6–6.5)

## 2023-01-05 LAB — BASIC METABOLIC PANEL
BUN: 31 mg/dL — ABNORMAL HIGH (ref 6–23)
CO2: 25 meq/L (ref 19–32)
Calcium: 9.7 mg/dL (ref 8.4–10.5)
Chloride: 106 meq/L (ref 96–112)
Creatinine, Ser: 0.91 mg/dL (ref 0.40–1.20)
GFR: 66.78 mL/min (ref >60.00)
Glucose, Bld: 83 mg/dL (ref 70–99)
Potassium: 4.1 meq/L (ref 3.5–5.1)
Sodium: 141 meq/L (ref 135–145)

## 2023-01-05 MED ORDER — TIRZEPATIDE 2.5 MG/0.5ML ~~LOC~~ SOAJ: 2.5000 mg | SUBCUTANEOUS | 1 refills | Status: DC

## 2023-01-05 NOTE — Assessment & Plan Note (Signed)
 Deteriorated.  Pt has gained 10 lbs since last visit.  She is very frustrated b/c she is eating <1500 calories daily and walking regularly.  Suspect this weight gain occurred when she stopped Ozempic .  Her insurance changes tomorrow and we will try and start Mounjaro .  Pt expressed understanding and is in agreement w/ plan.

## 2023-01-05 NOTE — Patient Instructions (Addendum)
 Schedule your complete physical in 3-4 months We'll notify you of your lab results and make any changes if needed Continue to work on healthy diet and regular exercise- you're doing great! We'll attempt a prior authorization on the Mounjaro  and get this started for the new year Call with any questions or concerns Stay Safe!  Stay Healthy! Happy New Year!!

## 2023-01-05 NOTE — Telephone Encounter (Signed)
 Pharmacy Patient Advocate Encounter   Received notification from CoverMyMeds that prior authorization for MOUNJARO  2.5MG  is required/requested.   Insurance verification completed.   The patient is insured through [facility] .   Per test claim: PA required; PA submitted to above mentioned insurance via CoverMyMeds Key/confirmation #/EOC [id number] Status is pending

## 2023-01-05 NOTE — Telephone Encounter (Signed)
 Pharmacy Patient Advocate Encounter  Received notification from [facility] that Prior Authorization for MOUNJARO  2.5MG  has been DENIED.  Full denial letter will be uploaded to the media tab. See denial reason below.   PA #/Case ID/Reference #: [id number]

## 2023-01-05 NOTE — Assessment & Plan Note (Signed)
 Deteriorated.  Pt has gained 10 lbs since last visit (and stopping Ozempic ).  On Jardiance  10mg  daily w/o difficulty.  Pt has hx of good sugar control- last A1C was excellent- but is struggling w/ weight gain despite eating <1500 calories and walking regularly.  Will attempt to start Mounjaro  for both sugar control and weight loss benefits.  Pt expressed understanding and is in agreement w/ plan.

## 2023-01-05 NOTE — Progress Notes (Signed)
   Subjective:    Patient ID: Ariana [name], female    DOB: 10/16/1960, 62 y.o.   MRN: [id number]  HPI DM- chronic problem.  Currently on Jardiance  10mg  daily.  Previously on Ozempic  weekly but insurance stopped coverage and she has been off medication ~3 months.  UTD on eye exam, foot exam, microalbumin.  Pt has gained 10 lbs since stopping Ozempic - despite eating less than 1500 calories.  Is walking regularly.  Pt reports that since gaining the weight, she has had return of ankle swelling.     Review of Systems For ROS see HPI     Objective:   Physical Exam Vitals reviewed.  Constitutional:      General: She is not in acute distress.    Appearance: Normal appearance. She is well-developed. She is obese. She is not ill-appearing.  HENT:     Head: Normocephalic and atraumatic.  Eyes:     Conjunctiva/sclera: Conjunctivae normal.     Pupils: Pupils are equal, round, and reactive to light.  Neck:     Thyroid : No thyromegaly.  Cardiovascular:     Rate and Rhythm: Normal rate and regular rhythm.     Pulses: Normal pulses.     Heart sounds: Normal heart sounds. No murmur heard. Pulmonary:     Effort: Pulmonary effort is normal. No respiratory distress.     Breath sounds: Normal breath sounds.  Abdominal:     General: There is no distension.     Palpations: Abdomen is soft.     Tenderness: There is no abdominal tenderness.  Musculoskeletal:     Cervical back: Normal range of motion and neck supple.     Right lower leg: No edema.     Left lower leg: No edema.  Lymphadenopathy:     Cervical: No cervical adenopathy.  Skin:    General: Skin is warm and dry.  Neurological:     General: No focal deficit present.     Mental Status: She is alert and oriented to person, place, and time.  Psychiatric:        Mood and Affect: Mood normal.        Behavior: Behavior normal.        Thought Content: Thought content normal.           Assessment & Plan:

## 2023-01-06 LAB — HIV ANTIBODY (ROUTINE TESTING W REFLEX): HIV 1&2 Ab, 4th Generation: NONREACTIVE

## 2023-01-06 LAB — HEPATITIS C ANTIBODY: Hepatitis C Ab: NONREACTIVE

## 2023-01-07 ENCOUNTER — Telehealth: Payer: Self-pay

## 2023-01-07 NOTE — Telephone Encounter (Signed)
-----   Message from Neena Rhymes sent at 01/07/2023  7:53 AM EST ----- Labs look great!  No changes at this time

## 2023-01-07 NOTE — Telephone Encounter (Signed)
 Pt has reviewed via MyChart

## 2023-01-12 ENCOUNTER — Encounter: Payer: Self-pay | Admitting: Family Medicine

## 2023-01-13 NOTE — Telephone Encounter (Signed)
 Patient Ariana [name] was denied please advise

## 2023-01-14 NOTE — Telephone Encounter (Signed)
 PA request has been Denied. For additional info see Pharmacy Prior Auth telephone encounter from 01/05/23.

## 2023-01-20 ENCOUNTER — Other Ambulatory Visit: Payer: Self-pay | Admitting: Family Medicine

## 2023-01-21 ENCOUNTER — Other Ambulatory Visit: Payer: Self-pay | Admitting: Family Medicine

## 2023-01-21 NOTE — Telephone Encounter (Signed)
Requested Prescriptions   Pending Prescriptions Disp Refills   ALPRAZolam (XANAX) 1 MG tablet [Pharmacy Med Name: ALPRAZOLAM 1 MG TABLET] 30 tablet 3    Sig: TAKE 1/2 TABLET BY MOUTH TWICE A DAY AS NEEDED FOR ANXIETY     Date of patient request: 01/21/2023 Last office visit: 01/05/2023 Upcoming visit: 01/21/2023 Date of last refill: 08/31/2022 Last refill amount: 30

## 2023-02-09 ENCOUNTER — Other Ambulatory Visit (HOSPITAL_COMMUNITY): Payer: Self-pay

## 2023-02-12 ENCOUNTER — Ambulatory Visit: Payer: 59 | Attending: Cardiovascular Disease | Admitting: Cardiovascular Disease

## 2023-02-12 ENCOUNTER — Encounter: Payer: Self-pay | Admitting: Cardiovascular Disease

## 2023-02-12 VITALS — BP 122/60 | HR 42 | Ht 63.0 in | Wt 230.4 lb

## 2023-02-12 DIAGNOSIS — Z136 Encounter for screening for cardiovascular disorders: Secondary | ICD-10-CM | POA: Diagnosis not present

## 2023-02-12 DIAGNOSIS — M25471 Effusion, right ankle: Secondary | ICD-10-CM

## 2023-02-12 DIAGNOSIS — I2510 Atherosclerotic heart disease of native coronary artery without angina pectoris: Secondary | ICD-10-CM

## 2023-02-12 DIAGNOSIS — R079 Chest pain, unspecified: Secondary | ICD-10-CM

## 2023-02-12 DIAGNOSIS — R001 Bradycardia, unspecified: Secondary | ICD-10-CM

## 2023-02-12 DIAGNOSIS — R0609 Other forms of dyspnea: Secondary | ICD-10-CM

## 2023-02-12 DIAGNOSIS — E785 Hyperlipidemia, unspecified: Secondary | ICD-10-CM

## 2023-02-12 MED ORDER — BISOPROLOL FUMARATE 5 MG PO TABS: 2.5000 mg | ORAL_TABLET | Freq: Every day | ORAL | 3 refills | Status: DC

## 2023-02-12 NOTE — Progress Notes (Signed)
 Cardiology Office Note    Date:  02/14/2023   ID:  [name], Ariana 1960/01/08, MRN [id number]  PCP:  Mahlon Comer BRAVO, MD  Cardiologist:  Debby Sor, MD   65-month follow-up evaluation.  History of Present Illness:  Ariana [name] is a 63 y.o. female who was initially referred through the courtesy of Dr. Mahlon for evaluation of exertional shortness of breath and recent documentation of grade 2 diastolic dysfunction.  I initially saw her in October 2019.  I last saw her in January 2024.   Ms. Ariana [name] denies any known cardiac history.  She has a history of obesity, hypertension, obstructive sleep apnea, GERD, and recently has started to notice a change in symptoms with development of exertional shortness of breath.  She had smoked during her late teenage years for approximately 3 to 4 years but quit in 1982.  She was evaluated from a pulmonary standpoint by Drs. Jude and Almarie Hope PIETY.  Pulmonary function studies did not reveal evidence for obstruction.  She had mild small airway disease.  An echo Doppler study revealed an ejection fraction of 60 to 65% with normal wall motion and showed grade 2 diastolic dysfunction.  A chest x-ray did not reveal acute cardiopulmonary disease.  A d-dimer was negative.  BNP was 41.  Upon further questioning she states that recently she also has noticed some mild exertional chest tightness since April.  Oftentimes she develops this when she is out of breath.  If she were to walk fast she will have to stop to catch her breath.  She lives by herself.  Earlier this year she had laboratory which had shown a total cholesterol 171 LDL cholesterol 97 with triglycerides at 101.  At that time her potassium was low at 3.2.  She has recently been on amlodipine  5 mg and hydrochlorothiazide  25 mg in addition to supplemental potassium 20 mEq.  She has been on simvastatin  40 mg for hyperlipidemia.  She has been taking Lasix  as needed.  She does have a history of  panic attacks and anxiety and depression for which she is on Xanax  as well as Wellbutrin  XL and Prozac .    During her initial evaluation with me in October 2019 and with her grade 2 diastolic dysfunction I recommended discontinuance of hydrochlorothiazide  and supplemental pad potassium and initiate spironolactone  therapy.  With her father dying of an MI at age 43 and symptoms of exertional dyspnea with chest fullness I recommended CT coronary angio for further evaluation.  Her LDL cholesterol was 97 I recommend more aggressive lipid therapy with discontinuance of simvastatin  and changing to rosuvastatin .  She was seen by Scot Ford, PA in December 2019 for follow-up evaluation.  Her coronary CTA showed a minimal calcium  score of 3.89.  She was not found to have evidence for obstructive CAD with very minimal plaque in the proximal LAD.  I saw her on January 23, 2019. She has been seen by Dr. Alva and was started on inhaler therapy for asthma.  She also was started on Jardiance  for diabetes mellitus and had a hemoglobin A1c of 7.3.  She has noticed some trace ankle edema.  At times she has noticed an occasional palpitation.  She is now on Symbicort  and last week she was recently started on furosemide  20 mg.  She is on spironolactone  12.5 mg twice a day in addition to Jardiance  10 mg daily and amlodipine  5 mg.  She is now on rosuvastatin  20 mg for hyperlipidemia.  Ms. Ariana [name] is status post bilateral knee replacements with her initial right knee replacement on August 25, 2021.  She was evaluated by Dr. Dorn Lesches on December 19, 2021 for preoperative evaluation prior to undergoing elective left total knee replacement surgery on December 22, 2021.  I last saw her on January 28, 2022 for 3-year follow-up evaluation.  Ms. Ariana [name] denies any chest pain but admits to lower extremity edema.  She has lost approximately 60 pounds of weight with a peak weight of 267 down most recently 206.  She is followed by Dr.  Mahlon for primary care.  She is on amlodipine  5 mg, furosemide  20 mg as needed, bisoprolol  5 mg daily, spironolactone  12.5 mg twice a day and takes Jardiance  10 mg daily.  She is also on Ozempic  0.5 mg weekly injection.  She denies any chest pain, PND orthopnea, presyncope or syncope.    Since I last saw her, she has been trying to walk every day.  At times, she admits to some mild chest sensation particularly when she is walking or particularly climbing up steps.  This typically resolves spontaneously.  She has had insurance issues with denial of her Ozempic  which has resulted in weight gain of at least 16 pounds.  She has been trying to fight with the insurance company so that she can continue with Ozempic .  She denies any chest pain or shortness of breath.  Her heart rate tends to be slow.  She currently is on amlodipine  5 mg, bisoprolol  5 mg, spironolactone  12.5 mg twice a day and has a prescription for as needed furosemide .  She continues to be on Jardiance  10 mg for diabetes mellitus.  She was recently given a prescription to try Mounjaro  but she has not yet started this awaiting insurance approval.  She is on rosuvastatin  20 mg daily for hyperlipidemia.  She presents for evaluation.  Past Medical History:  Diagnosis Date   Anxiety    Coronary artery disease    on CT   Depression    Diabetes mellitus without complication (HCC)    Dyspnea    pt denies at preop on 12/09/21   GERD (gastroesophageal reflux disease)    Hyperlipidemia    Hypertension    OA (osteoarthritis)    in Bil knees/ gets steroid injections for pain   Ovarian cyst    right ovary/had ablation    Past Surgical History:  Procedure Laterality Date   COLONOSCOPY     ENDOMETRIAL ABLATION  2012   OVARIAN CYST REMOVAL Right 2012   TOTAL KNEE ARTHROPLASTY Right 08/25/2021   Procedure: TOTAL KNEE ARTHROPLASTY;  Surgeon: Edna Toribio LABOR, MD;  Location: [facility];  Service: Orthopedics;  Laterality: Right;   TOTAL KNEE  ARTHROPLASTY Left 12/22/2021   Procedure: TOTAL KNEE ARTHROPLASTY;  Surgeon: Edna Toribio LABOR, MD;  Location: [facility];  Service: Orthopedics;  Laterality: Left;   WISDOM TOOTH EXTRACTION      Current Medications: Outpatient Medications Prior to Visit  Medication Sig Dispense Refill   acetaminophen  (TYLENOL ) 500 MG tablet Take 1,000 mg by mouth every 8 (eight) hours as needed for moderate pain.     ALPRAZolam  (XANAX ) 1 MG tablet TAKE 1/2 TABLET BY MOUTH TWICE A DAY AS NEEDED FOR ANXIETY 30 tablet 3   amLODipine  (NORVASC ) 5 MG tablet TAKE 1 TABLET BY MOUTH EVERY DAY 90 tablet 1   buPROPion  (WELLBUTRIN  XL) 300 MG 24 hr tablet TAKE 1 TABLET BY MOUTH EVERY DAY 90 tablet 1  furosemide  (LASIX ) 20 MG tablet TAKE 1 TABLET BY MOUTH EVERY DAY AS NEEDED 90 tablet 1   JARDIANCE  10 MG TABS tablet TAKE 1 TABLET BY MOUTH EVERY DAY 30 tablet 6   nystatin-triamcinolone ointment (MYCOLOG) Apply 1 Application topically 2 (two) times daily as needed (yeast infections).     omeprazole  (PRILOSEC) 40 MG capsule TAKE 1 CAPSULE BY MOUTH EVERY DAY 90 capsule 0   ondansetron  (ZOFRAN ) 4 MG tablet TAKE 1 TABLET BY MOUTH EVERY 8 (EIGHT) HOURS AS NEEDED FOR UP TO 14 DAYS FOR NAUSEA OR VOMITING.     rosuvastatin  (CRESTOR ) 20 MG tablet TAKE 1 TABLET BY MOUTH EVERY DAY 90 tablet 3   spironolactone  (ALDACTONE ) 25 MG tablet TAKE 1/2 TABLET BY MOUTH TWICE A DAY 90 tablet 3   tirzepatide  (MOUNJARO ) 2.5 MG/0.5ML Pen Inject 2.5 mg into the skin once a week. 2 mL 1   Vitamin D , Ergocalciferol , (DRISDOL ) 1.25 MG (50000 UNIT) CAPS capsule Take 1 capsule (50,000 Units total) by mouth every 7 (seven) days. 7 capsule 12   bisoprolol  (ZEBETA ) 5 MG tablet TAKE 1 TABLET (5 MG TOTAL) BY MOUTH DAILY. 90 tablet 1   No facility-administered medications prior to visit.     Allergies:   Penicillins   Social History   Socioeconomic History   Marital status: Divorced    Spouse name: Not on file   Number of children: Not on file   Years  of education: Not on file   Highest education level: Associate degree: occupational, scientist, product/process development, or vocational program  Occupational History   Not on file  Tobacco Use   Smoking status: Former    Current packs/day: 0.00    Types: Cigarettes    Quit date: 01/18/1981    Years since quitting: 42.1   Smokeless tobacco: Never   Tobacco comments:    pt states she smoker for 3 years in high school (10/28/17)  Vaping Use   Vaping status: Never Used  Substance and Sexual Activity   Alcohol  use: No   Drug use: No   Sexual activity: Never  Other Topics Concern   Not on file  Social History Narrative   Not on file   Social Drivers of Health   Financial Resource Strain: Low Risk  (01/04/2023)   Overall Financial Resource Strain (CARDIA)    Difficulty of Paying Living Expenses: Not hard at all  Food Insecurity: No Food Insecurity (01/04/2023)   Hunger Vital Sign    Worried About Running Out of Food in the Last Year: Never true    Ran Out of Food in the Last Year: Never true  Transportation Needs: No Transportation Needs (01/04/2023)   PRAPARE - Administrator, Civil Service (Medical): No    Lack of Transportation (Non-Medical): No  Physical Activity: Insufficiently Active (01/04/2023)   Exercise Vital Sign    Days of Exercise per Week: 2 days    Minutes of Exercise per Session: 20 min  Stress: Stress Concern Present (01/04/2023)   [employer] of Occupational Health - Occupational Stress Questionnaire    Feeling of Stress : To some extent  Social Connections: Moderately Isolated (01/04/2023)   Social Connection and Isolation Panel [NHANES]    Frequency of Communication with Friends and Family: Once a week    Frequency of Social Gatherings with Friends and Family: Twice a week    Attends Religious Services: More than 4 times per year    Active Member of Clubs or Organizations: No  Attends Banker Meetings: Not on file    Marital Status: Divorced     She is separated and single.  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  There is no alcohol  use.  She does not routinely exercise.  Family History:  The patient's family history includes Arthritis in her brother and father; Cancer in her maternal aunt; Dementia in her mother; Diabetes in her brother; Heart attack in her father; Heart disease in her brother and father; Hyperlipidemia in her father; Hypertension in her father; Liver disease in her brother.   Her mother died at age 85 with dementia and had thyroid  problems.  Her father died with a heart attack at age 50.  She has been depressed since her son age 105 died in a motor vehicle accident.  ROS General: Negative; No fevers, chills, or night sweats;  HEENT: Negative; No changes in vision or hearing, sinus congestion, difficulty swallowing Pulmonary: Negative; No cough, wheezing, shortness of breath, hemoptysis Cardiovascular: See HPI GI: Negative; No nausea, vomiting, diarrhea, or abdominal pain GU: Negative; No dysuria, hematuria, or difficulty voiding; status post right ovary and cyst removed in 2009 Musculoskeletal: Negative; no myalgias, joint pain, or weakness Hematologic/Oncology: Negative; no easy bruising, bleeding Endocrine: Negative; no heat/cold intolerance; no diabetes Neuro: Negative; no changes in balance, headaches Skin: Negative; No rashes or skin lesions Psychiatric: Positive for anxiety and depression Sleep: Negative; No snoring, daytime sleepiness, hypersomnolence, bruxism, restless legs, hypnogognic hallucinations, no cataplexy Other comprehensive 14 point system review is negative.   PHYSICAL EXAM:   VS:  BP 122/60 (BP Location: Left Arm, Patient Position: Sitting, Cuff Size: Large)   Pulse (!) 42   Ht 5' 3 (1.6 m)   Wt 230 lb 6.4 oz (104.5 kg)   SpO2 95%   BMI 40.81 kg/m     Repeat blood pressure by me 120/60  Wt Readings from Last 3 Encounters:  02/12/23 230 lb  6.4 oz (104.5 kg)  01/05/23 224 lb 4 oz (101.7 kg)  09/30/22 213 lb 9.6 oz (96.9 kg)    When seen by me in January 2021 her weight was 267 pounds; and when last seen in January 2024 weight was 206 pounds.  General: Alert, oriented, no distress.  Skin: normal turgor, no rashes, warm and dry HEENT: Normocephalic, atraumatic. Pupils equal round and reactive to light; sclera anicteric; extraocular muscles intact;  Nose without nasal septal hypertrophy Mouth/Parynx benign; Mallinpatti scale 3 Neck: No JVD, no carotid bruits; normal carotid upstroke Lungs: clear to ausculatation and percussion; no wheezing or rales Chest wall: without tenderness to palpitation Heart: PMI not displaced, RRR, s1 s2 normal, 1/6 systolic murmur, no diastolic murmur, no rubs, gallops, thrills, or heaves Abdomen: soft, nontender; no hepatosplenomehaly, BS+; abdominal aorta nontender and not dilated by palpation. Back: no CVA tenderness Pulses 2+ Musculoskeletal: full range of motion, normal strength, no joint deformities Extremities: no clubbing cyanosis or edema, Homan's sign negative  Neurologic: grossly nonfocal; Cranial nerves grossly wnl Psychologic: Normal mood and affect     Studies/Labs Reviewed:   EKG Interpretation Date/Time:  Friday February 12 2023 11:34:25 EST Ventricular Rate:  42 PR Interval:  142 QRS Duration:  88 QT Interval:  488 QTC Calculation: 407 R Axis:   30  Text Interpretation: Marked sinus bradycardia Nonspecific T wave abnormality When compared with ECG of 09-Dec-2021 11:32, Sinus rhythm has replaced Junctional rhythm Nonspecific T wave abnormality now evident in Anterior leads Confirmed by Burnard Ned (  [id number]) on 02/14/2023 11:01:10 AM    January 28, 2021 ECG (independently read by me): NSR at 73, No ectopy.  January 23, 2019 ECG (independently read by me): Normal sinus rhythm at 95 bpm.  No ectopy.  Normal intervals.  October 2019 ECG (independently read by me): Normal sinus  rhythm with isolated PVC.  Nonspecific ST-T changes.  PR interval 140 ms QTc interval 454 ms.  Recent Labs:    Latest Ref Rng & Units 01/05/2023    9:52 AM 09/30/2022    9:57 AM 06/04/2022   10:05 AM  BMP  Glucose 70 - 99 mg/dL 83  86  66   BUN 6 - 23 mg/dL 31  16  10    Creatinine 0.40 - 1.20 mg/dL 9.08  9.00  9.12   Sodium 135 - 145 mEq/L 141  139  142   Potassium 3.5 - 5.1 mEq/L 4.1  4.3  4.1   Chloride 96 - 112 mEq/L 106  104  106   CO2 19 - 32 mEq/L 25  26  26    Calcium  8.4 - 10.5 mg/dL 9.7  9.8  9.4         Latest Ref Rng & Units 09/30/2022    9:57 AM 02/18/2022    2:07 PM 06/24/2021   11:13 AM  Hepatic Function  Total Protein 6.0 - 8.3 g/dL 7.3  7.2  7.8   Albumin 3.5 - 5.2 g/dL 4.1  4.0  4.3   AST 0 - 37 U/L 23  16  22    ALT 0 - 35 U/L 21  13  20    Alk Phosphatase 39 - 117 U/L 76  85  77   Total Bilirubin 0.2 - 1.2 mg/dL 0.4  0.4  0.4   Bilirubin, Direct 0.0 - 0.3 mg/dL 0.0  0.1  0.1        Latest Ref Rng & Units 09/30/2022    9:57 AM 02/18/2022    2:07 PM 12/09/2021   11:23 AM  CBC  WBC 4.0 - 10.5 K/uL 6.9  7.6  9.5   Hemoglobin 12.0 - 15.0 g/dL 87.4  87.8  87.0   Hematocrit 36.0 - 46.0 % 41.0  37.7  42.8   Platelets 150.0 - 400.0 K/uL 261.0  228.0  345    Lab Results  Component Value Date   MCV 79.1 09/30/2022   MCV 77.3 (L) 02/18/2022   MCV 83.4 12/09/2021   Lab Results  Component Value Date   TSH 1.50 09/30/2022   Lab Results  Component Value Date   HGBA1C 5.7 01/05/2023     BNP No results found for: BNP  ProBNP    Component Value Date/Time   PROBNP 41.0 07/30/2017 1553     Lipid Panel     Component Value Date/Time   CHOL 115 09/30/2022 0957   TRIG 177.0 (H) 09/30/2022 0957   HDL 33.60 (L) 09/30/2022 0957   CHOLHDL 3 09/30/2022 0957   VLDL 35.4 09/30/2022 0957   LDLCALC 46 09/30/2022 0957   LDLDIRECT 76.0 01/30/2020 1348     RADIOLOGY: No results found.   Additional studies/ records that were reviewed today include:  I  reviewed the office records of Dr. Comer Greet, the pulmonology evaluations, recent laboratory, echo Doppler assessment and PFTs. Coronary CTA was reviewed.  ASSESSMENT:    1. Chest pain of uncertain etiology   2. Screening for heart disease   3. Minimal Coronary artery calcification: 2019   4. DOE (dyspnea on  exertion)   5. Hyperlipidemia with target LDL less than 70   6. Sinus bradycardia   7. Morbid obesity (HCC)   8. Right ankle swelling     PLAN:  Ms. Willia Genrich is a 62 year old female who has a history of morbid obesity, hypertension, anxiety/depression, and when I initially saw her in October 2019 had noticed a definite change with development of increasing exertional dyspnea and some exertional chest fullness and pressure.  She was not very active due to arthritis in her knees.  She was found to have grade 2 diastolic dysfunction on her echocardiography and had onset of EF at 60 to 65%.  She was taken off hydrochlorothiazide  and has been on spironolactone  currently at 12.5 mg twice a day.  When last seen by me in January 2024 she had recently been started on as needed furosemide  as well as Symbicort  due to some mild asthma issues.  At her last evaluation in January 2024 she had lost approximately 60 pounds with initiation of Ozempic  therapy.  Over the past year she has had issues with insurance with continuation of treatment since her hemoglobin A1c has decreased significantly and on January 05, 2023 was 5.7.  She admits to gaining approximately 16 pounds of weight being off Ozempic  and apparently was just given a prescription to try Mounjaro  but this has not yet been approved by insurance.  Her blood pressure today is stable and on repeat by me was 120/60.  She states that she is trying to walk every day.  At times particularly if she is walking up a hill or climbing steps she does experience a vague sensation of 1 out of 10 mild chest tightness which resolves spontaneously.  Her  heart rate today is 42.  I have recommended she decrease bisoprolol  from 5 mg daily down to 2.5 mg and she will continue her current dose of amlodipine  5 mg daily.  With her episode of mild chest discomfort I have recommended she undergo coronary CTA for reassessment 5 years after her prior study which was done in 2019.  She does have family history for premature CAD with father dying of an MI at age 65.  At times she admits to trace ankle edema when she has a prescription for as needed furosemide .  I will see her in 3 months in follow-up of the above studies and further recommendations will be made at that time.  Medication Adjustments/Labs and Tests Ordered: Current medicines are reviewed at length with the patient today.  Concerns regarding medicines are outlined above.  Medication changes, Labs and Tests ordered today are listed in the Patient Instructions below. Patient Instructions  Medication Instructions:  DECREASE THE BISOPROLOL  FROM 5MG  TO 2.5MG  DAILY (CUT THE PILL IN HALF) *If you need a refill on your cardiac medications before your next appointment, please call your pharmacy*   Lab Work: No labs were ordered during today's visit.  If you have labs (blood work) drawn today and your tests are completely normal, you will receive your results only by: MyChart Message (if you have MyChart) OR A paper copy in the mail If you have any lab test that is abnormal or we need to change your treatment, we will call you to review the results.   Testing/Procedures:   Your cardiac CT will be scheduled at one of the below locations:   [facility] [address] [place], [place] [address] [phone]   If scheduled at [facility], please arrive  at the [facility] and Children's Entrance (Entrance C2) of [facility] 30 minutes prior to test start time. You can use the FREE valet parking offered at entrance C (encouraged to control the heart rate for the test)   Proceed to the [facility] Radiology Department (first floor) to check-in and test prep.  All radiology patients and guests should use entrance C2 at [facility], accessed from [facility], even though the hospital's physical address listed is [address].    If scheduled at [facility] or [facility], please arrive 15 mins early for check-in and test prep.  There is spacious parking and easy access to the radiology department from the [facility] Vascular entrance. Please enter here and check-in with the desk attendant.   If scheduled at [facility], please arrive 30 minutes early for check-in and test prep.  Please follow these instructions carefully (unless otherwise directed):  An IV will be required for this test and Nitroglycerin  will be given.  Hold all erectile dysfunction medications at least 3 days (72 hrs) prior to test. (Ie viagra, cialis, sildenafil, tadalafil, etc)   On the Night Before the Test: Be sure to Drink plenty of water . Do not consume any caffeinated/decaffeinated beverages or chocolate 12 hours prior to your test. Do not take any antihistamines 12 hours prior to your test. If the patient has contrast allergy: Patient will need a prescription for Prednisone  and very clear instructions (as follows): Prednisone  50 mg - take 13 hours prior to test Take another Prednisone  50 mg 7 hours prior to test Take another Prednisone  50 mg 1 hour prior to test Take Benadryl 50 mg 1 hour prior to test Patient must complete all four doses of above prophylactic medications. Patient will need a ride after test due to Benadryl.  On the Day of the Test: Drink plenty of water  until 1 hour prior to the test. Do not eat any food 1 hour prior to test. You may take your regular medications prior to the test.  Take metoprolol  (Lopressor ) two hours prior to test. If you take  Furosemide /Hydrochlorothiazide /Spironolactone /Chlorthalidone, please HOLD on the morning of the test. Patients who wear a continuous glucose monitor MUST remove the device prior to scanning. FEMALES- please wear underwire-free bra if available, avoid dresses & tight clothing      After the Test: Drink plenty of water . After receiving IV contrast, you may experience a mild flushed feeling. This is normal. On occasion, you may experience a mild rash up to 24 hours after the test. This is not dangerous. If this occurs, you can take Benadryl 25 mg, Zyrtec, Claritin, or Allegra and increase your fluid intake. (Patients taking Tikosyn should avoid Benadryl, and may take Zyrtec, Claritin, or Allegra) If you experience trouble breathing, this can be serious. If it is severe call 911 IMMEDIATELY. If it is mild, please call our office.  We will call to schedule your test 2-4 weeks out understanding that some insurance companies will need an authorization prior to the service being performed.   For more information and frequently asked questions, please visit our website : [email or web address]  For non-scheduling related questions, please contact the cardiac imaging nurse navigator should you have any questions/concerns: Cardiac Imaging Nurse Navigators Direct Office Dial: [phone]   For scheduling needs, including cancellations and rescheduling, please call Brittany, [phone].    Follow-Up: At [facility], you and your health needs are our priority.  As part of our continuing mission to provide you with exceptional heart care, we have created designated Provider Care Teams.  These Care Teams include your primary Cardiologist (physician) and Advanced Practice Providers (APPs -  Physician Assistants and Nurse Practitioners) who all work together to provide you with the care you need, when you need it.  We recommend signing up for the patient portal called MyChart.  Sign  up information is provided on this After Visit Summary.  MyChart is used to connect with patients for Virtual Visits (Telemedicine).  Patients are able to view lab/test results, encounter notes, upcoming appointments, etc.  Non-urgent messages can be sent to your provider as well.   To learn more about what you can do with MyChart, go to [email or web address].    Your next appointment:   3 month(s)  Provider:   Debby Sor, MD     Other Instructions Thank you for choosing [facility]!       Signed, Debby Sor, MD  02/14/2023 11:09 AM    [facility] [address], [address], [place], [place]  [address] Phone: [phone]

## 2023-02-12 NOTE — Patient Instructions (Signed)
 Medication Instructions:  DECREASE THE BISOPROLOL  FROM 5MG  TO 2.5MG  DAILY (CUT THE PILL IN HALF) *If you need a refill on your cardiac medications before your next appointment, please call your pharmacy*   Lab Work: No labs were ordered during today's visit.  If you have labs (blood work) drawn today and your tests are completely normal, you will receive your results only by: MyChart Message (if you have MyChart) OR A paper copy in the mail If you have any lab test that is abnormal or we need to change your treatment, we will call you to review the results.   Testing/Procedures:   Your cardiac CT will be scheduled at one of the below locations:   [facility] [address] [place], [place] [address] [phone]   If scheduled at [facility], please arrive at the [facility] and Children's Entrance (Entrance C2) of [facility] 30 minutes prior to test start time. You can use the FREE valet parking offered at entrance C (encouraged to control the heart rate for the test)  Proceed to the [facility] Radiology Department (first floor) to check-in and test prep.  All radiology patients and guests should use entrance C2 at [facility], accessed from [facility], even though the hospital's physical address listed is [address]    If scheduled at [facility] or [facility], please arrive 15 mins early for check-in and test prep.  There is spacious parking and easy access to the radiology department from the [facility] entrance. Please enter here and check-in with the desk attendant.   If scheduled at [facility], please arrive 30 minutes early for check-in and test prep.  Please follow these instructions carefully (unless otherwise directed):  An IV will be required for this test and Nitroglycerin  will be given.  Hold all erectile dysfunction medications at  least 3 days (72 hrs) prior to test. (Ie viagra, cialis, sildenafil, tadalafil, etc)   On the Night Before the Test: Be sure to Drink plenty of water . Do not consume any caffeinated/decaffeinated beverages or chocolate 12 hours prior to your test. Do not take any antihistamines 12 hours prior to your test. If the patient has contrast allergy: Patient will need a prescription for Prednisone  and very clear instructions (as follows): Prednisone  50 mg - take 13 hours prior to test Take another Prednisone  50 mg 7 hours prior to test Take another Prednisone  50 mg 1 hour prior to test Take Benadryl 50 mg 1 hour prior to test Patient must complete all four doses of above prophylactic medications. Patient will need a ride after test due to Benadryl.  On the Day of the Test: Drink plenty of water  until 1 hour prior to the test. Do not eat any food 1 hour prior to test. You may take your regular medications prior to the test.  Take metoprolol  (Lopressor ) two hours prior to test. If you take Furosemide /Hydrochlorothiazide /Spironolactone /Chlorthalidone, please HOLD on the morning of the test. Patients who wear a continuous glucose monitor MUST remove the device prior to scanning. FEMALES- please wear underwire-free bra if available, avoid dresses & tight clothing      After the Test: Drink plenty of water . After receiving IV contrast, you may experience a mild flushed feeling. This is normal. On occasion, you may experience a mild rash up to 24 hours after the test. This is not dangerous. If this occurs, you can take Benadryl  25 mg, Zyrtec, Claritin, or Allegra and increase your fluid intake. (Patients taking Tikosyn should avoid Benadryl, and may take Zyrtec, Claritin, or Allegra) If you experience trouble breathing, this can be serious. If it is severe call 911 IMMEDIATELY. If it is mild, please call our office.  We will call to schedule your test 2-4 weeks out understanding that some insurance  companies will need an authorization prior to the service being performed.   For more information and frequently asked questions, please visit our website : [email or web address]  For non-scheduling related questions, please contact the cardiac imaging nurse navigator should you have any questions/concerns: Cardiac Imaging Nurse Navigators Direct Office Dial: [phone]   For scheduling needs, including cancellations and rescheduling, please call Brittany, [phone].    Follow-Up: At [facility], you and your health needs are our priority.  As part of our continuing mission to provide you with exceptional heart care, we have created designated Provider Care Teams.  These Care Teams include your primary Cardiologist (physician) and Advanced Practice Providers (APPs -  Physician Assistants and Nurse Practitioners) who all work together to provide you with the care you need, when you need it.  We recommend signing up for the patient portal called MyChart.  Sign up information is provided on this After Visit Summary.  MyChart is used to connect with patients for Virtual Visits (Telemedicine).  Patients are able to view lab/test results, encounter notes, upcoming appointments, etc.  Non-urgent messages can be sent to your provider as well.   To learn more about what you can do with MyChart, go to [email or web address].    Your next appointment:   3 month(s)  Provider:   Debby Sor, MD     Other Instructions Thank you for choosing [facility]!

## 2023-02-14 ENCOUNTER — Encounter: Payer: Self-pay | Admitting: Cardiovascular Disease

## 2023-02-17 ENCOUNTER — Other Ambulatory Visit: Payer: Self-pay | Admitting: Family Medicine

## 2023-02-25 ENCOUNTER — Encounter (HOSPITAL_COMMUNITY): Payer: Self-pay

## 2023-02-28 ENCOUNTER — Other Ambulatory Visit: Payer: Self-pay | Admitting: Cardiovascular Disease

## 2023-03-01 ENCOUNTER — Ambulatory Visit (HOSPITAL_COMMUNITY)
Admission: RE | Admit: 2023-03-01 | Discharge: 2023-03-01 | Disposition: A | Discharge status: Home / Self Care | Payer: 59 | Source: Ambulatory Visit | Attending: Cardiovascular Disease | Admitting: Cardiovascular Disease

## 2023-03-01 DIAGNOSIS — R079 Chest pain, unspecified: Secondary | ICD-10-CM | POA: Diagnosis not present

## 2023-03-01 MED ORDER — DILTIAZEM HCL 25 MG/5ML IV SOLN: 10.0000 mg | INTRAVENOUS | Status: DC | PRN

## 2023-03-01 MED ORDER — IOHEXOL 350 MG/ML SOLN
95.0000 mL | Freq: Once | INTRAVENOUS | Status: AC | PRN
  Administered 2023-03-01: 95 mL via INTRAVENOUS

## 2023-03-01 MED ORDER — METOPROLOL TARTRATE 5 MG/5ML IV SOLN: 10.0000 mg | Freq: Once | INTRAVENOUS | Status: DC | PRN

## 2023-03-01 MED ORDER — NITROGLYCERIN 0.4 MG SL SUBL
0.8000 mg | SUBLINGUAL_TABLET | Freq: Once | SUBLINGUAL | Status: AC
  Administered 2023-03-01: 0.8 mg via SUBLINGUAL

## 2023-03-01 MED ORDER — NITROGLYCERIN 0.4 MG SL SUBL
SUBLINGUAL_TABLET | SUBLINGUAL | Status: AC
  Filled 2023-03-01: qty 2

## 2023-03-02 ENCOUNTER — Telehealth: Payer: Self-pay

## 2023-03-02 ENCOUNTER — Other Ambulatory Visit (HOSPITAL_COMMUNITY): Payer: Self-pay

## 2023-03-02 NOTE — Telephone Encounter (Signed)
 Pharmacy Patient Advocate Encounter   Received notification from CoverMyMeds/Office that prior authorization for Ozempic (0.25 or 0.5 MG/DOSE) 2MG /3ML pen-injectors is required/requested.   Insurance verification completed.   The patient is insured through [facility] .   Per test claim: PA required; PA submitted to above mentioned insurance via CoverMyMeds Key/confirmation #/EOC (Key: [id number])     Status is pending

## 2023-03-02 NOTE — Telephone Encounter (Signed)
 Please see previous chartnotes as this Rx was inquired about     Pharmacy Patient Advocate Encounter  Received notification from [facility] that Prior Authorization for Ozempic (0.25 or 0.5 MG/DOSE) 2MG /3ML pen-injectors has been APPROVED from 2.25.25 to 2.25.28. Ran test claim, Copay is $24.99. This test claim was processed through [facility]- copay amounts may vary at other pharmacies due to pharmacy/plan contracts, or as the patient moves through the different stages of their insurance plan.   PA #/Case ID/Reference #: Key: [id number]

## 2023-03-02 NOTE — Telephone Encounter (Signed)
 Pt has been notified.

## 2023-03-10 LAB — POCT I-STAT CREATININE: Creatinine, Ser: 0.9 mg/dL (ref 0.44–1.00)

## 2023-03-15 ENCOUNTER — Other Ambulatory Visit: Payer: Self-pay | Admitting: Family Medicine

## 2023-03-26 ENCOUNTER — Telehealth: Payer: Self-pay | Admitting: Cardiovascular Disease

## 2023-03-26 NOTE — Telephone Encounter (Signed)
 Patient called to follow-up on CT CORONARY MORPH test results.

## 2023-03-26 NOTE — Telephone Encounter (Signed)
Pt informed of providers result & recommendations. Pt verbalized understanding. All questions, if any, were answered.

## 2023-03-28 ENCOUNTER — Other Ambulatory Visit: Payer: Self-pay | Admitting: Cardiovascular Disease

## 2023-03-29 LAB — HM DEXA SCAN

## 2023-04-05 ENCOUNTER — Encounter: Payer: Self-pay | Admitting: Family Medicine

## 2023-04-05 ENCOUNTER — Ambulatory Visit (INDEPENDENT_AMBULATORY_CARE_PROVIDER_SITE_OTHER): Payer: Self-pay | Admitting: Family Medicine

## 2023-04-05 VITALS — BP 122/60 | HR 46 | Temp 97.9°F | Ht 62.5 in | Wt 226.0 lb

## 2023-04-05 DIAGNOSIS — R0609 Other forms of dyspnea: Secondary | ICD-10-CM | POA: Diagnosis not present

## 2023-04-05 DIAGNOSIS — E119 Type 2 diabetes mellitus without complications: Secondary | ICD-10-CM

## 2023-04-05 DIAGNOSIS — E559 Vitamin D deficiency, unspecified: Secondary | ICD-10-CM | POA: Diagnosis not present

## 2023-04-05 DIAGNOSIS — Z0000 Encounter for general adult medical examination without abnormal findings: Secondary | ICD-10-CM

## 2023-04-05 LAB — BASIC METABOLIC PANEL WITH GFR
BUN: 20 mg/dL (ref 6–23)
CO2: 24 meq/L (ref 19–32)
Calcium: 9.7 mg/dL (ref 8.4–10.5)
Chloride: 107 meq/L (ref 96–112)
Creatinine, Ser: 0.85 mg/dL (ref 0.40–1.20)
GFR: 72.35 mL/min (ref >60.00)
Glucose, Bld: 86 mg/dL (ref 70–99)
Potassium: 4 meq/L (ref 3.5–5.1)
Sodium: 140 meq/L (ref 135–145)

## 2023-04-05 LAB — HEPATIC FUNCTION PANEL
ALT: 16 U/L (ref 0–35)
AST: 16 U/L (ref 0–37)
Albumin: 4.2 g/dL (ref 3.5–5.2)
Alkaline Phosphatase: 86 U/L (ref 39–117)
Bilirubin, Direct: 0.1 mg/dL (ref 0.0–0.3)
Total Bilirubin: 0.5 mg/dL (ref 0.2–1.2)
Total Protein: 7.7 g/dL (ref 6.0–8.3)

## 2023-04-05 LAB — MICROALBUMIN / CREATININE URINE RATIO
Creatinine,U: 65.4 mg/dL
Microalb Creat Ratio: UNDETERMINED mg/g (ref 0.0–30.0)
Microalb, Ur: <0.7 mg/dL

## 2023-04-05 LAB — CBC WITH DIFFERENTIAL/PLATELET
Basophils Absolute: 0 10*3/uL (ref 0.0–0.1)
Basophils Relative: 0.2 % (ref 0.0–3.0)
Eosinophils Absolute: 0 10*3/uL (ref 0.0–0.7)
Eosinophils Relative: 0 % (ref 0.0–5.0)
HCT: 41.8 % (ref 36.0–46.0)
Hemoglobin: 13.8 g/dL (ref 12.0–15.0)
Lymphocytes Relative: 25 % (ref 12.0–46.0)
Lymphs Abs: 2.1 10*3/uL (ref 0.7–4.0)
MCHC: 32.9 g/dL (ref 30.0–36.0)
MCV: 77.9 fl — ABNORMAL LOW (ref 78.0–100.0)
Monocytes Absolute: 0.6 10*3/uL (ref 0.1–1.0)
Monocytes Relative: 7.5 % (ref 3.0–12.0)
Neutro Abs: 5.6 10*3/uL (ref 1.4–7.7)
Neutrophils Relative %: 67.3 % (ref 43.0–77.0)
Platelets: 239 10*3/uL (ref 150.0–400.0)
RBC: 5.37 Mil/uL — ABNORMAL HIGH (ref 3.87–5.11)
RDW: 18.1 % — ABNORMAL HIGH (ref 11.5–15.5)
WBC: 8.3 10*3/uL (ref 4.0–10.5)

## 2023-04-05 LAB — VITAMIN D 25 HYDROXY (VIT D DEFICIENCY, FRACTURES): VITD: 35.48 ng/mL (ref 30.00–100.00)

## 2023-04-05 LAB — LIPID PANEL
Cholesterol: 112 mg/dL (ref 0–200)
HDL: 40.5 mg/dL (ref >39.00)
LDL Cholesterol: 44 mg/dL (ref 0–99)
NonHDL: 71.25
Total CHOL/HDL Ratio: 3
Triglycerides: 134 mg/dL (ref 0.0–149.0)
VLDL: 26.8 mg/dL (ref 0.0–40.0)

## 2023-04-05 LAB — TSH: TSH: 1.37 u[IU]/mL (ref 0.35–5.50)

## 2023-04-05 LAB — HEMOGLOBIN A1C: Hgb A1c MFr Bld: 5.5 % (ref 4.6–6.5)

## 2023-04-05 MED ORDER — OZEMPIC (0.25 OR 0.5 MG/DOSE) 2 MG/3ML ~~LOC~~ SOPN
0.5000 mg | PEN_INJECTOR | SUBCUTANEOUS | 1 refills | Status: AC
Start: 2023-04-05 — End: ?

## 2023-04-05 NOTE — Progress Notes (Signed)
 Subjective:    Patient ID: Ariana [name], female    DOB: [birth year], 63 y.o.   MRN: [id number]  HPI CPE- UTD on pap, colonoscopy, mammo, eye exam, foot exam, Tdap,  Patient Care Team    Relationship Specialty Notifications Start End  Sheliah Hatch, MD PCP - General Family Medicine  08/29/15   Lennette Bihari, MD PCP - Cardiology Cardiology  11/28/19   Oretha Milch, MD Consulting Physician Pulmonary Disease  05/05/18   Lennette Bihari, MD Consulting Physician Cardiology  05/05/18   Zelphia Cairo, MD Consulting Physician Obstetrics and Gynecology  05/05/18   Marcelino Duster, PA Physician Assistant Cardiology  12/20/20     Health Maintenance  Topic Date Due   Pneumococcal Vaccine 51-38 Years old (2 of 2 - PCV) 11/07/2019   INFLUENZA VACCINE  Never done   COVID-19 Vaccine (7 - 2024-25 season) 09/06/2022   Diabetic kidney evaluation - Urine ACR  02/19/2023   MAMMOGRAM  04/24/2023   OPHTHALMOLOGY EXAM  05/07/2023   FOOT EXAM  06/04/2023   HEMOGLOBIN A1C  07/05/2023   Diabetic kidney evaluation - eGFR measurement  01/05/2024   Cervical Cancer Screening (HPV/Pap Cotest)  04/23/2024   DTaP/Tdap/Td (2 - Td or Tdap) 08/28/2025   Colonoscopy  06/10/2026   Hepatitis C Screening  Completed   HIV Screening  Completed   HPV VACCINES  Aged Out   Zoster Vaccines- Shingrix  Discontinued      Review of Systems Patient reports no vision/ hearing changes, adenopathy,fever, weight change,  persistant/recurrent hoarseness , swallowing issues palpitations, edema, persistant/recurrent cough, hemoptysis, gastrointestinal bleeding (melena, rectal bleeding), abdominal pain, significant heartburn, bowel changes, GU symptoms (dysuria, hematuria, incontinence), Gyn symptoms (abnormal  bleeding, pain),  syncope, focal weakness, memory loss, numbness & tingling, skin/hair/nail changes, abnormal bruising or bleeding, anxiety, or depression.   + Chest pain and SOB w/ exertion- has seen Cards.  Did  coronary CT.      Objective:   Physical Exam General Appearance:    Alert, cooperative, no distress, appears stated age, obese  Head:    Normocephalic, without obvious abnormality, atraumatic  Eyes:    PERRL, conjunctiva/corneas clear, EOM's intact both eyes  Ears:    Normal TM's and external ear canals, both ears  Nose:   Nares normal, septum midline, mucosa normal, no drainage    or sinus tenderness  Throat:   Lips, mucosa, and tongue normal; teeth and gums normal  Neck:   Supple, symmetrical, trachea midline, no adenopathy;    Thyroid: no enlargement/tenderness/nodules  Back:     Symmetric, no curvature, ROM normal, no CVA tenderness  Lungs:     Clear to auscultation bilaterally, respirations unlabored  Chest Wall:    No tenderness or deformity   Heart:    Regular rate and rhythm, S1 and S2 normal, no murmur, rub   or gallop  Breast Exam:    Deferred to GYN  Abdomen:     Soft, non-tender, bowel sounds active all four quadrants,    no masses, no organomegaly  Genitalia:    Deferred to GYN  Rectal:    Extremities:   Extremities normal, atraumatic, no cyanosis or edema  Pulses:   2+ and symmetric all extremities  Skin:   Skin color, texture, turgor normal, no rashes or lesions  Lymph nodes:   Cervical, supraclavicular, and axillary nodes normal  Neurologic:   CNII-XII intact, normal strength, sensation and reflexes    throughout  Assessment & Plan:

## 2023-04-05 NOTE — Assessment & Plan Note (Signed)
 Ongoing issue.  Recent cardiac CT was done an unremarkable.  Suspect her sxs are in part due to symptomatic bradycardia.  Cards decreased her Bisoprolol to 2.5mg  daily.  Will stop Amlodipine and monitor both HR and BP.  Pt expressed understanding and is in agreement w/ plan.

## 2023-04-05 NOTE — Patient Instructions (Signed)
 Follow up in 3-4 weeks to recheck pulse and BP We'll notify you of your lab results and make any changes if needed STOP the Amlodipine CONTINUE the 1/2 tab of bisoprolol  Make sure you are drinking enough water throughout the day Call with any questions or concerns Stay Safe!  Stay Healthy! Happy Spring!!!

## 2023-04-05 NOTE — Assessment & Plan Note (Signed)
 Pt's PE WNL w/ exception of bradycardia.  UTD on pap, colonoscopy, mammo, eye exam, foot exam, immunizations.  Check labs.  Anticipatory guidance provided.

## 2023-05-04 ENCOUNTER — Ambulatory Visit: Admitting: Family Medicine

## 2023-05-04 ENCOUNTER — Encounter: Payer: Self-pay | Admitting: Family Medicine

## 2023-05-04 VITALS — BP 124/72 | HR 72 | Temp 98.3°F | Ht 62.5 in | Wt 227.4 lb

## 2023-05-04 DIAGNOSIS — R001 Bradycardia, unspecified: Secondary | ICD-10-CM | POA: Diagnosis not present

## 2023-05-04 NOTE — Patient Instructions (Signed)
 Follow up in 3 months to recheck sugar You look GREAT!  No med changes at this time! Call with any questions or concerns Stay Safe!  Stay Healthy! Happy Spring!!

## 2023-05-04 NOTE — Progress Notes (Signed)
   Subjective:    Patient ID: Ariana [name], female    DOB: 1960-10-31, 63 y.o.   MRN: [id number]  HPI HTN- at last visit we stopped her Amlodipine  due to fatigue, low HR, and dizziness.  She is on Bisoprolol  2.5mg  daily.  Pt's HR has increased from 41 --> 72.  She states that after 1 week off medication, she felt 'the best I've felt in a long time'.  Fatigue and SOB improved.  Less dizzy.   Review of Systems For ROS see HPI     Objective:   Physical Exam Vitals reviewed.  Constitutional:      General: She is not in acute distress.    Appearance: Normal appearance. She is well-developed. She is not ill-appearing.  HENT:     Head: Normocephalic and atraumatic.  Eyes:     Conjunctiva/sclera: Conjunctivae normal.     Pupils: Pupils are equal, round, and reactive to light.  Neck:     Thyroid : No thyromegaly.  Cardiovascular:     Rate and Rhythm: Normal rate and regular rhythm.     Heart sounds: Normal heart sounds. No murmur heard. Pulmonary:     Effort: Pulmonary effort is normal. No respiratory distress.     Breath sounds: Normal breath sounds.  Abdominal:     General: There is no distension.     Palpations: Abdomen is soft.     Tenderness: There is no abdominal tenderness.  Musculoskeletal:     Cervical back: Normal range of motion and neck supple.  Lymphadenopathy:     Cervical: No cervical adenopathy.  Skin:    General: Skin is warm and dry.  Neurological:     Mental Status: She is alert and oriented to person, place, and time.  Psychiatric:        Behavior: Behavior normal.           Assessment & Plan:  Symptomatic bradycardia- resolved.  Pt reports feeling so much better now that her HR is in normal range.  Continue current meds.  No changes at this time.

## 2023-05-11 LAB — HM DIABETES EYE EXAM

## 2023-05-12 ENCOUNTER — Encounter: Payer: Self-pay | Admitting: Cardiovascular Disease

## 2023-05-12 ENCOUNTER — Ambulatory Visit: Payer: 59 | Attending: Cardiovascular Disease | Admitting: Cardiovascular Disease

## 2023-05-12 DIAGNOSIS — I2510 Atherosclerotic heart disease of native coronary artery without angina pectoris: Secondary | ICD-10-CM

## 2023-05-12 DIAGNOSIS — R0609 Other forms of dyspnea: Secondary | ICD-10-CM

## 2023-05-12 DIAGNOSIS — E119 Type 2 diabetes mellitus without complications: Secondary | ICD-10-CM

## 2023-05-12 DIAGNOSIS — E785 Hyperlipidemia, unspecified: Secondary | ICD-10-CM | POA: Diagnosis not present

## 2023-05-12 DIAGNOSIS — R079 Chest pain, unspecified: Secondary | ICD-10-CM | POA: Diagnosis not present

## 2023-05-12 MED ORDER — BISOPROLOL FUMARATE 5 MG PO TABS: 2.5000 mg | ORAL_TABLET | Freq: Every day | ORAL | 3 refills | Status: DC

## 2023-05-12 NOTE — Progress Notes (Signed)
 Cardiology Office Note    Date:  05/12/2023   ID:  Cricket, Baldyga [birth year], MRN [id number]  PCP:  Jess Morita, MD  Cardiologist:  Magnus Schuller, MD   87-month follow-up evaluation.  History of Present Illness:  Ariana [name] is a 63 y.o. female who was initially referred through the courtesy of Dr. Paulla Bossier for evaluation of exertional shortness of breath and recent documentation of grade 2 diastolic dysfunction.  I initially saw her in October 2019.  I last saw her on February 12, 2023.   Ariana [name] denies any known cardiac history.  She has a history of obesity, hypertension, obstructive sleep apnea, GERD, and recently has started to notice a change in symptoms with development of exertional shortness of breath.  She had smoked during her late teenage years for approximately 3 to 4 years but quit in 1982.  She was evaluated from a pulmonary standpoint by Drs. Villa Greaser and Lex Redbird.  Pulmonary function studies did not reveal evidence for obstruction.  She had mild small airway disease.  An echo Doppler study revealed an ejection fraction of 60 to 65% with normal wall motion and showed grade 2 diastolic dysfunction.  A chest x-ray did not reveal acute cardiopulmonary disease.  A d-dimer was negative.  BNP was 41.  Upon further questioning she states that recently she also has noticed some mild exertional chest tightness since April.  Oftentimes she develops this when she is out of breath.  If she were to walk fast she will have to stop to catch her breath.  She lives by herself.  Earlier this year she had laboratory which had shown a total cholesterol 171 LDL cholesterol 97 with triglycerides at 101.  At that time her potassium was low at 3.2.  She has recently been on amlodipine  5 mg and hydrochlorothiazide  25 mg in addition to supplemental potassium 20 mEq.  She has been on simvastatin  40 mg for hyperlipidemia.  She has been taking Lasix  as needed.  She does have a history of  panic attacks and anxiety and depression for which she is on Xanax  as well as Wellbutrin  XL and Prozac .    During her initial evaluation with me in October 2019 and with her grade 2 diastolic dysfunction I recommended discontinuance of hydrochlorothiazide  and supplemental pad potassium and initiate spironolactone  therapy.  With her father dying of an MI at age 96 and symptoms of exertional dyspnea with chest fullness I recommended CT coronary angio for further evaluation.  Her LDL cholesterol was 97 I recommend more aggressive lipid therapy with discontinuance of simvastatin  and changing to rosuvastatin .  She was seen by Ervin Heath, PA in December 2019 for follow-up evaluation.  Her coronary CTA showed a minimal calcium  score of 3.89.  She was not found to have evidence for obstructive CAD with very minimal plaque in the proximal LAD.  I saw her on January 23, 2019. She has been seen by Dr. Alva and was started on inhaler therapy for asthma.  She also was started on Jardiance  for diabetes mellitus and had a hemoglobin A1c of 7.3.  She has noticed some trace ankle edema.  At times she has noticed an occasional palpitation.  She is now on Symbicort  and last week she was recently started on furosemide  20 mg.  She is on spironolactone  12.5 mg twice a day in addition to Jardiance  10 mg daily and amlodipine  5 mg.  She is now on rosuvastatin  20 mg for hyperlipidemia.  Ariana [name] is status post bilateral knee replacements with her initial right knee replacement on August 25, 2021.  She was evaluated by Dr. Lauro Portal on December 19, 2021 for preoperative evaluation prior to undergoing elective left total knee replacement surgery on December 22, 2021.  I saw her on January 28, 2022 for 3-year follow-up evaluation.  Ariana [name] denies any chest pain but admits to lower extremity edema.  She has lost approximately 60 pounds of weight with a peak weight of 267 down most recently 206.  She is followed by Dr. Paulla Bossier  for primary care.  She is on amlodipine  5 mg, furosemide  20 mg as needed, bisoprolol  5 mg daily, spironolactone  12.5 mg twice a day and takes Jardiance  10 mg daily.  She is also on Ozempic  0.5 mg weekly injection.  She denies any chest pain, PND orthopnea, presyncope or syncope.    I last saw her on February 12, 2023.  She has been trying to walk every day.  At times, she admits to some mild chest sensation particularly when she is walking or particularly climbing up steps.  This typically resolves spontaneously.  She has had insurance issues with denial of her Ozempic  which has resulted in weight gain of at least 16 pounds.  She has been trying to fight with the insurance company so that she can continue with Ozempic .  She denies any chest pain or shortness of breath.  Her heart rate tends to be slow.  She currently is on amlodipine  5 mg, bisoprolol  5 mg, spironolactone  12.5 mg twice a day and has a prescription for as needed furosemide .  She continues to be on Jardiance  10 mg for diabetes mellitus.  She was recently given a prescription to try Mounjaro but she has not yet started this awaiting insurance approval.  She is on rosuvastatin  20 mg daily for hyperlipidemia.  During that evaluation, she was bradycardic with a heart rate at 42.  I recommended she reduce bisoprolol  from 5 mg down to 2.5 mg and that she continue amlodipine  5 mg daily.  With her episode of mild chest discomfort I recommended she undergo coronary CTA for reassessment 5 years after prior study was done in 2019.  At time she admitted to trace ankle edema in has taken furosemide  on an as-needed basis.  She underwent coronary CTA on March 01, 2023.  Calcium  score was 49.3 (75 percentile).  Total plaque volume was 101 mm with calcified plaque 12 mm and noncalcified plaque at 89 mm.  There was minimal less than 25% plaque in the LAD and RCA.  She had undergone laboratory on April 05, 2023.  Hemoglobin was 13.8, hematocrit 41.8.  MCV 77.9.   Lipid studies were excellent with total cholesterol 112, triglycerides 134, HDL 40.5, and LDL 44.  Presently, she feels well.  She sees Dr. Paulla Bossier for primary care.  She is now off amlodipine  due to low blood pressure stop by Dr. Paulla Bossier.  She continues to be on bisoprolol  2.5 mg, as needed furosemide  20 mg, spironolactone  12.5 mg and Jardiance  10 mg.  She is back on semaglutide .  She presents for follow-up evaluation.  Past Medical History:  Diagnosis Date   Anxiety    Coronary artery disease    on CT   Depression    Diabetes mellitus without complication (HCC)    Dyspnea    pt denies at preop on 12/09/21   GERD (gastroesophageal reflux disease)    Hyperlipidemia    Hypertension  OA (osteoarthritis)    in Bil knees/ gets steroid injections for pain   Ovarian cyst    right ovary/had ablation    Past Surgical History:  Procedure Laterality Date   COLONOSCOPY     ENDOMETRIAL ABLATION  2012   OVARIAN CYST REMOVAL Right 2012   TOTAL KNEE ARTHROPLASTY Right 08/25/2021   Procedure: TOTAL KNEE ARTHROPLASTY;  Surgeon: Murleen Arms, MD;  Location: [facility];  Service: Orthopedics;  Laterality: Right;   TOTAL KNEE ARTHROPLASTY Left 12/22/2021   Procedure: TOTAL KNEE ARTHROPLASTY;  Surgeon: Murleen Arms, MD;  Location: [facility];  Service: Orthopedics;  Laterality: Left;   WISDOM TOOTH EXTRACTION      Current Medications: Outpatient Medications Prior to Visit  Medication Sig Dispense Refill   acetaminophen  (TYLENOL ) 500 MG tablet Take 1,000 mg by mouth every 8 (eight) hours as needed for moderate pain.     ALPRAZolam  (XANAX ) 1 MG tablet TAKE 1/2 TABLET BY MOUTH TWICE A DAY AS NEEDED FOR ANXIETY 30 tablet 3   bisoprolol  (ZEBETA ) 5 MG tablet TAKE 1 TABLET (5 MG TOTAL) BY MOUTH DAILY. 90 tablet 1   buPROPion  (WELLBUTRIN  XL) 300 MG 24 hr tablet TAKE 1 TABLET BY MOUTH EVERY DAY 90 tablet 1   JARDIANCE  10 MG TABS tablet TAKE 1 TABLET BY MOUTH EVERY DAY 30 tablet 6   omeprazole   (PRILOSEC) 40 MG capsule TAKE 1 CAPSULE BY MOUTH EVERY DAY 90 capsule 0   rosuvastatin  (CRESTOR ) 20 MG tablet TAKE 1 TABLET BY MOUTH EVERY DAY 90 tablet 3   Semaglutide ,0.25 or 0.5MG /DOS, (OZEMPIC , 0.25 OR 0.5 MG/DOSE,) 2 MG/3ML SOPN Inject 0.5 mg into the skin once a week. 3 mL 1   spironolactone  (ALDACTONE ) 25 MG tablet TAKE 1/2 TABLET TWICE A DAY BY MOUTH 90 tablet 3   Vitamin D , Ergocalciferol , (DRISDOL ) 1.25 MG (50000 UNIT) CAPS capsule Take 1 capsule (50,000 Units total) by mouth every 7 (seven) days. 7 capsule 12   furosemide  (LASIX ) 20 MG tablet TAKE 1 TABLET BY MOUTH EVERY DAY AS NEEDED 90 tablet 1   nystatin-triamcinolone ointment (MYCOLOG) Apply 1 Application topically 2 (two) times daily as needed (yeast infections).     ondansetron  (ZOFRAN ) 4 MG tablet TAKE 1 TABLET BY MOUTH EVERY 8 (EIGHT) HOURS AS NEEDED FOR UP TO 14 DAYS FOR NAUSEA OR VOMITING.     No facility-administered medications prior to visit.     Allergies:   Penicillins   Social History   Socioeconomic History   Marital status: Divorced    Spouse name: Not on file   Number of children: Not on file   Years of education: Not on file   Highest education level: Associate degree: occupational, Scientist, product/process development, or vocational program  Occupational History   Not on file  Tobacco Use   Smoking status: Former    Current packs/day: 0.00    Types: Cigarettes    Quit date: 01/18/1981    Years since quitting: 42.3   Smokeless tobacco: Never   Tobacco comments:    pt states she smoker for 3 years in high school (10/28/17)  Vaping Use   Vaping status: Never Used  Substance and Sexual Activity   Alcohol  use: No   Drug use: No   Sexual activity: Never  Other Topics Concern   Not on file  Social History Narrative   Not on file   Social Drivers of Health   Financial Resource Strain: Low Risk  (01/04/2023)   Overall Physicist, medical Strain (  CARDIA)    Difficulty of Paying Living Expenses: Not hard at all  Food  Insecurity: No Food Insecurity (01/04/2023)   Hunger Vital Sign    Worried About Running Out of Food in the Last Year: Never true    Ran Out of Food in the Last Year: Never true  Transportation Needs: No Transportation Needs (01/04/2023)   PRAPARE - Administrator, Civil Service (Medical): No    Lack of Transportation (Non-Medical): No  Physical Activity: Insufficiently Active (01/04/2023)   Exercise Vital Sign    Days of Exercise per Week: 2 days    Minutes of Exercise per Session: 20 min  Stress: Stress Concern Present (01/04/2023)   [employer] of Occupational Health - Occupational Stress Questionnaire    Feeling of Stress : To some extent  Social Connections: Moderately Isolated (01/04/2023)   Social Connection and Isolation Panel [NHANES]    Frequency of Communication with Friends and Family: Once a week    Frequency of Social Gatherings with Friends and Family: Twice a week    Attends Religious Services: More than 4 times per year    Active Member of [employer] or Organizations: No    Attends Engineer, structural: Not on file    Marital Status: Divorced    She is separated and single.  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  There is no alcohol  use.  She does not routinely exercise.  Family History:  The patient's family history includes Arthritis in her brother and father; Cancer in her maternal aunt; Dementia in her mother; Diabetes in her brother; Heart attack in her father; Heart disease in her brother and father; Hyperlipidemia in her father; Hypertension in her father; Liver disease in her brother.   Her mother died at age 55 with dementia and had thyroid  problems.  Her father died with a heart attack at age 45.  She has been depressed since her son age 61 died in a motor vehicle accident.  ROS General: Negative; No fevers, chills, or night sweats;  HEENT: Negative; No changes in vision or hearing, sinus  congestion, difficulty swallowing Pulmonary: Negative; No cough, wheezing, shortness of breath, hemoptysis Cardiovascular: See HPI GI: Negative; No nausea, vomiting, diarrhea, or abdominal pain GU: Negative; No dysuria, hematuria, or difficulty voiding; status post right ovary and cyst removed in 2009 Musculoskeletal: Negative; no myalgias, joint pain, or weakness Hematologic/Oncology: Negative; no easy bruising, bleeding Endocrine: Negative; no heat/cold intolerance; no diabetes Neuro: Negative; no changes in balance, headaches Skin: Negative; No rashes or skin lesions Psychiatric: Positive for anxiety and depression Sleep: Negative; No snoring, daytime sleepiness, hypersomnolence, bruxism, restless legs, hypnogognic hallucinations, no cataplexy Other comprehensive 14 point system review is negative.   PHYSICAL EXAM:   VS:  BP 114/72 (BP Location: Left Arm, Patient Position: Sitting, Cuff Size: Large)   Pulse 74   Ht 5\' 3"  (1.6 m)   Wt 229 lb 6.4 oz (104.1 kg)   SpO2 97%   BMI 40.64 kg/m     Repeat blood pressure by me 110/70  Wt Readings from Last 3 Encounters:  05/12/23 229 lb 6.4 oz (104.1 kg)  05/04/23 227 lb 6.4 oz (103.1 kg)  04/05/23 226 lb (102.5 kg)    When seen by me in January 2021 her weight was 267 pounds; and when seen in January 2024 weight was 206 pounds.  General: Alert, oriented, no distress.  Skin: normal turgor, no rashes, warm and dry  HEENT: Normocephalic, atraumatic. Pupils equal round and reactive to light; sclera anicteric; extraocular muscles intact; Nose without nasal septal hypertrophy Mouth/Parynx benign; Mallinpatti scale 3 Neck: No JVD, no carotid bruits; normal carotid upstroke Lungs: clear to ausculatation and percussion; no wheezing or rales Chest wall: without tenderness to palpitation Heart: PMI not displaced, RRR, s1 s2 normal, 1/6 systolic murmur, no diastolic murmur, no rubs, gallops, thrills, or heaves Abdomen: soft, nontender; no  hepatosplenomehaly, BS+; abdominal aorta nontender and not dilated by palpation. Back: no CVA tenderness Pulses 2+ Musculoskeletal: full range of motion, normal strength, no joint deformities Extremities: no clubbing cyanosis or edema, Homan's sign negative  Neurologic: grossly nonfocal; Cranial nerves grossly wnl Psychologic: Normal mood and affect    Studies/Labs Reviewed:   EKG Interpretation Date/Time:  Wednesday May 12 2023 09:32:01 EDT Ventricular Rate:  74 PR Interval:  164 QRS Duration:  86 QT Interval:  408 QTC Calculation: 452 R Axis:   32  Text Interpretation: Normal sinus rhythm Normal ECG When compared with ECG of 12-Feb-2023 11:34, Vent. rate has increased BY  32 BPM Nonspecific T wave abnormality no longer evident in Anterior leads Confirmed by Magnus Schuller ([id number]) on 05/12/2023 10:04:22 AM    February 12, 2023 ECG (independently read by me): Marked sinus bradycardia at 42 bpm.  None specific T wave abnormality.  January 28, 2021 ECG (independently read by me): NSR at 73, No ectopy.  January 23, 2019 ECG (independently read by me): Normal sinus rhythm at 95 bpm.  No ectopy.  Normal intervals.  October 2019 ECG (independently read by me): Normal sinus rhythm with isolated PVC.  Nonspecific ST-T changes.  PR interval 140 ms QTc interval 454 ms.  Recent Labs:    Latest Ref Rng & Units 04/05/2023   10:20 AM 03/01/2023   12:10 PM 01/05/2023    9:52 AM  BMP  Glucose 70 - 99 mg/dL 86   83   BUN 6 - 23 mg/dL 20   31   Creatinine 8.11 - 1.20 mg/dL 9.14  7.82  9.56   Sodium 135 - 145 mEq/L 140   141   Potassium 3.5 - 5.1 mEq/L 4.0   4.1   Chloride 96 - 112 mEq/L 107   106   CO2 19 - 32 mEq/L 24   25   Calcium  8.4 - 10.5 mg/dL 9.7   9.7         Latest Ref Rng & Units 04/05/2023   10:20 AM 09/30/2022    9:57 AM 02/18/2022    2:07 PM  Hepatic Function  Total Protein 6.0 - 8.3 g/dL 7.7  7.3  7.2   Albumin 3.5 - 5.2 g/dL 4.2  4.1  4.0   AST 0 - 37 U/L 16  23  16     ALT 0 - 35 U/L 16  21  13    Alk Phosphatase 39 - 117 U/L 86  76  85   Total Bilirubin 0.2 - 1.2 mg/dL 0.5  0.4  0.4   Bilirubin, Direct 0.0 - 0.3 mg/dL 0.1  0.0  0.1        Latest Ref Rng & Units 04/05/2023   10:20 AM 09/30/2022    9:57 AM 02/18/2022    2:07 PM  CBC  WBC 4.0 - 10.5 K/uL 8.3  6.9  7.6   Hemoglobin 12.0 - 15.0 g/dL 21.3  08.6  57.8   Hematocrit 36.0 - 46.0 % 41.8  41.0  37.7   Platelets 150.0 -  400.0 K/uL 239.0  261.0  228.0    Lab Results  Component Value Date   MCV 77.9 (L) 04/05/2023   MCV 79.1 09/30/2022   MCV 77.3 (L) 02/18/2022   Lab Results  Component Value Date   TSH 1.37 04/05/2023   Lab Results  Component Value Date   HGBA1C 5.5 04/05/2023     BNP No results found for: "BNP"  ProBNP    Component Value Date/Time   PROBNP 41.0 07/30/2017 1553     Lipid Panel     Component Value Date/Time   CHOL 112 04/05/2023 1020   TRIG 134.0 04/05/2023 1020   HDL 40.50 04/05/2023 1020   CHOLHDL 3 04/05/2023 1020   VLDL 26.8 04/05/2023 1020   LDLCALC 44 04/05/2023 1020   LDLDIRECT 76.0 01/30/2020 1348     RADIOLOGY: No results found.   Additional studies/ records that were reviewed today include:  I reviewed the office records of Dr. Laymon Priest, the pulmonology evaluations, recent laboratory, echo Doppler assessment and PFTs. Coronary CTA was reviewed.  ASSESSMENT:    1. Chest pain of uncertain etiology     PLAN:  Ms. Crisol Melian is a 63 year old female who has a history of morbid obesity, hypertension, anxiety/depression, and when I initially saw her in October 2019 had noticed a definite change with development of increasing exertional dyspnea and some exertional chest fullness and pressure.  She was not very active due to arthritis in her knees.  She was found to have grade 2 diastolic dysfunction on her echocardiography and had onset of EF at 60 to 65%.  She was taken off hydrochlorothiazide  and has been on spironolactone   currently at 12.5 mg twice a day.  When last seen by me in January 2024 she had recently been started on as needed furosemide  as well as Symbicort  due to some mild asthma issues.  At her evaluation in January 2024 she had lost approximately 60 pounds with initiation of Ozempic  therapy.  Over the past year she has had issues with insurance with continuation of treatment since her hemoglobin A1c has decreased significantly and on January 05, 2023 was 5.7.  She admits to gaining approximately 16 pounds of weight being off Ozempic  and apparently was just given a prescription to try Mounjaro but this has not yet been approved by insurance.  Her blood pressure today is stable and on repeat by me was 120/60.  She states that she is trying to walk every day.  When I saw her in February 2025 she was experiencing a vague sensation of chest tightness when walking up a hill or climbing steps.  At that time, she was bradycardic with heart rate at 42.  Her bisoprolol  dose was discontinued.  I scheduled her for coronary CTA which showed a calcium  score at 49.3 representing 75th percentile.  There was mild plaque in the RCA and LAD less than 25%.  She is now back on Ozempic .  She is no longer on amlodipine  with recent low blood pressure.  Her blood pressure today is stable on her regimen bisoprolol  2.5 mg and spironolactone  12.5 mg daily.  She is on Jardiance  and was recently restarted on Ozempic .  She takes omeprazole  for GERD and bupropion  for depression.  Clinically she is doing well and is followed by Dr. Paulla Bossier for primary care.  I discussed my upcoming retirement.  I will transition her to the care of Dr. Carson Clara with 1 year follow-up evaluation.  Medication Adjustments/Labs and Tests Ordered:  Current medicines are reviewed at length with the patient today.  Concerns regarding medicines are outlined above.  Medication changes, Labs and Tests ordered today are listed in the Patient Instructions below. There  are no Patient Instructions on file for this visit.   Signed, Magnus Schuller, MD  05/12/2023 10:04 AM    [facility] [address], [place], [place]  [address] Phone: [phone]

## 2023-05-12 NOTE — Patient Instructions (Signed)
 Medication Instructions:  NO CHANGES *If you need a refill on your cardiac medications before your next appointment, please call your pharmacy*  Lab Work: NO LABS If you have labs (blood work) drawn today and your tests are completely normal, you will receive your results only by: MyChart Message (if you have MyChart) OR A paper copy in the mail If you have any lab test that is abnormal or we need to change your treatment, we will call you to review the results.  Testing/Procedures: NO TESTING  Follow-Up: At [facility], you and your health needs are our priority.  As part of our continuing mission to provide you with exceptional heart care, our providers are all part of one team.  This team includes your primary Cardiologist (physician) and Advanced Practice Providers or APPs (Physician Assistants and Nurse Practitioners) who all work together to provide you with the care you need, when you need it.  Your next appointment:   1 year(s)  Provider:   Carson Clara, MD

## 2023-05-14 ENCOUNTER — Encounter: Payer: Self-pay | Admitting: Cardiovascular Disease

## 2023-06-15 ENCOUNTER — Other Ambulatory Visit: Payer: Self-pay | Admitting: Family Medicine

## 2023-06-20 ENCOUNTER — Other Ambulatory Visit: Payer: Self-pay | Admitting: Family Medicine

## 2023-06-28 ENCOUNTER — Other Ambulatory Visit: Payer: Self-pay | Admitting: Family Medicine

## 2023-07-01 ENCOUNTER — Other Ambulatory Visit: Payer: Self-pay | Admitting: Family Medicine

## 2023-07-01 NOTE — Telephone Encounter (Signed)
 Requested Prescriptions   Pending Prescriptions Disp Refills   ALPRAZolam  (XANAX ) 1 MG tablet [Pharmacy Med Name: ALPRAZOLAM  1 MG TABLET] 30 tablet     Sig: TAKE 1/2 TABLET BY MOUTH TWICE A DAY AS NEEDED FOR ANXIETY     Date of patient request: 07/01/2023 Last office visit: 05/04/2023 Upcoming visit: 08/09/2023 Date of last refill: 01/21/2023 Last refill amount: 30

## 2023-07-24 ENCOUNTER — Other Ambulatory Visit: Payer: Self-pay | Admitting: Family Medicine

## 2023-08-09 ENCOUNTER — Ambulatory Visit: Admitting: Family Medicine

## 2023-08-21 ENCOUNTER — Other Ambulatory Visit: Payer: Self-pay | Admitting: Family Medicine

## 2023-08-30 ENCOUNTER — Ambulatory Visit: Admitting: Family Medicine

## 2023-08-30 ENCOUNTER — Encounter: Payer: Self-pay | Admitting: Family Medicine

## 2023-08-30 VITALS — BP 130/72 | HR 45 | Temp 98.0°F | Ht 63.0 in | Wt 230.4 lb

## 2023-08-30 DIAGNOSIS — Z7984 Long term (current) use of oral hypoglycemic drugs: Secondary | ICD-10-CM

## 2023-08-30 DIAGNOSIS — Z7985 Long-term (current) use of injectable non-insulin antidiabetic drugs: Secondary | ICD-10-CM

## 2023-08-30 DIAGNOSIS — E119 Type 2 diabetes mellitus without complications: Secondary | ICD-10-CM | POA: Diagnosis not present

## 2023-08-30 LAB — BASIC METABOLIC PANEL WITH GFR
BUN: 16 mg/dL (ref 6–23)
CO2: 25 meq/L (ref 19–32)
Calcium: 9.3 mg/dL (ref 8.4–10.5)
Chloride: 105 meq/L (ref 96–112)
Creatinine, Ser: 0.86 mg/dL (ref 0.40–1.20)
GFR: 71.14 mL/min (ref >60.00)
Glucose, Bld: 79 mg/dL (ref 70–99)
Potassium: 4 meq/L (ref 3.5–5.1)
Sodium: 141 meq/L (ref 135–145)

## 2023-08-30 LAB — HEMOGLOBIN A1C: Hgb A1c MFr Bld: 5.7 % (ref 4.6–6.5)

## 2023-08-30 MED ORDER — OMEPRAZOLE 40 MG PO CPDR: 40.0000 mg | DELAYED_RELEASE_CAPSULE | Freq: Every day | ORAL | 1 refills | Status: AC

## 2023-08-30 NOTE — Patient Instructions (Signed)
 Follow up in 3-4 weeks to recheck BP and heart rate We'll notify you of your lab results and make any changes if needed STOP the Bisoprolol  Continue to work on healthy diet and regular physical activity Call with any questions or concerns Hang in there!

## 2023-08-30 NOTE — Progress Notes (Signed)
   Subjective:    Patient ID: Ariana [name], female    DOB: 08-15-1960, 63 y.o.   MRN: [id number]  HPI DM- chronic problem, on Ozempic  0.5mg  weekly, Jardiance  10mg  daily.  UTD on microalbumin, eye exam.  Due for foot exam.  Pt reports she is not exercising due to fatigue.  No CP.  Denies HA's, visual changes, abd pain, N/V.  No numbness/tingling of hands/feet.  Symptomatic bradycardia- ongoing issue.  Today HR is 45 and she reports feeling badly- fatigue is her biggest complaint.  'i just give out'.  On Bisoprolol  2.5mg  daily.  No CP.  + SOB w/ exertion.   Review of Systems For ROS see HPI     Objective:   Physical Exam Vitals reviewed.  Constitutional:      General: She is not in acute distress.    Appearance: Normal appearance. She is well-developed.  HENT:     Head: Normocephalic and atraumatic.  Eyes:     Conjunctiva/sclera: Conjunctivae normal.     Pupils: Pupils are equal, round, and reactive to light.  Neck:     Thyroid : No thyromegaly.  Cardiovascular:     Rate and Rhythm: Regular rhythm. Bradycardia present.     Pulses: Normal pulses.     Heart sounds: Normal heart sounds. No murmur heard. Pulmonary:     Effort: Pulmonary effort is normal. No respiratory distress.     Breath sounds: Normal breath sounds.  Abdominal:     General: There is no distension.     Palpations: Abdomen is soft.     Tenderness: There is no abdominal tenderness.  Musculoskeletal:     Cervical back: Normal range of motion and neck supple.     Right lower leg: No edema.     Left lower leg: No edema.  Lymphadenopathy:     Cervical: No cervical adenopathy.  Skin:    General: Skin is warm and dry.     Coloration: Skin is pale.  Neurological:     General: No focal deficit present.     Mental Status: She is alert and oriented to person, place, and time.  Psychiatric:        Mood and Affect: Mood normal.        Behavior: Behavior normal.        Thought Content: Thought content normal.            Assessment & Plan:  Symptomatic bradycardia- deteriorated.  Pt reports she initially felt better when beta blocker dose was lowered but she is again symptomatic w/ excessive fatigue and SOB.  Will hold Bisoprolol  entirely and see how she feels and how BP looks.  Pt expressed understanding and is in agreement w/ plan.

## 2023-08-30 NOTE — Assessment & Plan Note (Signed)
 Chronic problem.  On Ozempic  0.5mg  weekly and Jardiance  10mg  daily.  UTD on microalbumin and eye exam.  Foot exam done today.  Currently asymptomatic w/ exception of SOB w/ exertion- likely due to bradycardia.  Check labs.  Adjust meds prn

## 2023-08-31 ENCOUNTER — Ambulatory Visit: Payer: Self-pay | Admitting: Family Medicine

## 2023-09-20 ENCOUNTER — Other Ambulatory Visit (HOSPITAL_COMMUNITY): Payer: Self-pay

## 2023-09-20 ENCOUNTER — Other Ambulatory Visit: Payer: Self-pay | Admitting: Family Medicine

## 2023-09-20 ENCOUNTER — Telehealth: Payer: Self-pay

## 2023-09-20 NOTE — Telephone Encounter (Signed)
 PA needed

## 2023-09-20 NOTE — Telephone Encounter (Signed)
 Pharmacy Patient Advocate Encounter  Received notification from [employer] that Prior Authorization for  Ozempic  (0.25 or 0.5 MG/DOSE) 2MG /3ML pen-injectors  has been APPROVED from 09/20/23 to 01/05/24   PA #/Case ID/Reference #: [id number]

## 2023-09-20 NOTE — Telephone Encounter (Signed)
 Pharmacy Patient Advocate Encounter   Received notification from Pt Calls Messages that prior authorization for Ozempic  (0.25 or 0.5 MG/DOSE) 2MG /3ML pen-injectors is required/requested.   Insurance verification completed.   The patient is insured through [employer] .   Per test claim: PA required; PA submitted to above mentioned insurance via Latent Key/confirmation #/EOC [id number] Status is pending

## 2023-09-27 ENCOUNTER — Ambulatory Visit: Admitting: Family Medicine

## 2023-09-27 ENCOUNTER — Other Ambulatory Visit: Payer: Self-pay | Admitting: Family Medicine

## 2023-09-27 ENCOUNTER — Encounter: Payer: Self-pay | Admitting: Family Medicine

## 2023-09-27 VITALS — BP 102/56 | HR 69 | Temp 98.0°F | Resp 14 | Ht 63.0 in | Wt 231.0 lb

## 2023-09-27 DIAGNOSIS — R001 Bradycardia, unspecified: Secondary | ICD-10-CM

## 2023-09-27 NOTE — Patient Instructions (Signed)
 Follow up in 3 months to recheck sugar, blood pressure, and cholesterol No med changes or labs at this time!  Yay!! We're done w/ Bisoprolol  Call with any questions or concerns Stay Safe!  Stay Healthy! Happy [birth year]ated Birthday!!

## 2023-09-27 NOTE — Progress Notes (Signed)
   Subjective:    Patient ID: Ariana [name], female    DOB: [birth year], 63 y.o.   MRN: [id number]  HPI Symptomatic bradycardia- at last visit we stopped Bisoprolol  entirely.  HR today is 69.  BP remains well controlled.  Pt reports that 'most days she feels ok'.  No CP, SOB, HA's, visual changes, edema.   Review of Systems For ROS see HPI     Objective:   Physical Exam Vitals reviewed.  Constitutional:      General: She is not in acute distress.    Appearance: Normal appearance. She is obese. She is not ill-appearing.  Eyes:     Extraocular Movements: Extraocular movements intact.     Conjunctiva/sclera: Conjunctivae normal.  Cardiovascular:     Rate and Rhythm: Normal rate and regular rhythm.     Heart sounds: Normal heart sounds.  Pulmonary:     Effort: Pulmonary effort is normal. No respiratory distress.     Breath sounds: Normal breath sounds. No wheezing or rhonchi.  Skin:    General: Skin is warm and dry.  Neurological:     General: No focal deficit present.     Mental Status: She is alert and oriented to person, place, and time.  Psychiatric:        Mood and Affect: Mood normal.        Behavior: Behavior normal.        Thought Content: Thought content normal.           Assessment & Plan:  Symptomatic bradycardia- improved since stopping Bisoprolol .  Pt reports feeling better most days and BP is still well controlled.  No med changes at this time.  Will continue to follow.

## 2023-09-30 ENCOUNTER — Telehealth: Payer: Self-pay | Admitting: Cardiology

## 2023-09-30 NOTE — Telephone Encounter (Signed)
 Spoke with patient of Dr. Kate. She has good days and bad days -- 4 of 7 days she feels normal, the other days she has no energy. She said sometimes her heart races, with HRs ranging 40s-60s. PCP had her hold bisoprolol  from 08/30/23 - 09/29/23. Seen by PCP yesterday. She said she has been dealing with symptomatic bradycardia since 12/2021 when she went in for knee surgery. She denies feelings of pre-syncope. She has to stop to catch breath when walking in grocery.   Former patient of Dr. Burnard Has not seen Dr. Kate   Will defer to MD for advice

## 2023-09-30 NOTE — Telephone Encounter (Signed)
 Left message to call back.

## 2023-09-30 NOTE — Telephone Encounter (Signed)
 STAT if HR is under 50 or over 120 (normal HR is 60-100 beats per minute)  What is your heart rate? 40's at her doctors appt last week. Pt does not have a reading for today   Do you have a log of your heart rate readings (document readings)? No  Do you have any other symptoms? No symptoms, pt says sometimes she just feels bad  Pt c/o medication issue:  1. Name of Medication: bisoprolol    2. How are you currently taking this medication (dosage and times per day)? Pt not taking medication  3. Are you having a reaction (difficulty breathing--STAT)? No   4. What is your medication issue? Patients PCP took her off of medication due to low heart rate. She felt better, but heart rate is low again.

## 2023-10-01 NOTE — Telephone Encounter (Signed)
 Agree with holding bisoprolol .  Can we schedule her an appointment with me or APP for sometime in next few months?

## 2023-10-04 NOTE — Telephone Encounter (Signed)
 Patient called and appointment scheduled with provider as requested by Dr. Kate. Patient agrees to continue to hold Bisoprolol  as ordered by PCP. Per patient she will purchase a [employer]  blood pressure cuff and advised to continue to check blood pressure and keep log. Understanding verbalized,

## 2023-11-07 NOTE — Progress Notes (Unsigned)
 Cardiology Office Note:    Date:  11/10/2023   ID:  Ariana [name], DOB Apr 04, 1960, MRN [id number]  PCP:  Mahlon Comer BRAVO, MD  Cardiologist:  Debby Sor, MD (Inactive)  Electrophysiologist:  None   Referring MD: Mahlon Comer BRAVO, MD   Chief Complaint  Patient presents with   Shortness of Breath    History of Present Illness:    Ariana [name] is a 63 y.o. female with a hx of hypertension, hyperlipidemia, GERD, T2DM who presents for follow-up.  Previously followed with Dr. Sor.  Echocardiogram in 2019 showed EF 60 to 65%, G2DD.  Coronary CTA 03/02/2023 showed minimal stenosis in LAD and RCA, calcium  score 49 (75th percentile).  Since last clinic visit, she reports has been having episodes of lightheadedness that correspond to her heart rate being low.  She brought her home BP log, overall appears well-controlled but will occasionally have heart rate down to 40s.  She stopped taking her bisoprolol  in December.  States that she also feels tired.  Denies any syncope.  Also reports occasional chest pain and shortness of breath with exertion.  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   ALPRAZolam  (XANAX ) 1 MG tablet TAKE 1/2 TABLET BY MOUTH TWICE A DAY AS NEEDED FOR ANXIETY   buPROPion  (WELLBUTRIN  XL) 300 MG 24 hr tablet TAKE 1 TABLET BY MOUTH EVERY DAY   JARDIANCE  10 MG TABS tablet TAKE 1 TABLET BY MOUTH EVERY DAY   omeprazole  (PRILOSEC) 40 MG capsule Take 1 capsule (40 mg total) by mouth daily.   rosuvastatin  (CRESTOR ) 20 MG tablet TAKE 1 TABLET BY MOUTH EVERY DAY   Semaglutide ,0.25 or 0.5MG /DOS, (OZEMPIC , 0.25 OR 0.5 MG/DOSE,) 2 MG/3ML SOPN INJECT 0.5 MG INTO THE SKIN ONE TIME PER WEEK   spironolactone  (ALDACTONE ) 25 MG tablet TAKE 1/2 TABLET TWICE A DAY BY MOUTH     Allergies:   Penicillins   Social History   Socioeconomic History   Marital status: Divorced    Spouse name: Not on file   Number of children: Not on file   Years of education: Not on file   Highest education level: Associate degree: occupational, scientist, product/process development, or vocational program  Occupational History   Not on file  Tobacco Use  Smoking status: Former    Current packs/day: 0.00    Types: Cigarettes    Quit date: 01/18/1981    Years since quitting: 42.8   Smokeless tobacco: Never   Tobacco comments:    pt states she smoker for 3 years in high school (10/28/17)  Vaping Use   Vaping status: Never Used  Substance and Sexual Activity   Alcohol  use: No   Drug use: No   Sexual activity: Never  Other Topics Concern   Not on file  Social History Narrative   Not on file   Social Drivers of Health   Financial Resource Strain: Low Risk  (08/27/2023)   Overall Financial Resource Strain (CARDIA)    Difficulty of Paying Living Expenses: Not hard at all  Food Insecurity: No Food Insecurity (08/27/2023)   Hunger Vital Sign     Worried About Running Out of Food in the Last Year: Never true    Ran Out of Food in the Last Year: Never true  Transportation Needs: No Transportation Needs (08/27/2023)   PRAPARE - Transportation    Lack of Transportation (Medical): No    Lack of Transportation (Non-Medical): No  Physical Activity: Insufficiently Active (08/27/2023)   Exercise Vital Sign    Days of Exercise per Week: 2 days    Minutes of Exercise per Session: 20 min  Stress: Stress Concern Present (08/27/2023)   [employer] of Occupational Health - Occupational Stress Questionnaire    Feeling of Stress: To some extent  Social Connections: Moderately Isolated (08/27/2023)   Social Connection and Isolation Panel    Frequency of Communication with Friends and Family: Once a week    Frequency of Social Gatherings with Friends and Family: Once a week    Attends Religious Services: More than 4 times per year    Active Member of [employer] or Organizations: Yes    Attends Engineer, Structural: 1 to 4 times per year    Marital Status: Divorced     Family History: The patient's family history includes Arthritis in her brother and father; Cancer in her maternal aunt; Dementia in her mother; Diabetes in her brother; Heart attack in her father; Heart disease in her brother and father; Hyperlipidemia in her father; Hypertension in her father; Liver disease in her brother.  ROS:   Please see the history of present illness.     All other systems reviewed and are negative.  EKGs/Labs/Other Studies Reviewed:    The following studies were reviewed today:   EKG:   11/10/2023: Normal sinus rhythm, rate 84, no ST abnormalities  Recent Labs: 04/05/2023: ALT 16; Hemoglobin 13.8; Platelets 239.0; TSH 1.37 08/30/2023: BUN 16; Creatinine, Ser 0.86; Potassium 4.0; Sodium 141  Recent Lipid Panel    Component Value Date/Time   CHOL 112 04/05/2023 1020   TRIG 134.0 04/05/2023 1020   HDL 40.50 04/05/2023 1020   CHOLHDL 3  04/05/2023 1020   VLDL 26.8 04/05/2023 1020   LDLCALC 44 04/05/2023 1020   LDLDIRECT 76.0 01/30/2020 1348    Physical Exam:    VS:  BP 128/76 (BP Location: Right Arm, Patient Position: Sitting, Cuff Size: Normal)   Pulse 84   Ht 5' 3 (1.6 m)   Wt 231 lb 4.8 oz (104.9 kg)   SpO2 93%   BMI 40.97 kg/m     Wt Readings from Last 3 Encounters:  11/10/23 231 lb 4.8 oz (104.9 kg)  09/27/23 231 lb (104.8 kg)  08/30/23 230 lb 6 oz (104.5  kg)     GEN:  Well nourished, well developed in no acute distress HEENT: Normal NECK: No JVD; No carotid bruits LYMPHATICS: No lymphadenopathy CARDIAC: RRR, no murmurs, rubs, gallops RESPIRATORY:  Clear to auscultation without rales, wheezing or rhonchi  ABDOMEN: Soft, non-tender, non-distended MUSCULOSKELETAL:  No edema; No deformity  SKIN: Warm and dry NEUROLOGIC:  Alert and oriented x 3 PSYCHIATRIC:  Normal affect   ASSESSMENT:    1. Bradycardia   2. Coronary artery disease involving native coronary artery of native heart, unspecified whether angina present   3. DOE (dyspnea on exertion)   4. Essential hypertension   5. Hyperlipidemia, unspecified hyperlipidemia type   6. Morbid obesity (HCC)   7. Snoring   8. Daytime somnolence    PLAN:    Bradycardia: Reports intermittent lightheadedness that corresponds to when her heart rate is low.  On home BP log, has intermittent heart rates down to 40s.  Recommend Zio patch x 2 weeks for further evaluation  Dyspnea on exertion: Will update echocardiogram  CAD: Echocardiogram in 2019 showed EF 60 to 65%, G2DD.  Coronary CTA 03/02/2023 showed minimal stenosis in LAD and RCA, calcium  score 49 (75th percentile). - Continue rosuvastatin  20 mg daily  Hypertension: On spironolactone  12.5 mg twice daily.  Previously on bisoprolol  but held given bradycardia  T2DM: On Jardiance . A1c 5.7% on 08/30/23  Hyperlipidemia: On rosuvastatin  20 mg daily.  LDL 44 on 04/05/23  Morbid obesity: Body mass index is  40.97 kg/m.  Has lost 50 lbs.  On Ozempic   Snoring/daytime somnolence. Check Itamar sleep study.  STOP-BANG 4  RTC in 4 months   Medication Adjustments/Labs and Tests Ordered: Current medicines are reviewed at length with the patient today.  Concerns regarding medicines are outlined above.  Orders Placed This Encounter  Procedures   LONG TERM MONITOR (3-14 DAYS)   EKG 12-Lead   ECHOCARDIOGRAM COMPLETE   Itamar Sleep Study   No orders of the defined types were placed in this encounter.   Patient Instructions  Medication Instructions:  Your physician recommends that you continue on your current medications as directed. Please refer to the Current Medication list given to you today.  *If you need a refill on your cardiac medications before your next appointment, please call your pharmacy*  Lab Work: None ordered.  If you have labs (blood work) drawn today and your tests are completely normal, you will receive your results only by: MyChart Message (if you have MyChart) OR A paper copy in the mail If you have any lab test that is abnormal or we need to change your treatment, we will call you to review the results.  Testing/Procedures: Your physician has requested that you have an echocardiogram. Echocardiography is a painless test that uses sound waves to create images of your heart. It provides your doctor with information about the size and shape of your heart and how well your heart's chambers and valves are working. This procedure takes approximately one hour. There are no restrictions for this procedure. Please do NOT wear cologne, perfume, aftershave, or lotions (deodorant is allowed). Please arrive 15 minutes prior to your appointment time.  Please note: We ask at that you not bring children with you during ultrasound (echo/ vascular) testing. Due to room size and safety concerns, children are not allowed in the ultrasound rooms during exams. Our front office staff cannot  provide observation of children in our lobby area while testing is being conducted. An adult accompanying a patient to  their appointment will only be allowed in the ultrasound room at the discretion of the ultrasound technician under special circumstances. We apologize for any inconvenience.  Your physician has recommended that you have a sleep study. This test records several body functions during sleep, including: brain activity, eye movement, oxygen and carbon dioxide blood levels, heart rate and rhythm, breathing rate and rhythm, the flow of air through your mouth and nose, snoring, body muscle movements, and chest and belly movement.    Follow-Up: At [facility], you and your health needs are our priority.  As part of our continuing mission to provide you with exceptional heart care, our providers are all part of one team.  This team includes your primary Cardiologist (physician) and Advanced Practice Providers or APPs (Physician Assistants and Nurse Practitioners) who all work together to provide you with the care you need, when you need it.  Your next appointment:   4 months with Dr Kate  We recommend signing up for the patient portal called MyChart.  Sign up information is provided on this After Visit Summary.  MyChart is used to connect with patients for Virtual Visits (Telemedicine).  Patients are able to view lab/test results, encounter notes, upcoming appointments, etc.  Non-urgent messages can be sent to your provider as well.   To learn more about what you can do with MyChart, go to [email or web address].   Other Instructions ZIO XT- Long Term Monitor Instructions  Your physician has requested you wear a ZIO patch monitor for 14 days.  This is a single patch monitor. [employer] supplies one patch monitor per enrollment. Additional stickers are not available. Please do not apply patch if you will be having a Nuclear Stress Test,  Echocardiogram, Cardiac CT, MRI, or  Chest Xray during the period you would be wearing the  monitor. The patch cannot be worn during these tests. You cannot remove and re-apply the  ZIO XT patch monitor.  Your ZIO patch monitor will be mailed 3 day USPS to your address on file. It may take 3-5 days  to receive your monitor after you have been enrolled.  Once you have received your monitor, please review the enclosed instructions. Your monitor  has already been registered assigning a specific monitor serial # to you.  Billing and Patient Assistance Program Information  We have supplied [employer] with any of your insurance information on file for billing purposes. [employer] offers a sliding scale Patient Assistance Program for patients that do not have  insurance, or whose insurance does not completely cover the cost of the ZIO monitor.  You must apply for the Patient Assistance Program to qualify for this discounted rate.  To apply, please call [employer] at [phone], select option 4, select option 2, ask to apply for  Patient Assistance Program. Meredeth will ask your household income, and how many people  are in your household. They will quote your out-of-pocket cost based on that information.  [employer] will also be able to set up a 60-month, interest-free payment plan if needed.  Applying the monitor   Shave hair from upper left chest.  Hold abrader disc by orange tab. Rub abrader in 40 strokes over the upper left chest as  indicated in your monitor instructions.  Clean area with 4 enclosed alcohol  pads. Let dry.  Apply patch as indicated in monitor instructions. Patch will be placed under collarbone on left  side of chest with arrow pointing upward.  Rub patch adhesive wings for 2 minutes. Remove  white label marked 1. Remove the white  label marked 2. Rub patch adhesive wings for 2 additional minutes.  While looking in a mirror, press and release button in center of patch. A small green light will  flash 3-4 times. This  will be your only indicator that the monitor has been turned on.  Do not shower for the first 24 hours. You may shower after the first 24 hours.  Press the button if you feel a symptom. You will hear a small click. Record Date, Time and  Symptom in the Patient Logbook.  When you are ready to remove the patch, follow instructions on the last 2 pages of Patient  Logbook. Stick patch monitor onto the last page of Patient Logbook.  Place Patient Logbook in the blue and white box. Use locking tab on box and tape box closed  securely. The blue and white box has prepaid postage on it. Please place it in the mailbox as  soon as possible. Your physician should have your test results approximately 7 days after the  monitor has been mailed back to [facility].  Call [facility] Customer Care at [phone] if you have questions regarding  your ZIO XT patch monitor. Call them immediately if you see an orange light blinking on your  monitor.  If your monitor falls off in less than 4 days, contact our Monitor department at [phone].  If your monitor becomes loose or falls off after 4 days call [employer] at [phone] for  suggestions on securing your monitor             Signed, Lonni LITTIE Nanas, MD  11/10/2023 5:29 PM    [facility]

## 2023-11-10 ENCOUNTER — Ambulatory Visit: Attending: Cardiology | Admitting: Cardiology

## 2023-11-10 ENCOUNTER — Ambulatory Visit

## 2023-11-10 ENCOUNTER — Encounter: Payer: Self-pay | Admitting: Cardiology

## 2023-11-10 VITALS — BP 128/76 | HR 84 | Ht 63.0 in | Wt 231.3 lb

## 2023-11-10 DIAGNOSIS — R400 Somnolence: Secondary | ICD-10-CM | POA: Diagnosis not present

## 2023-11-10 DIAGNOSIS — E785 Hyperlipidemia, unspecified: Secondary | ICD-10-CM | POA: Diagnosis not present

## 2023-11-10 DIAGNOSIS — R0683 Snoring: Secondary | ICD-10-CM

## 2023-11-10 DIAGNOSIS — R001 Bradycardia, unspecified: Secondary | ICD-10-CM | POA: Diagnosis not present

## 2023-11-10 DIAGNOSIS — R0609 Other forms of dyspnea: Secondary | ICD-10-CM | POA: Diagnosis not present

## 2023-11-10 DIAGNOSIS — I10 Essential (primary) hypertension: Secondary | ICD-10-CM

## 2023-11-10 DIAGNOSIS — I2510 Atherosclerotic heart disease of native coronary artery without angina pectoris: Secondary | ICD-10-CM

## 2023-11-10 NOTE — Patient Instructions (Signed)
 Medication Instructions:  Your physician recommends that you continue on your current medications as directed. Please refer to the Current Medication list given to you today.  *If you need a refill on your cardiac medications before your next appointment, please call your pharmacy*  Lab Work: None ordered.  If you have labs (blood work) drawn today and your tests are completely normal, you will receive your results only by: MyChart Message (if you have MyChart) OR A paper copy in the mail If you have any lab test that is abnormal or we need to change your treatment, we will call you to review the results.  Testing/Procedures: Your physician has requested that you have an echocardiogram. Echocardiography is a painless test that uses sound waves to create images of your heart. It provides your doctor with information about the size and shape of your heart and how well your heart's chambers and valves are working. This procedure takes approximately one hour. There are no restrictions for this procedure. Please do NOT wear cologne, perfume, aftershave, or lotions (deodorant is allowed). Please arrive 15 minutes prior to your appointment time.  Please note: We ask at that you not bring children with you during ultrasound (echo/ vascular) testing. Due to room size and safety concerns, children are not allowed in the ultrasound rooms during exams. Our front office staff cannot provide observation of children in our lobby area while testing is being conducted. An adult accompanying a patient to their appointment will only be allowed in the ultrasound room at the discretion of the ultrasound technician under special circumstances. We apologize for any inconvenience.  Your physician has recommended that you have a sleep study. This test records several body functions during sleep, including: brain activity, eye movement, oxygen and carbon dioxide blood levels, heart rate and rhythm, breathing rate and  rhythm, the flow of air through your mouth and nose, snoring, body muscle movements, and chest and belly movement.    Follow-Up: At [facility], you and your health needs are our priority.  As part of our continuing mission to provide you with exceptional heart care, our providers are all part of one team.  This team includes your primary Cardiologist (physician) and Advanced Practice Providers or APPs (Physician Assistants and Nurse Practitioners) who all work together to provide you with the care you need, when you need it.  Your next appointment:   4 months with Dr Kate  We recommend signing up for the patient portal called MyChart.  Sign up information is provided on this After Visit Summary.  MyChart is used to connect with patients for Virtual Visits (Telemedicine).  Patients are able to view lab/test results, encounter notes, upcoming appointments, etc.  Non-urgent messages can be sent to your provider as well.   To learn more about what you can do with MyChart, go to [email or web address].   Other Instructions ZIO XT- Long Term Monitor Instructions  Your physician has requested you wear a ZIO patch monitor for 14 days.  This is a single patch monitor. [employer] supplies one patch monitor per enrollment. Additional stickers are not available. Please do not apply patch if you will be having a Nuclear Stress Test,  Echocardiogram, Cardiac CT, MRI, or Chest Xray during the period you would be wearing the  monitor. The patch cannot be worn during these tests. You cannot remove and re-apply the  ZIO XT patch monitor.  Your ZIO patch monitor will be mailed 3 day USPS to your address on file.  It may take 3-5 days  to receive your monitor after you have been enrolled.  Once you have received your monitor, please review the enclosed instructions. Your monitor  has already been registered assigning a specific monitor serial # to you.  Billing and Patient Assistance Program  Information  We have supplied [employer] with any of your insurance information on file for billing purposes. [employer] offers a sliding scale Patient Assistance Program for patients that do not have  insurance, or whose insurance does not completely cover the cost of the ZIO monitor.  You must apply for the Patient Assistance Program to qualify for this discounted rate.  To apply, please call [employer] at [phone], select option 4, select option 2, ask to apply for  Patient Assistance Program. Meredeth will ask your household income, and how many people  are in your household. They will quote your out-of-pocket cost based on that information.  [employer] will also be able to set up a 8-month, interest-free payment plan if needed.  Applying the monitor   Shave hair from upper left chest.  Hold abrader disc by orange tab. Rub abrader in 40 strokes over the upper left chest as  indicated in your monitor instructions.  Clean area with 4 enclosed alcohol  pads. Let dry.  Apply patch as indicated in monitor instructions. Patch will be placed under collarbone on left  side of chest with arrow pointing upward.  Rub patch adhesive wings for 2 minutes. Remove white label marked 1. Remove the white  label marked 2. Rub patch adhesive wings for 2 additional minutes.  While looking in a mirror, press and release button in center of patch. A small green light will  flash 3-4 times. This will be your only indicator that the monitor has been turned on.  Do not shower for the first 24 hours. You may shower after the first 24 hours.  Press the button if you feel a symptom. You will hear a small click. Record Date, Time and  Symptom in the Patient Logbook.  When you are ready to remove the patch, follow instructions on the last 2 pages of Patient  Logbook. Stick patch monitor onto the last page of Patient Logbook.  Place Patient Logbook in the blue and white box. Use locking tab on box and tape box closed   securely. The blue and white box has prepaid postage on it. Please place it in the mailbox as  soon as possible. Your physician should have your test results approximately 7 days after the  monitor has been mailed back to [facility].  Call [facility] Customer Care at [phone] if you have questions regarding  your ZIO XT patch monitor. Call them immediately if you see an orange light blinking on your  monitor.  If your monitor falls off in less than 4 days, contact our Monitor department at [phone].  If your monitor becomes loose or falls off after 4 days call [employer] at [phone] for  suggestions on securing your monitor

## 2023-11-10 NOTE — Progress Notes (Unsigned)
 Enrolled patient for a 14 day Zio XT  monitor to be mailed to patients home

## 2023-11-18 ENCOUNTER — Other Ambulatory Visit: Payer: Self-pay | Admitting: Family Medicine

## 2023-11-19 NOTE — Telephone Encounter (Signed)
 Requesting: alprazolam  1mg  Contract: UDS: Last Visit: 09/27/23  Next Visit:12/27/23 Last Refill: 07/01/23 #30 and 3rf  Please Advise

## 2023-11-21 ENCOUNTER — Other Ambulatory Visit: Payer: Self-pay | Admitting: Family Medicine

## 2023-11-26 ENCOUNTER — Encounter: Payer: Self-pay | Admitting: Cardiology

## 2023-11-26 DIAGNOSIS — R0683 Snoring: Secondary | ICD-10-CM | POA: Diagnosis not present

## 2023-11-26 DIAGNOSIS — G4710 Hypersomnia, unspecified: Secondary | ICD-10-CM | POA: Diagnosis not present

## 2023-11-26 DIAGNOSIS — G478 Other sleep disorders: Secondary | ICD-10-CM | POA: Diagnosis not present

## 2023-11-26 DIAGNOSIS — R030 Elevated blood-pressure reading, without diagnosis of hypertension: Secondary | ICD-10-CM | POA: Diagnosis not present

## 2023-11-26 DIAGNOSIS — R5382 Chronic fatigue, unspecified: Secondary | ICD-10-CM | POA: Diagnosis not present

## 2023-11-29 ENCOUNTER — Ambulatory Visit: Attending: Cardiology

## 2023-11-29 DIAGNOSIS — R0683 Snoring: Secondary | ICD-10-CM

## 2023-11-29 DIAGNOSIS — R400 Somnolence: Secondary | ICD-10-CM

## 2023-11-29 NOTE — Procedures (Signed)
    SLEEP STUDY REPORT Patient Information Study Date: 11/26/2023 Patient Name: Ariana [name] Patient ID: [id number] Birth Date: 1960-12-05 Age: 63 Gender: Female BMI: 41.0 (W=231 lb, H=5' 3'') Referring Physician: Lonni Nanas, MD  TEST DESCRIPTION: Home sleep apnea testing was completed using the WatchPat, a Type 1 device, utilizing  peripheral arterial tonometry (PAT), chest movement, actigraphy, pulse oximetry, pulse rate, body position and snore.  AHI was calculated with apnea and hypopnea using valid sleep time as the denominator. RDI includes apneas,  hypopneas, and RERAs. The data acquired and the scoring of sleep and all associated events were performed in  accordance with the recommended standards and specifications as outlined in the AASM Manual for the Scoring of  Sleep and Associated Events 2.2.0 (2015).   FINDINGS: 1. No evidence of Obstructive Sleep Apnea with AHI 2.3/hr.  2. No Central Sleep Apnea. 3. Oxygen desaturations as low as 89%. 4. Severe snoring was present. O2 sats were < 88% for 0 minutes. 5. Total sleep time was 3 hrs and 53 min. 6. 11.6% of total sleep time was spent in REM sleep.  7. Normal sleep onset latency at 10 min.  8. Shortened REM sleep onset latency at 83 min.  9. Total awakenings were 3.   DIAGNOSIS:  Nondiagnostic due to poor PAT tracing.  RECOMMENDATIONS: Recommend in lab NPSG.  Signature: Wilbert Bihari, MD; [facility]; Diplomat, American Board of Sleep  Medicine Electronically Signed: 11/29/2023 4:48:22 P

## 2023-12-07 ENCOUNTER — Ambulatory Visit: Payer: Self-pay | Admitting: Cardiology

## 2023-12-07 ENCOUNTER — Ambulatory Visit (HOSPITAL_COMMUNITY)
Admission: RE | Admit: 2023-12-07 | Discharge: 2023-12-07 | Disposition: A | Source: Ambulatory Visit | Attending: Cardiology

## 2023-12-07 DIAGNOSIS — R001 Bradycardia, unspecified: Secondary | ICD-10-CM

## 2023-12-07 DIAGNOSIS — R0609 Other forms of dyspnea: Secondary | ICD-10-CM | POA: Insufficient documentation

## 2023-12-07 LAB — ECHOCARDIOGRAM COMPLETE
AR max vel: 2.16 cm2
AV Area VTI: 2.09 cm2
AV Area mean vel: 2.09 cm2
AV Mean grad: 4 mmHg
AV Peak grad: 8.6 mmHg
Ao pk vel: 1.47 m/s
Area-P 1/2: 3.02 cm2
MV M vel: 1.08 m/s
MV Peak grad: 4.7 mmHg
S' Lateral: 3.3 cm

## 2023-12-08 DIAGNOSIS — R001 Bradycardia, unspecified: Secondary | ICD-10-CM | POA: Diagnosis not present

## 2023-12-13 ENCOUNTER — Other Ambulatory Visit (HOSPITAL_COMMUNITY): Payer: Self-pay

## 2023-12-16 ENCOUNTER — Telehealth: Payer: Self-pay | Admitting: *Deleted

## 2023-12-16 DIAGNOSIS — R400 Somnolence: Secondary | ICD-10-CM

## 2023-12-16 DIAGNOSIS — I10 Essential (primary) hypertension: Secondary | ICD-10-CM

## 2023-12-16 DIAGNOSIS — I2510 Atherosclerotic heart disease of native coronary artery without angina pectoris: Secondary | ICD-10-CM

## 2023-12-16 DIAGNOSIS — R0681 Apnea, not elsewhere classified: Secondary | ICD-10-CM

## 2023-12-16 DIAGNOSIS — R0683 Snoring: Secondary | ICD-10-CM

## 2023-12-16 NOTE — Telephone Encounter (Signed)
-----   Message from Wilbert Bihari, MD sent at 11/29/2023  4:49 PM EST ----- Nondiagnostic sleep study due to poor PAT tracing.  Please set up for in-lab NPSG

## 2023-12-16 NOTE — Telephone Encounter (Signed)
 The patient has been notified of the result via her mychart.

## 2023-12-17 ENCOUNTER — Encounter: Payer: Self-pay | Admitting: Pharmacist

## 2023-12-17 NOTE — Telephone Encounter (Signed)
 Return Call: The patient has been notified of the result and verbalized understanding.  All questions (if any) were answered. Joshua Dalton Seip, CMA 12/17/2023 8:35 AM     Precert NPSG

## 2023-12-17 NOTE — Progress Notes (Signed)
 Pharmacy Quality Measure Review  This patient is appearing on a report for being at risk of failing the adherence measure for cholesterol (statin) and diabetes medications this calendar year.   Medication: Jardiance  10mg  Last fill date: 10/29/2023 for 30 day supply per adherence report  Medication: rosuvastatin  Last fill date: 09/11/2023 for 90 day supply per adherence report  Reviewed recent refill history in Dr Annemarie database. Actual last refill date was 11/28/2023 for 30 day supply for Jardiance  and 12/07/2023 for 90 day supply for rosuvastatin .  Patient has 4 refills remaining on Jardiance  and no refills remaining for rosuvastatin . Next appointment with PCP is 12/27/2023.    Insurance report was not up to date. No action needed at this time.   Madelin Ray, PharmD Clinical Pharmacist [facility]  Population Health [phone]

## 2023-12-17 NOTE — Addendum Note (Signed)
 Addended by: JOSHUA DALTON MATSU on: 12/17/2023 08:39 AM   Modules accepted: Orders

## 2023-12-20 ENCOUNTER — Telehealth: Payer: Self-pay

## 2023-12-20 NOTE — Telephone Encounter (Signed)
 Pharmacy Patient Advocate Encounter   Received notification from CoverMyMeds that prior authorization for Ozempic  (0.25 or 0.5 MG/DOSE) 2MG /3ML pen-injectors  is required/requested.   Insurance verification completed.   The patient is insured through [employer].   Per test claim: PA active on file 10-06-2023 to 01-04-2025

## 2023-12-27 ENCOUNTER — Ambulatory Visit: Admitting: Family Medicine

## 2023-12-27 ENCOUNTER — Encounter: Payer: Self-pay | Admitting: Family Medicine

## 2023-12-27 ENCOUNTER — Ambulatory Visit: Payer: Self-pay | Admitting: Family Medicine

## 2023-12-27 VITALS — BP 112/68 | HR 68 | Temp 98.0°F | Resp 16 | Ht 63.0 in | Wt 232.2 lb

## 2023-12-27 DIAGNOSIS — E785 Hyperlipidemia, unspecified: Secondary | ICD-10-CM | POA: Diagnosis not present

## 2023-12-27 DIAGNOSIS — Z7985 Long-term (current) use of injectable non-insulin antidiabetic drugs: Secondary | ICD-10-CM | POA: Diagnosis not present

## 2023-12-27 DIAGNOSIS — Z7984 Long term (current) use of oral hypoglycemic drugs: Secondary | ICD-10-CM | POA: Diagnosis not present

## 2023-12-27 DIAGNOSIS — E119 Type 2 diabetes mellitus without complications: Secondary | ICD-10-CM

## 2023-12-27 DIAGNOSIS — I5032 Chronic diastolic (congestive) heart failure: Secondary | ICD-10-CM | POA: Diagnosis not present

## 2023-12-27 DIAGNOSIS — E1169 Type 2 diabetes mellitus with other specified complication: Secondary | ICD-10-CM | POA: Diagnosis not present

## 2023-12-27 LAB — HEPATIC FUNCTION PANEL
ALT: 15 U/L (ref 3–35)
AST: 16 U/L (ref 5–37)
Albumin: 4.2 g/dL (ref 3.5–5.2)
Alkaline Phosphatase: 81 U/L (ref 39–117)
Bilirubin, Direct: 0.1 mg/dL (ref 0.1–0.3)
Total Bilirubin: 0.4 mg/dL (ref 0.2–1.2)
Total Protein: 7 g/dL (ref 6.0–8.3)

## 2023-12-27 LAB — LIPID PANEL
Cholesterol: 113 mg/dL (ref 28–200)
HDL: 34.3 mg/dL — ABNORMAL LOW
LDL Cholesterol: 45 mg/dL (ref 10–99)
NonHDL: 78.23
Total CHOL/HDL Ratio: 3
Triglycerides: 168 mg/dL — ABNORMAL HIGH (ref 10.0–149.0)
VLDL: 33.6 mg/dL (ref 0.0–40.0)

## 2023-12-27 LAB — CBC WITH DIFFERENTIAL/PLATELET
Basophils Absolute: 0 K/uL (ref 0.0–0.1)
Basophils Relative: 0.1 % (ref 0.0–3.0)
Eosinophils Absolute: 0 K/uL (ref 0.0–0.7)
Eosinophils Relative: 0 % (ref 0.0–5.0)
HCT: 39.7 % (ref 36.0–46.0)
Hemoglobin: 12.9 g/dL (ref 12.0–15.0)
Lymphocytes Relative: 16 % (ref 12.0–46.0)
Lymphs Abs: 1.3 K/uL (ref 0.7–4.0)
MCHC: 32.6 g/dL (ref 30.0–36.0)
MCV: 80.6 fl (ref 78.0–100.0)
Monocytes Absolute: 0.5 K/uL (ref 0.1–1.0)
Monocytes Relative: 6.5 % (ref 3.0–12.0)
Neutro Abs: 6.2 K/uL (ref 1.4–7.7)
Neutrophils Relative %: 77.4 % — ABNORMAL HIGH (ref 43.0–77.0)
Platelets: 197 K/uL (ref 150.0–400.0)
RBC: 4.92 Mil/uL (ref 3.87–5.11)
RDW: 17.1 % — ABNORMAL HIGH (ref 11.5–15.5)
WBC: 8 K/uL (ref 4.0–10.5)

## 2023-12-27 LAB — BASIC METABOLIC PANEL WITH GFR
BUN: 17 mg/dL (ref 6–23)
CO2: 25 meq/L (ref 19–32)
Calcium: 9.2 mg/dL (ref 8.4–10.5)
Chloride: 106 meq/L (ref 96–112)
Creatinine, Ser: 0.79 mg/dL (ref 0.40–1.20)
GFR: 78.59 mL/min
Glucose, Bld: 70 mg/dL (ref 70–99)
Potassium: 4.2 meq/L (ref 3.5–5.1)
Sodium: 140 meq/L (ref 135–145)

## 2023-12-27 LAB — TSH: TSH: 1.86 u[IU]/mL (ref 0.35–5.50)

## 2023-12-27 LAB — HEMOGLOBIN A1C: Hgb A1c MFr Bld: 5.4 % (ref 4.6–6.5)

## 2023-12-27 NOTE — Progress Notes (Signed)
" ° °  Subjective:    Patient ID: Ariana [name], female    DOB: 10/12/1960, 63 y.o.   MRN: [id number]  HPI DM- chronic problem, on Jardiance  10mg  daily, Semaglutide  0.5mg  weekly.  UTD on eye exam, foot exam, microalbumin.  Denies CP, SOB above baseline, HA's, visual changes.  No numbness/tingling of hands/feet.  Diastolic heart failure- chronic problem, on Spironolactone  12.5mg  BID w/ good control.  On Jardiance  10mg  daily.  Follows w/ Cards.  No edema.  Hyperlipidemia- chronic problem, on Crestor  20mg  daily.  Denies abd pain, N/V.   Review of Systems For ROS see HPI     Objective:   Physical Exam Vitals reviewed.  Constitutional:      General: She is not in acute distress.    Appearance: Normal appearance. She is well-developed. She is obese. She is not ill-appearing.  HENT:     Head: Normocephalic and atraumatic.  Eyes:     Conjunctiva/sclera: Conjunctivae normal.     Pupils: Pupils are equal, round, and reactive to light.  Neck:     Thyroid : No thyromegaly.  Cardiovascular:     Rate and Rhythm: Normal rate and regular rhythm.     Pulses: Normal pulses.     Heart sounds: Normal heart sounds. No murmur heard. Pulmonary:     Effort: Pulmonary effort is normal. No respiratory distress.     Breath sounds: Normal breath sounds.  Abdominal:     General: There is no distension.     Palpations: Abdomen is soft.     Tenderness: There is no abdominal tenderness.  Musculoskeletal:     Cervical back: Normal range of motion and neck supple.     Right lower leg: No edema.     Left lower leg: No edema.  Lymphadenopathy:     Cervical: No cervical adenopathy.  Skin:    General: Skin is warm and dry.  Neurological:     General: No focal deficit present.     Mental Status: She is alert and oriented to person, place, and time.  Psychiatric:        Mood and Affect: Mood normal.        Behavior: Behavior normal.        Thought Content: Thought content normal.            Assessment & Plan:    "

## 2023-12-27 NOTE — Assessment & Plan Note (Signed)
 Chronic problem.  On Crestor 20mg  daily w/o difficulty.  Check labs.  Adjust meds prn

## 2023-12-27 NOTE — Assessment & Plan Note (Signed)
 Chronic problem.  Following w/ Cardiology.  Currently on Spironolactone  and Jardiance .  Currently asymptomatic.  Check labs due to diuretic use but no anticipated med changes.

## 2023-12-27 NOTE — Patient Instructions (Signed)
 Follow up in 3-4 months to recheck sugar We'll notify you of your lab results and make any changes if needed Continue to work on healthy diet and regular physical activity Call with any questions or concerns Stay Safe!  Stay Healthy! Happy Holidays!!!

## 2023-12-27 NOTE — Assessment & Plan Note (Signed)
 Chronic problem.  On Jardiance  and Semaglutide  w/o difficulty.  UTD on eye exam, foot exam, and microalbumin.  Check labs.  Adjust meds prn

## 2023-12-28 DIAGNOSIS — R001 Bradycardia, unspecified: Secondary | ICD-10-CM | POA: Diagnosis not present

## 2024-01-04 ENCOUNTER — Encounter: Payer: Self-pay | Admitting: Cardiology

## 2024-01-04 ENCOUNTER — Ambulatory Visit: Attending: Cardiology | Admitting: Cardiology

## 2024-01-04 VITALS — BP 132/80 | HR 78 | Ht 63.0 in | Wt 233.6 lb

## 2024-01-04 DIAGNOSIS — R001 Bradycardia, unspecified: Secondary | ICD-10-CM

## 2024-01-04 DIAGNOSIS — I498 Other specified cardiac arrhythmias: Secondary | ICD-10-CM | POA: Diagnosis not present

## 2024-01-04 NOTE — Progress Notes (Unsigned)
" °  Electrophysiology Office Note:   Date:  01/04/2024  ID:  Ariana [name], DOB August 25, 1960, MRN [id number]  Primary Cardiologist: Debby Sor, MD (Inactive) Electrophysiologist: Fonda Kitty, MD  {Click to update primary MD,subspecialty MD or APP then REFRESH:1}    History of Present Illness:   Ariana [name] is a 63 y.o. female with h/o hypertension, hyperlipidemia, GERD, T2DM who is being seen today for ***  Discussed the use of AI scribe software for clinical note transcription with the patient, who gave verbal consent to proceed.  History of Present Illness     Review of systems complete and found to be negative unless listed in HPI.   EP Information / Studies Reviewed:    {EKGtoday:28818}      ***  Risk Assessment/Calculations:   {Does this patient have ATRIAL FIBRILLATION?:[id number]}     STOP-Bang Score:  5  { Consider Dx Sleep Disordered Breathing or Sleep Apnea  ICD G47.33          :1}     Physical Exam:   VS:  BP 132/80   Pulse 78   Ht 5' 3 (1.6 m)   Wt 233 lb 9.6 oz (106 kg)   SpO2 98%   BMI 41.38 kg/m    Wt Readings from Last 3 Encounters:  01/04/24 233 lb 9.6 oz (106 kg)  12/27/23 232 lb 3.2 oz (105.3 kg)  11/10/23 231 lb 4.8 oz (104.9 kg)     GEN: Well nourished, well developed in no acute distress NECK: No JVD CARDIAC: {EPRHYTHM:28826}, no murmurs, rubs, gallops RESPIRATORY:  Clear to auscultation without rales, wheezing or rhonchi  ABDOMEN: Soft, non-distended EXTREMITIES:  No edema; No deformity   ASSESSMENT AND PLAN:   Assessment and Plan Assessment & Plan       Follow up with {EPMDS:28135::EP Team} {EPFOLLOW LE:71826}  Signed, Fonda Kitty, MD  "

## 2024-01-04 NOTE — Patient Instructions (Signed)
 Medication Instructions:  Your physician recommends that you continue on your current medications as directed. Please refer to the Current Medication list given to you today.  *If you need a refill on your cardiac medications before your next appointment, please call your pharmacy*  Testing/Procedures: Loop Recorder Implant - this will be done at your next office visit. There are no restrictions or special instructions prior to this visit. Please note that you will not be able to shower for 72 hours after the procedure.   Follow-Up: At [facility], you and your health needs are our priority.  As part of our continuing mission to provide you with exceptional heart care, our providers are all part of one team.  This team includes your primary Cardiologist (physician) and Advanced Practice Providers or APPs (Physician Assistants and Nurse Practitioners) who all work together to provide you with the care you need, when you need it.  Your next appointment:   We will contact you to schedule your next appointment

## 2024-01-09 ENCOUNTER — Other Ambulatory Visit: Payer: Self-pay | Admitting: Family Medicine

## 2024-01-12 NOTE — Telephone Encounter (Signed)
**Note De-Identified Kamika Goodloe Obfuscation** I called [employer] and started a NPSG PA over the phone with Donie. Pending Authorization #: [id number]  Per Shirline, I have faxed clinicals to them at [phone]. I did receive confirmation that the fax sent successfully.

## 2024-01-14 ENCOUNTER — Other Ambulatory Visit: Payer: Self-pay | Admitting: Family Medicine

## 2024-01-20 ENCOUNTER — Other Ambulatory Visit: Payer: Self-pay | Admitting: Family Medicine

## 2024-01-20 NOTE — Telephone Encounter (Addendum)
**Note De-Identified Damani Rando Obfuscation** I called [employer] and s/w Jhoniet C who advised me that they have approved this NPSG PA from 03/13/2024-03/12/2025. Authorization # is [id number]  I have transferred the order to the sleep lab.  I called the pt and made her aware of this NPSG PA approval and I gave her the sleep labs phone number so she can call them to be scheduled.  She verbalized understanding to all information given and thanked me for my call.

## 2024-01-21 NOTE — Telephone Encounter (Signed)
 LAST OV 12/27/23 LAST FILLED QTY DAY SUPPLY NEXT OV PROVIDER

## 2024-03-09 ENCOUNTER — Ambulatory Visit: Admitting: Cardiology

## 2024-03-21 ENCOUNTER — Ambulatory Visit: Admitting: Cardiology

## 2024-03-27 ENCOUNTER — Ambulatory Visit: Admitting: Family Medicine
